# Patient Record
Sex: Female | Born: 1950 | Race: Black or African American | Hispanic: No | Marital: Married | Smoking: Never smoker
Health system: Southern US, Community
[De-identification: ages and names within clinical notes are randomized; demographics above are authoritative.]

## PROBLEM LIST (undated history)

## (undated) DIAGNOSIS — I10 Essential (primary) hypertension: Secondary | ICD-10-CM

## (undated) DIAGNOSIS — G4730 Sleep apnea, unspecified: Secondary | ICD-10-CM

## (undated) DIAGNOSIS — J3489 Other specified disorders of nose and nasal sinuses: Secondary | ICD-10-CM

## (undated) DIAGNOSIS — J40 Bronchitis, not specified as acute or chronic: Secondary | ICD-10-CM

## (undated) DIAGNOSIS — R059 Cough, unspecified: Secondary | ICD-10-CM

## (undated) DIAGNOSIS — N189 Chronic kidney disease, unspecified: Secondary | ICD-10-CM

## (undated) DIAGNOSIS — E119 Type 2 diabetes mellitus without complications: Secondary | ICD-10-CM

## (undated) DIAGNOSIS — J309 Allergic rhinitis, unspecified: Secondary | ICD-10-CM

## (undated) DIAGNOSIS — H269 Unspecified cataract: Secondary | ICD-10-CM

## (undated) DIAGNOSIS — E559 Vitamin D deficiency, unspecified: Secondary | ICD-10-CM

## (undated) DIAGNOSIS — R05 Cough: Secondary | ICD-10-CM

## (undated) DIAGNOSIS — M1990 Unspecified osteoarthritis, unspecified site: Secondary | ICD-10-CM

## (undated) DIAGNOSIS — R202 Paresthesia of skin: Secondary | ICD-10-CM

## (undated) DIAGNOSIS — IMO0001 Reserved for inherently not codable concepts without codable children: Secondary | ICD-10-CM

## (undated) DIAGNOSIS — M810 Age-related osteoporosis without current pathological fracture: Secondary | ICD-10-CM

## (undated) DIAGNOSIS — M159 Polyosteoarthritis, unspecified: Secondary | ICD-10-CM

## (undated) DIAGNOSIS — R350 Frequency of micturition: Secondary | ICD-10-CM

## (undated) HISTORY — DX: Unspecified osteoarthritis, unspecified site: M19.90

## (undated) HISTORY — DX: Other specified disorders of nose and nasal sinuses: J34.89

## (undated) HISTORY — DX: Vitamin D deficiency, unspecified: E55.9

## (undated) HISTORY — PX: ABDOMINAL HYSTERECTOMY: SHX81

## (undated) HISTORY — DX: Allergic rhinitis, unspecified: J30.9

## (undated) HISTORY — DX: Type 2 diabetes mellitus without complications: E11.9

## (undated) HISTORY — DX: Chronic kidney disease, unspecified: N18.9

## (undated) HISTORY — DX: Essential (primary) hypertension: I10

## (undated) HISTORY — DX: Polyosteoarthritis, unspecified: M15.9

## (undated) HISTORY — DX: Age-related osteoporosis without current pathological fracture: M81.0

---

## 2007-04-19 HISTORY — PX: KNEE SURGERY: SHX244

## 2007-12-21 ENCOUNTER — Inpatient Hospital Stay (HOSPITAL_COMMUNITY): Admission: RE | Admit: 2007-12-21 | Discharge: 2007-12-26 | Payer: Self-pay | Admitting: Orthopedic Surgery

## 2008-01-17 ENCOUNTER — Ambulatory Visit: Payer: Self-pay | Admitting: Vascular Surgery

## 2008-01-17 ENCOUNTER — Encounter (INDEPENDENT_AMBULATORY_CARE_PROVIDER_SITE_OTHER): Payer: Self-pay | Admitting: Orthopedic Surgery

## 2008-01-17 ENCOUNTER — Ambulatory Visit (HOSPITAL_COMMUNITY): Admission: RE | Admit: 2008-01-17 | Discharge: 2008-01-17 | Payer: Self-pay | Admitting: Orthopedic Surgery

## 2010-02-06 ENCOUNTER — Encounter: Admission: RE | Admit: 2010-02-06 | Discharge: 2010-02-06 | Payer: Self-pay | Admitting: Orthopedic Surgery

## 2010-08-31 NOTE — Op Note (Signed)
NAMELAZARIAH, SAVARD NO.:  [id number]   MEDICAL RECORD NO.:  [id number]          PATIENT TYPE:  INP   LOCATION:  5040                         FACILITY:  [facility]   PHYSICIAN:  Dyke Brackett, M.D.    DATE OF BIRTH:  Dec 08, 1950   DATE OF PROCEDURE:  12/21/2007  DATE OF DISCHARGE:                               OPERATIVE REPORT   INDICATIONS:  A 60 year old with end-stage arthritis, left knee, not  responding to conservative treatment, thought to be amenable to  hospitalization and left total knee replacement.   PREOPERATIVE DIAGNOSIS:  Osteoarthritis, left knee.   POSTOPERATIVE DIAGNOSIS:  Osteoarthritis, left knee.   OPERATIONS:  1. Left total knee replacement (LCS cemented standard plus size 4      tibia, 50-mm bearing, 3 peg patella).  2. Open lateral release off the left knee.   SURGEON:  Dyke Brackett, MD   ASSISTANT:  Sharol Given, PA   TOURNIQUET TIME:  1 hour 30 minutes.   DESCRIPTION OF PROCEDURE:  After sterile prep and drape, exsanguination  of the leg, inflation to 400 mmHg, straight skin incision a medial  parapatellar approach to the right knee made stripping of the medial  side due to varus knee.  The tibia was cut about 2-3 mm below the most  diseased medial compartment which released the PCL followed by an  anterior and posterior femoral cut with a flexion gap measured at 15 mm  with a  4-degree valgus.  Distal femoral cut measured again with an  extension gap of 15 mm.  The chamfer cuts were made as well with again  excision of the remnants of the menisci medially and laterally and  further release of the PCL.  Attention was next directed the tibia.  The  tibia keel cut was made with standard technique.  The trial was placed  on the tibia followed by femur.  Patella was cut leaving about 13 mm of  native patella.  Trial reduction carried out with full extension noted.  Good improvement in relative to both flexion contraction noted.  Good  stability to varus and valgus stress testing.  Trial components were  removed followed by copious irrigation.  Final components were inserted  with 1.2 grams of tobramycin per batch of cement.  With cement allowed  to harden, trial bearing was then placed.  Trial bearing was removed.  Tourniquet was released.  No excessive bleeding was noted from the  posterior aspect of the knee, but multiple bits of the cement were  removed.  Final components were inserted including the final bearing and  final reduction and then again full extension.  No instability.  Good  stability to the varus and valgus stress test.  Patella, however, did  seem to track laterally, so an open lateral release was carried out  which improved the tracking  significantly and then closure of the capsule was accomplished with #1  Ethibond, 2-0 Vicryl, and skin clips.  Marcaine with epinephrine in the  skin as well as into the joint.  The patient was taken to the recovery  room  in stable condition.      Dyke Brackett, M.D.  Electronically Signed     WDC/MEDQ  D:  12/21/2007  T:  12/22/2007  Job:  [id number]

## 2010-08-31 NOTE — Discharge Summary (Signed)
NAMESAMIYYAH, Leon             ACCOUNT NO.:  [id number]   MEDICAL RECORD NO.:  [id number]          PATIENT TYPE:  INP   LOCATION:  5040                         FACILITY:  [facility]   PHYSICIAN:  Dyke Brackett, M.D.    DATE OF BIRTH:  Oct 29, 1950   DATE OF ADMISSION:  12/21/2007  DATE OF DISCHARGE:                               DISCHARGE SUMMARY   ADMITTING DIAGNOSIS:  Left knee osteoarthritis.   DISCHARGE DIAGNOSIS:  Left knee osteoarthritis.   PROCEDURE:  Left total knee arthroplasty.   SURGEON:  Dr. Madelon Lips.   ASSISTANT:  Sharol Given, PA-C.   Emily Leon came in on December 21, 2007, with left knee osteoarthritis  ongoing, failed conservative treatment and wished to proceed forward  with left total knee replacement which was done in the operating room  and she was transferred to the PACU in stable condition.   HOSPITAL COURSE:  Postoperative day #1 was on December 22, 2007.  The  patient did well, pain controlled slow with therapy.  Vitals were  stable.  Hemoglobin 11.0 and hematocrit 32.8.  She was not having any  calf problems and was able to get rid of the PCA.  The patient was also  consulted by internal medicine on December 22, 2007, due to poorly  controlled blood sugar in the past as her hemoglobin A1c was 10.6 coming  in, which was controlled by internal medicine and helped control her  diabetes.  It was also noted she had some acute renal insufficiency,  which they thought was secondary to dehydration, which they helped out  with controlling.  The patient's progress was slow, so they suggested  skilled nursing facility.  Discussed with the patient and she agreed.  December 23, 2007, was postoperative day #2, and the patient was doing  well.  Hemoglobin 11.0, hematocrit 32.8 from the prior day as they did  not have the new hemoglobin and hematocrit at that point.  Medicine had  been following the patient.  No significant drainage from her wound that  was  __________  from her left knee.  The patient otherwise doing well  except slow with therapy.  Postoperative day #3 was on December 24, 2007.  The patient was still slow with therapy.  As noted, she was  agreeable to skilled nursing facility.  Labs were stable.  Hemoglobin  was 9.6, hematocrit was 28.4.  Still working strong with therapies, but  still slow.  Plan on discharging to skilled nursing facility.  General  medicine noted that the diabetes was well controlled and hypertension  was stable.  Postoperative day #4 was on December 25, 2007.  The  patient hemoglobin was down to 8.6.  She states she had some problems  with dizziness when she stood up.  I talked her today also about her  blood pressure being slightly low at 90/60 and suggested a blood  transfusion.  Therefore, we decide to transfuse 2 units of packed red  blood cells.  The patient tolerated it well.  Then suggested transfer to  the skilled nursing facility when available.  Discharge note  given.  Plans for discharge to skilled nursing facility either on December 25, 2007 or on December 26, 2007, when a bed available.  Examination on  December 25, 2007, the patient still had not had a bowel movement, but  passing gas and doing well.  Vitals overall were stable.  The wound was  clean with no known drainage and left lower extremity neurovascularly  intact.  Not a strong amount of knee flexion and extension and the  patient was having difficulty with the pain in her knee.   DISCHARGE MEDICATIONS:  1. Apidra insulin pen where she was getting  18 units subcu at lunch      and dinner.  2. Lantus insulin, she is getting 50 units subcu at bedtime.  3. Hydrochlorothiazide 25 mg 1/2 tablet per day.  4. Vytorin 10/80 mg 1 tablet per day.  5. Actos 30 mg 1 tablet daily.  6. Glimepiride 4 mg 1 tablet daily.  7. Atenolol 100 mg 1 tablet daily.  8. Lotrel 10/40 mg 1 tablet daily.  9. Percocet 5/325 one-to-two tablets p.o. q.4-6  h., p.r.n. pain.  10.Robaxin 500 mg 1 tablet q.6-8 h., p.r.n. pain, muscle spasm.  11.Lovenox 40 mg subcu at 8 a.m. for another 10 days.  Plan for      patient to be discharged on Lovenox with the last day being on      January 04, 2008.   In addition of assessment/plan and discharge summary; Emily Leon is 4 days  postop left total knee replacement, going slow with therapy, so  transferred to skilled nursing facility.  She will be CPM as much as 0  to 90, if not at 90 degrees, increase 3-5 degrees per day.  The patient  50% weightbearing left lower extremity.  Diet will be diabetic diet.   ALLERGIES:  1. PENICILLIN.  2. SULFA.   The patient in stable condition on transfer.  Plan for 14 days postop  for staple removal and expect her for recheck in the office in about 2  weeks from discharge, which will be right around January 09, 2008.  The patient's staples should be removed approximately on January 04, 2008, from her left knee.  Please notify the office for any temperature  over 101 degrees or any signs of infection involving her left knee.      Sharol Given, PA      Dyke Brackett, M.D.  Electronically Signed    JBS/MEDQ  D:  12/25/2007  T:  12/25/2007  Job:  [id number]

## 2010-08-31 NOTE — Consult Note (Signed)
Emily Leon, Emily Leon             ACCOUNT NO.:  [id number]   MEDICAL RECORD NO.:  [id number]          PATIENT TYPE:  INP   LOCATION:  5040                         FACILITY:  [facility]   PHYSICIAN:  Renee Ramus, MD       DATE OF BIRTH:  02-11-1951   DATE OF CONSULTATION:  12/21/2007  DATE OF DISCHARGE:                                 CONSULTATION   HISTORY OF PRESENT ILLNESS:  The patient is a 60 year old female  admitted for left TKA secondary to advancing osteoarthritis.  We are  called to see the patient secondary to management of diabetes, high  blood pressure, and protracted nausea.   PAST MEDICAL HISTORY:  1. Advancing osteoarthritis.  2. Obesity.  3. Diabetes.  4. Hypertension.  5. Hyperlipidemia.   ALLERGIES:  PENICILLIN and SULFA.   CURRENT MEDICATIONS:  1. Norvasc.  2. Tenormin.  3. Benazepril.  4. Vytorin.  5. Amaryl.  6. Hydrochlorothiazide.  7. Dilaudid.  8. Aspart.  9. Lantus.   FAMILY HISTORY:  Not available.   SOCIAL HISTORY:  The patient is married, lives with her husband.  She  has no alcohol or tobacco history.   REVIEW OF SYSTEMS:  All other comprehensive review of systems are  negative.   PHYSICAL EXAMINATION:  GENERAL:  Well-developed, well-nourished,  somewhat obese [place] American female.  VITAL SIGNS:  Currently stable.  Temperature 98.7, pulse 68, respiratory  rate 18, blood pressure 150/72.  HEENT:  No jugular venous distention.  No lymphadenopathy.  Oropharynx  is clear.  Mucous membranes pink and moist.  TMs  clear bilaterally.  Pupils equal and reactive to light and accommodation.  Extraocular  movements were intact.  CARDIOVASCULAR:  Regular rate and rhythm without murmur rubs or gallops.  PULMONARY:  Lungs are clear to auscultation bilaterally.  ABDOMEN:  Soft, nontender, and nondistended without hepatosplenomegaly.  Bowel sounds are present.  She has no rebound or guarding.  EXTREMITIES:  She has no clubbing, cyanosis or edema.   Her left leg is  mobilized, somewhat small, but incision is clean, dry, and intact.  NEUROLOGIC:  Cranial nerves II-XII are grossly intact.  She has no focal  neurological deficits.   LABORATORY DATA:  Sodium 135, potassium 3.7, chloride 99, bicarb 28, BUN  6, creatinine 1.0, and glucose 206.  White count 7.9, H&H 13 and 40, MCV  90, and platelets 244.   ASSESSMENT AND PLAN:  1. Status post total knee arthroplasty per Ortho.  2. Protracted nausea.  We will treat with Zofran initially and monitor      for results.  3. Diabetes mellitus.  Place the patient on sliding scale insulin and      continue Lantus and aspart.  4. Hypertension.  We will monitor vital signs and adjust medications      as needed.  5. Hyperlipidemia.  Currently stable.  Continue statin therapy.  6. Disposition.  We will continue to follow the patient with you.   Consult note was constructed by reviewing past medical history  conferring with the orthopedic surgeon and reviewing the current medical  record.  Time spent  1.5 hours.      Renee Ramus, MD  Electronically Signed     JF/MEDQ  D:  12/21/2007  T:  12/22/2007  Job:  [id number]

## 2010-10-26 ENCOUNTER — Other Ambulatory Visit: Payer: Self-pay | Admitting: Orthopedic Surgery

## 2010-10-26 DIAGNOSIS — M25511 Pain in right shoulder: Secondary | ICD-10-CM

## 2010-11-03 ENCOUNTER — Other Ambulatory Visit: Payer: Self-pay

## 2010-11-13 ENCOUNTER — Ambulatory Visit
Admission: RE | Admit: 2010-11-13 | Discharge: 2010-11-13 | Disposition: A | Discharge status: Home / Self Care | Payer: Medicare Other | Source: Ambulatory Visit | Attending: Orthopedic Surgery | Admitting: Orthopedic Surgery

## 2010-11-13 DIAGNOSIS — M25511 Pain in right shoulder: Secondary | ICD-10-CM

## 2011-01-19 LAB — GLUCOSE, CAPILLARY
Glucose-Capillary: 100 — ABNORMAL HIGH
Glucose-Capillary: 102 — ABNORMAL HIGH
Glucose-Capillary: 102 — ABNORMAL HIGH
Glucose-Capillary: 153 — ABNORMAL HIGH
Glucose-Capillary: 155 — ABNORMAL HIGH
Glucose-Capillary: 195 — ABNORMAL HIGH
Glucose-Capillary: 197 — ABNORMAL HIGH
Glucose-Capillary: 207 — ABNORMAL HIGH
Glucose-Capillary: 258 — ABNORMAL HIGH
Glucose-Capillary: 282 — ABNORMAL HIGH
Glucose-Capillary: 82
Glucose-Capillary: 98

## 2011-01-19 LAB — CROSSMATCH: ABO/RH(D): O POS

## 2011-01-19 LAB — CBC
HCT: 25.5 — ABNORMAL LOW
Hemoglobin: 11 — ABNORMAL LOW
Hemoglobin: 9.6 — ABNORMAL LOW
MCHC: 32.7
MCHC: 33.6
MCHC: 33.9
MCV: 90.3
Platelets: 203
Platelets: 239
Platelets: 259
RBC: 3.47 — ABNORMAL LOW
RDW: 13.6
RDW: 13.9
RDW: 14.1
WBC: 9.1

## 2011-01-19 LAB — BASIC METABOLIC PANEL
BUN: 10
BUN: 13
BUN: 22
CO2: 26
CO2: 30
Calcium: 8.2 — ABNORMAL LOW
Calcium: 8.5
Chloride: 99
Creatinine, Ser: 1.69 — ABNORMAL HIGH
GFR calc Af Amer: >60
GFR calc non Af Amer: 59 — ABNORMAL LOW
Glucose, Bld: 177 — ABNORMAL HIGH
Glucose, Bld: 89
Potassium: 3.3 — ABNORMAL LOW
Sodium: 136
Sodium: 139

## 2011-01-19 LAB — COMPREHENSIVE METABOLIC PANEL
AST: 47 — ABNORMAL HIGH
Albumin: 3 — ABNORMAL LOW
Alkaline Phosphatase: 74
BUN: 16
CO2: 27
Chloride: 100
Creatinine, Ser: 2.31 — ABNORMAL HIGH
GFR calc non Af Amer: 22 — ABNORMAL LOW
Potassium: 4.2
Sodium: 137
Total Bilirubin: 0.6

## 2011-01-19 LAB — HEMOGLOBIN AND HEMATOCRIT, BLOOD
HCT: 30.9 — ABNORMAL LOW
Hemoglobin: 10.6 — ABNORMAL LOW

## 2013-11-14 ENCOUNTER — Encounter (INDEPENDENT_AMBULATORY_CARE_PROVIDER_SITE_OTHER): Payer: Self-pay | Admitting: General Surgery

## 2013-11-14 ENCOUNTER — Ambulatory Visit (INDEPENDENT_AMBULATORY_CARE_PROVIDER_SITE_OTHER): Payer: BC Managed Care – PPO | Admitting: General Surgery

## 2013-11-14 ENCOUNTER — Other Ambulatory Visit (INDEPENDENT_AMBULATORY_CARE_PROVIDER_SITE_OTHER): Payer: Self-pay | Admitting: General Surgery

## 2013-11-14 VITALS — BP 127/79 | HR 82 | Temp 98.2°F | Resp 16 | Ht 69.0 in | Wt 307.4 lb

## 2013-11-14 DIAGNOSIS — E1165 Type 2 diabetes mellitus with hyperglycemia: Secondary | ICD-10-CM

## 2013-11-14 DIAGNOSIS — IMO0001 Reserved for inherently not codable concepts without codable children: Secondary | ICD-10-CM

## 2013-11-14 NOTE — Progress Notes (Signed)
Patient ID: Emily Leon, female   DOB: 1950-06-08, 63 y.o.   MRN: [id number]  Chief Complaint  Patient presents with  . Bariatric Follow Up    HPI Emily Leon is a 63 y.o. female.   HPI 63 yo morbidly obese AAF referred by Alinda DeemJANNACH,STEPHEN, MD for evaluation of weight loss surgery.  The patient has struggled with her weight for many years. Despite several attempts for sustained weight loss she has been unsuccessful. She has tried Weight Watchers on several occasions, low carbohydrate diets, Slim fast as well as working out-all without any long-term success.  She is most interested in improving her diabetes mellitus thru weight loss surgery. She is debating between the laparoscopic adjustable gastric band versus a sleeve gastrectomy. She attended our in person seminar.  Her diabetes is managed by [place]. [place] Past Medical History  Diagnosis Date  . Arthritis   . Diabetes mellitus without complication   . Hypertension   . Chronic kidney disease     Past Surgical History  Procedure Laterality Date  . Knee surgery Left 2009    History reviewed. No pertinent family history.  Social History History  Substance Use Topics  . Smoking status: Never Smoker   . Smokeless tobacco: Not on file  . Alcohol Use: No    Allergies  Allergen Reactions  . Penicillins   . Sulfa Antibiotics     Current Outpatient Prescriptions  Medication Sig Dispense Refill  . amLODipine-atorvastatin (CADUET) 10-40 MG per tablet Take 1 tablet by mouth daily.      Marland Kitchen. aspirin 81 MG tablet Take 81 mg by mouth daily.      Marland Kitchen. atenolol (TENORMIN) 100 MG tablet Take 100 mg by mouth daily.      Marland Kitchen. ezetimibe-simvastatin (VYTORIN) 10-40 MG per tablet Take 1 tablet by mouth daily.      . hydrochlorothiazide (HYDRODIURIL) 25 MG tablet Take 25 mg by mouth daily.      . insulin aspart (NOVOLOG) 100 UNIT/ML injection Inject into the skin 3 (three) times daily before meals.      . insulin glargine  (LANTUS) 100 UNIT/ML injection Inject into the skin at bedtime.       No current facility-administered medications for this visit.    Review of Systems Review of Systems  Constitutional: Negative for fever, activity change, appetite change and unexpected weight change.  HENT: Negative for nosebleeds and trouble swallowing.   Eyes: Negative for photophobia and visual disturbance.  Respiratory: Negative for chest tightness and shortness of breath.        +OSA on CPAP, epworth score 9  Cardiovascular: Negative for chest pain and leg swelling (some Left ankle edema).       Denies CP, SOB, orthopnea, PND, some DOE  Gastrointestinal: Negative for nausea, vomiting, abdominal pain, diarrhea and constipation.       Denies reflux, reports normal colonoscopy  Endocrine:       Reports being diabetic for at least 30 years  Genitourinary: Negative for dysuria and difficulty urinating.       Vaginal hysterectomy  Musculoskeletal: Negative for arthralgias.       Bilateral knee pain  Skin: Negative for pallor and rash.  Neurological: Negative for dizziness, seizures, facial asymmetry and numbness.       Denies TIA and amaurosis fugax; remote history of migraines   Hematological: Negative for adenopathy. Does not bruise/bleed easily.  Psychiatric/Behavioral: Negative for behavioral problems and agitation.    Blood pressure 127/79, pulse  82, temperature 98.2 F (36.8 C), temperature source Oral, resp. rate 16, height 5\' 9"  (1.753 m), weight 307 lb 6.4 oz (139.436 kg).  Physical Exam Physical Exam  Vitals reviewed. Constitutional: She is oriented to person, place, and time. She appears well-developed and well-nourished. No distress.  Morbidly obese, central obesity  HENT:  Head: Normocephalic and atraumatic.  Right Ear: External ear normal.  Left Ear: External ear normal.  Eyes: Conjunctivae are normal. No scleral icterus.  Neck: Normal range of motion. Neck supple. No tracheal deviation  present. No thyromegaly present.  Cardiovascular: Normal rate and normal heart sounds.   Pulmonary/Chest: Effort normal and breath sounds normal. No stridor. No respiratory distress. She has no wheezes.  Abdominal: Soft. She exhibits no distension. There is no tenderness. There is no rebound.  Musculoskeletal: She exhibits no edema and no tenderness.  Lymphadenopathy:    She has no cervical adenopathy.  Neurological: She is alert and oriented to person, place, and time. She exhibits normal muscle tone.  Skin: Skin is warm and dry. No rash noted. She is not diaphoretic. No erythema.  Psychiatric: She has a normal mood and affect. Her behavior is normal. Judgment and thought content normal.    Data Reviewed None available  Assessment    Morbid obesity BMI 45.39 Obstructive sleep apnea on CPAP Hypertension Hyperlipidemia Osteoarthritis with bilateral knee pain Insulin-dependent diabetes mellitus     Plan    The patient meets weight loss surgery criteria. I think the patient would be an acceptable candidate for Laparoscopic vertical sleeve gastrectomy or laparoscopic adjustable gastric band procedure.   We discussed laparoscopic sleeve gastrectomy. We discussed the preoperative, operative and postoperative process. Using diagrams, I explained the surgery in detail including the performance of an EGD near the end of the surgery and an Upper GI swallow study on POD 1. We discussed the typical hospital course including a 2-3 day stay baring any complications.   The patient was given educational material. I quoted the patient that most patients can lose up to 50-70% of their excess weight. We did discuss the possibility of weight regain several years after the procedure.  The risks of infection, bleeding, pain, scarring, weight regain, too little or too much weight loss, vitamin deficiencies and need for lifelong vitamin supplementation, hair loss, need for protein supplementation, leaks,  stricture, reflux, food intolerance, gallstone formation, hernia, need for reoperation and conversion to roux Y gastric bypass, need for open surgery, injury to spleen or surrounding structures, DVT's, PE, and death again discussed with the patient and the patient expressed understanding and desires to proceed with laparoscopic vertical sleeve gastrectomy, possible open, intraoperative endoscopy.  We then discussed laparoscopic adjustable gastric banding. The patient was given Agricultural engineer. We discussed the risk and benefits of surgery including but not limited to bleeding, infection, injury to surrounding structures, blood clot formation such as deep venous thrombosis or pulmonary embolism, need to convert to an open procedure, band slippage, band erosion, failure to loose weight, port complications (leak or flippage), potential need for reoperative surgery, esophageal dilatation, worsening reflux, and vitamin deficiencies.  We also discussed the typical postoperative course with a laparoscopic adjustable gastric band and the need for frequent postoperative visits to assess the volume status of the band.  We discussed the typical expected weight loss with a laparoscopic adjustable gastric band. I explained to the patient that they can expect to lose 40-60% of their excess body weight if they are compliant with their postoperative instructions. However  I did explain that some patients loose less than 40% and some patients lose more than 60% of their excess body weight.  I explained that the likelihood of improvement in their obesity is fair with LAGB.  We discussed that before and after either surgery that there would be an alteration in their diet. I explained that we have put them on a diet 2 weeks before surgery. I also explained that they would be on a liquid diet for 2 weeks after surgery. We discussed that they would have to avoid certain foods after surgery. We discussed the importance of physical  activity as well as compliance with our dietary and supplement recommendations and routine follow-up.  I explained that if her primary goal is to improve her diabetes that more than likely she would be best served by a sleeve gastrectomy. I explained that we did not have to make a decision today. I wanted her to go home and process that information we discussed as well as read over the material I was providing her today.  I explained to the patient that we will start our evaluation process which includes labs, Upper GI to evaluate stomach and swallowing anatomy, nutritionist consultation, psychiatrist consultation, EKG, CXR, abdominal ultrasound.  The patient was given EMMI video access on sleeve gastrectomy & LAGB. Since she does not have Internet access, we will try to arrange for her to view the videos when she goes for her nutrition consultation at the hospital  [name]. [name], MD, FACS General, Bariatric, & Minimally Invasive Surgery [facility]  Note: This dictation was prepared with Dragon/digital dictation along with [facility] technology. Any transcriptional errors that result from this process are unintentional.          [name] 11/14/2013, 7:33 PM

## 2013-11-14 NOTE — Patient Instructions (Signed)
Congratulations on starting your journey to a healthier life! Over the next few weeks you will be undergoing tests (x-rays and labs) and seeing specialists to help evaluate you for weight loss surgery.  These tests and consultations with a psychologist and nutritionist are needed to prepare you for the lifestyle changes that lie ahead and are often required by insurance companies to approve you for surgery.   Pathway to Surgery:  Over the next few weeks -->Lab work -->Radiology tests   - Chest x-ray - make sure your lungs are normal before surgery  - Upper GI - you drink barium and pictures are taken as it travels down your  esophagus and into your stomach - looks for reflux and a hiatal hernia which may  need to repaired at the same time as your weight loss surgery  - Abdominal Ultrasound - looks at your gallbladder and liver  - Mammogram - up to date mammogram if you are a female -->EKG  -->Sleep study - if you are felt to be at high risk for obstructive sleep apnea -->H. Pylori breath test ([employer]) - you surgeon may order this test to see if you have  a bacteria (H pylori) in your stomach which makes you at higher risk to develop a  ulcer or inflammation of your stomach -->Nutrition consultation -->Psychologist consultation -->Other specialist consults - your surgeon may determine that you need to see a  specialist like a cardiologist or pulmnologist depending on your health history -->Watch EMMI video about your planned weight loss surgery (we will try to coordinate this when you go to [facility] for your nutrition consult) -->AttenWonda Oldsd the support group meeting at [facility] -->you can look at [email or web address] to learn more about weight loss surgery and  compare surgery outcomes -->you can look at our new website - [email or web address] - available mid-August 2015  Two weeks prior to surgery  Go on the extremely low carb liquid diet - this will decrease the size of your liver  which will  make surgery safer - the nutritionist will go over this at a later date  Attend preoperative appointment with your surgeon  Attend preoperative surgery class  One week prior to surgery  No aspirin products.  Tylenol is acceptable   24 hours prior to surgery  No alcoholic beverages  Report fever greater than 100.5 or excessive nasal drainage suggesting infection  Continue bariatric preop diet  Perform bowel prep if ordered  Do not eat or drink anything after midnight the night before surgery  Do not take any medications except those instructed by the anesthesiologist  Morning of surgery  Please arrive at the hospital at least 2 hours before your scheduled surgery time.  No makeup, fingernail polish or jewelry  Bring insurance cards with you  Bring your CPAP mask if you use this

## 2013-12-19 ENCOUNTER — Ambulatory Visit (HOSPITAL_COMMUNITY)
Admission: RE | Admit: 2013-12-19 | Discharge: 2013-12-19 | Disposition: A | Discharge status: Home / Self Care | Payer: Medicare Other | Source: Ambulatory Visit | Attending: General Surgery | Admitting: General Surgery

## 2013-12-19 ENCOUNTER — Ambulatory Visit (HOSPITAL_COMMUNITY): Payer: Medicare Other

## 2013-12-19 ENCOUNTER — Other Ambulatory Visit (INDEPENDENT_AMBULATORY_CARE_PROVIDER_SITE_OTHER): Payer: Self-pay | Admitting: General Surgery

## 2013-12-19 ENCOUNTER — Other Ambulatory Visit (HOSPITAL_COMMUNITY): Payer: Medicare Other

## 2013-12-19 DIAGNOSIS — K7689 Other specified diseases of liver: Secondary | ICD-10-CM | POA: Insufficient documentation

## 2013-12-19 DIAGNOSIS — Z1231 Encounter for screening mammogram for malignant neoplasm of breast: Secondary | ICD-10-CM | POA: Diagnosis not present

## 2013-12-19 DIAGNOSIS — Z6841 Body Mass Index (BMI) 40.0 and over, adult: Secondary | ICD-10-CM | POA: Diagnosis not present

## 2013-12-19 DIAGNOSIS — E1165 Type 2 diabetes mellitus with hyperglycemia: Secondary | ICD-10-CM

## 2013-12-19 DIAGNOSIS — K8020 Calculus of gallbladder without cholecystitis without obstruction: Secondary | ICD-10-CM | POA: Diagnosis not present

## 2013-12-19 DIAGNOSIS — G4733 Obstructive sleep apnea (adult) (pediatric): Secondary | ICD-10-CM | POA: Diagnosis not present

## 2013-12-19 DIAGNOSIS — E785 Hyperlipidemia, unspecified: Secondary | ICD-10-CM | POA: Diagnosis not present

## 2013-12-19 DIAGNOSIS — M129 Arthropathy, unspecified: Secondary | ICD-10-CM | POA: Diagnosis not present

## 2013-12-19 DIAGNOSIS — IMO0001 Reserved for inherently not codable concepts without codable children: Secondary | ICD-10-CM | POA: Diagnosis not present

## 2013-12-19 DIAGNOSIS — I517 Cardiomegaly: Secondary | ICD-10-CM | POA: Insufficient documentation

## 2013-12-19 DIAGNOSIS — I129 Hypertensive chronic kidney disease with stage 1 through stage 4 chronic kidney disease, or unspecified chronic kidney disease: Secondary | ICD-10-CM | POA: Diagnosis not present

## 2013-12-19 DIAGNOSIS — N189 Chronic kidney disease, unspecified: Secondary | ICD-10-CM | POA: Diagnosis not present

## 2013-12-26 ENCOUNTER — Encounter: Payer: Medicare Other | Attending: General Surgery | Admitting: Dietician

## 2013-12-26 ENCOUNTER — Encounter: Payer: Self-pay | Admitting: Dietician

## 2013-12-26 VITALS — Ht 69.0 in | Wt 307.2 lb

## 2013-12-26 DIAGNOSIS — Z6841 Body Mass Index (BMI) 40.0 and over, adult: Secondary | ICD-10-CM | POA: Insufficient documentation

## 2013-12-26 DIAGNOSIS — Z713 Dietary counseling and surveillance: Secondary | ICD-10-CM | POA: Insufficient documentation

## 2013-12-26 DIAGNOSIS — Z01818 Encounter for other preprocedural examination: Secondary | ICD-10-CM | POA: Diagnosis not present

## 2013-12-26 NOTE — Patient Instructions (Signed)
Follow Pre-Op Goals Try Protein Shakes Return for 6 Months of Supervised Weight Loss. Call [facility] at [phone] when surgery is scheduled to enroll in Pre-Op Class

## 2013-12-26 NOTE — Progress Notes (Signed)
  Pre-Op Assessment Visit:  Pre-Operative Sleeve Gastrectomy Surgery  Medical Nutrition Therapy:  Appt start time: 0910   End time:  0945.  Patient was seen on 12/26/2013 for Pre-Operative Sleeve Gastrectomy Nutrition Assessment. Assessment and letter of approval faxed to [facility] Surgery Bariatric Surgery Program coordinator on 12/26/2013.   Preferred Learning Style:   No preference indicated   Learning Readiness:   Ready  Handouts given during visit include:  Pre-Op Goals Bariatric Surgery Protein Shakes  Teaching Method Utilized:  Visual Auditory Hands on  Barriers to learning/adherence to lifestyle change: arthritis pain  Demonstrated degree of understanding via:  Teach Back   [facility] to call the [facility] to enroll in Pre-Op and Post-Op Nutrition Education when surgery date is scheduled.

## 2013-12-31 ENCOUNTER — Encounter: Payer: Medicare Other | Admitting: Dietician

## 2013-12-31 VITALS — Ht 69.0 in | Wt 308.8 lb

## 2013-12-31 DIAGNOSIS — Z01818 Encounter for other preprocedural examination: Secondary | ICD-10-CM | POA: Diagnosis not present

## 2013-12-31 NOTE — Patient Instructions (Signed)
Work on phasing out caffeine and sugar in drinks. Drink mostly water, decaf tea with Splenda, or crystal light. Keep using small plate for meals. Keep trying protein shakes. Keep walking 3 x week for 30 minutes and increase when you can.  Have protein with carbohydrates at the same time if you need a snack.

## 2013-12-31 NOTE — Progress Notes (Signed)
  6 Months Supervised Weight Loss Visit:   Pre-Operative Sleeve Gastrectomy Surgery  Medical Nutrition Therapy:  Appt start time: 1200 end time:  1230.  Primary concerns today: Supervised Weight Loss Visit. Tried a protein shake and thought it was ok. Also tried chewing good and tried to walk a little bit more. Has been eating the same way for awhile. Does not snack.   Blood sugar is still high. Will have doctor's appointment next month to talk about blood sugar/insulin.   Weight: 308.8 lbs BMI: 45.6  24-hr recall: B (AM): protein shake and fruit Snk (AM): none  L (PM): salad with grilled chicken, tomatoes, lettuce, carrots, and cucumbers Snk (PM): none  D (PM): baked chicken or string beans  Snk (PM): none Bev: 1-2 Mountain Dew per week and unsweet tea and water with crystal light  Medications:  See list  Recent physical activity:  Walk 3 days a week for 30 minutes  Progress Towards Goal(s):  In progress.   Nutritional Diagnosis:  [place]-3.3 Overweight/obesity related to past poor dietary habits and physical inactivity as evidenced by patient plans for Sleeve Gastrectomy surgery following dietary guidelines for continued weight loss.    Intervention:  Nutrition counseling provided. Plan: Work on phasing out caffeine and sugar in drinks. Drink mostly water, decaf tea with Splenda, or crystal light. Keep using small plate for meals. Keep trying protein shakes. Keep walking 3 x week for 30 minutes and increase when you can.  Have protein with carbohydrates at the same time if you need a snack.   Handouts given out during visit 15 g CHO Snacks  Monitoring/Evaluation:  Dietary intake, exercise, and body weight. Follow up in 1 months for 6 month supervised weight loss visit.

## 2014-01-23 ENCOUNTER — Encounter: Payer: Medicare Other | Attending: General Surgery | Admitting: Dietician

## 2014-01-23 VITALS — Ht 69.0 in | Wt 309.5 lb

## 2014-01-23 DIAGNOSIS — Z6841 Body Mass Index (BMI) 40.0 and over, adult: Secondary | ICD-10-CM | POA: Diagnosis not present

## 2014-01-23 DIAGNOSIS — Z713 Dietary counseling and surveillance: Secondary | ICD-10-CM | POA: Insufficient documentation

## 2014-01-23 NOTE — Progress Notes (Signed)
  6 Months Supervised Weight Loss Visit:   Pre-Operative Sleeve Gastrectomy Surgery  Medical Nutrition Therapy:  Appt start time: 1200 end time:  1230.  Primary concerns today: Supervised Weight Loss Visit #2. Returns with no change in weight. States things are about the same. Tried a [employer] protein shake. Reminded her of the protein shakes requirements. Still trying to chew foods well. Still walking 3 x week.   Blood sugar is still high and will be seeing doctor next week.   Blood sugar is still high. Will have doctor's appointment next month to talk about blood sugar/insulin.   Weight: 308.8 lbs BMI: 45.6  24-hr recall: B (AM): protein shake and fruit Snk (AM): none  L (PM): salad with grilled chicken, tomatoes, lettuce, carrots, and cucumbers Snk (PM): none  D (PM): baked chicken or string beans  Snk (PM): none Bev: 1-2 Mountain Dew per week and unsweet tea and water with crystal light  Medications:  See list  Recent physical activity:  Walk 3 days a week for 30 minutes  Progress Towards Goal(s):  In progress.   Nutritional Diagnosis:  [place]-3.3 Overweight/obesity related to past poor dietary habits and physical inactivity as evidenced by patient plans for Sleeve Gastrectomy surgery following dietary guidelines for continued weight loss.    Intervention:  Nutrition counseling provided. Plan: Work on phasing out caffeine and sugar in drinks. Drink mostly water, decaf tea with Splenda, or crystal light. Keep using small plate for meals. Keep trying protein shakes. Keep walking 3 x week for 30 minutes and increase when you can.  Have protein with carbohydrates at the same time if you need a snack.   Handouts given out during visit 15 g CHO Snacks  Monitoring/Evaluation:  Dietary intake, exercise, and body weight. Follow up in 1 months for 6 month supervised weight loss visit.

## 2014-01-23 NOTE — Patient Instructions (Addendum)
Work on phasing out caffeine and sugar in drinks. Drink mostly water, decaf tea with Splenda, or crystal light. Keep working on chewing well.  Keep trying protein shakes. (More than 15 g of protein and less than 5 g of carbs).  Keep walking 3 x week for 30 minutes and increase when you can. Overall, try to increase activity.  Talk to doctor about pain in joints.  Have protein with carbohydrates at the same time if you need a snack.

## 2014-02-27 ENCOUNTER — Encounter: Payer: Medicare Other | Attending: General Surgery | Admitting: Dietician

## 2014-02-27 VITALS — Ht 69.0 in | Wt 308.1 lb

## 2014-02-27 DIAGNOSIS — Z713 Dietary counseling and surveillance: Secondary | ICD-10-CM | POA: Diagnosis not present

## 2014-02-27 DIAGNOSIS — Z6841 Body Mass Index (BMI) 40.0 and over, adult: Secondary | ICD-10-CM | POA: Diagnosis not present

## 2014-02-27 NOTE — Patient Instructions (Addendum)
Drink mostly water, decaf tea with Splenda, or crystal light. Keep working on chewing well.  Plan to walk 3 x week for 60 minutes. Overall, try to increase activity. (Ride the bike at at gym, abs, weights).  Have protein with carbohydrates at the same time if you need a snack.  For Thanksgiving, have Mal[place]key, ham, with vegetables with small piece of red velvet cake and water.

## 2014-02-27 NOTE — Progress Notes (Signed)
  6 Months Supervised Weight Loss Visit:   Pre-Operative Sleeve Gastrectomy Surgery  Medical Nutrition Therapy:  Appt start time: 1200 end time:  1215.  Primary concerns today: Supervised Weight Loss Visit #3. Returns with no change in weight. States things are about the same. Tried Isopure and did not like, though did like the [facility] Lean Shake and Atkins. No [facility] drinking [facility] or sweet tea.   Still trying to chew foods well and feels like it is going well. Still walking 3 x week.   Testing blood sugar 4 x day averages from 150-324 mg/dl. Was instructed on doing a sliding scale for her insulin. Hgb A1c was 10% last month which is coming down. Concerned that her sugar has been high. Having some low blood sugar (70 mg/dl) when she just had protein shakes at day.   Pain in joints is getting better. Takes some Tylenol sometimes for pain.   Weight: 308.1 lbs BMI: 45.6  24-hr recall: B (AM): protein shake and fruit Snk (AM): none  L (PM): salad with grilled chicken, tomatoes, lettuce, carrots, and cucumbers Snk (PM): none  D (PM): baked chicken or string beans  Snk (PM): none Bev: water with crystal light  Medications:  See list  Recent physical activity:  Walk 3 days a week for 30 minutes  Progress Towards Goal(s):  In progress.   Nutritional Diagnosis:  [place]-3.3 Overweight/obesity related to past poor dietary habits and physical inactivity as evidenced by patient plans for Sleeve Gastrectomy surgery following dietary guidelines for continued weight loss.    Intervention:  Nutrition counseling provided. Plan: Drink mostly water, decaf tea with Splenda, or crystal light. Keep working on chewing well.  Plan to walk 3 x week for 60 minutes. Overall, try to increase activity. (Ride the bike at at gym, abs, weights).  Have protein with carbohydrates at the same time if you need a snack.  For Thanksgiving, have [place], ham, with vegetables with small piece of red velvet cake and  water.   Handouts given out during visit 15 g CHO Snacks  Monitoring/Evaluation:  Dietary intake, exercise, and body weight. Follow up in 1 months for 6 month supervised weight loss visit.

## 2014-03-27 ENCOUNTER — Ambulatory Visit: Payer: Medicare Other | Admitting: Dietician

## 2014-03-31 ENCOUNTER — Encounter: Payer: Medicare Other | Attending: General Surgery | Admitting: Dietician

## 2014-03-31 VITALS — Ht 69.0 in | Wt 305.2 lb

## 2014-03-31 DIAGNOSIS — E119 Type 2 diabetes mellitus without complications: Secondary | ICD-10-CM | POA: Insufficient documentation

## 2014-03-31 DIAGNOSIS — Z6841 Body Mass Index (BMI) 40.0 and over, adult: Secondary | ICD-10-CM | POA: Diagnosis not present

## 2014-03-31 DIAGNOSIS — Z713 Dietary counseling and surveillance: Secondary | ICD-10-CM | POA: Insufficient documentation

## 2014-03-31 NOTE — Patient Instructions (Addendum)
Drink mostly water, decaf tea with Splenda, or crystal light. (Phase out caffeine, sugar, and carbonation.)  Keep working on not drinking during meals/snacks (wait 30 minutes to drink).  Overall, try to increase activity. (Ride the bike at at gym, abs, weights).

## 2014-03-31 NOTE — Progress Notes (Signed)
  6 Months Supervised Weight Loss Visit:   Pre-Operative Sleeve Gastrectomy Surgery  Medical Nutrition Therapy:  Appt start time: 315 end time:  330.  Primary concerns today: Supervised Weight Loss Visit #4. Returns with a 3 lbs weight loss. Has been sick since Thanksgiving. Having one protein shake per day. Testing blood sugar 3 x day averages from 150-201 mg/dl.  Overall doing well with Pre-Op goals.   Wt Readings from Last 3 Encounters:  03/31/14 305 lb 3.2 oz (138.438 kg)  02/27/14 308 lb 1.6 oz (139.753 kg)  01/23/14 309 lb 8 oz (140.388 kg)   Ht Readings from Last 3 Encounters:  03/31/14 5\' 9"  (1.753 m)  02/27/14 5\' 9"  (1.753 m)  01/23/14 5\' 9"  (1.753 m)   Body mass index is 45.05 kg/(m^2). @BMIFA @ Normalized weight-for-age data available only for age 24 to 20 years. Normalized stature-for-age data available only for age 24 to 20 years.  24-hr recall: B (AM): protein shake and fruit Snk (AM): none  L (PM): salad with grilled chicken, tomatoes, lettuce, carrots, and cucumbers Snk (PM): none  D (PM): baked chicken or string beans  Snk (PM): none Bev: water with crystal light  Medications:  See list  Recent physical activity:  Walk 3 days a week for 30-60 minutes, weights at the gym for 30 minutes  Progress Towards Goal(s):  In progress.   Nutritional Diagnosis:  [place]-3.3 Overweight/obesity related to past poor dietary habits and physical inactivity as evidenced by patient plans for Sleeve Gastrectomy surgery following dietary guidelines for continued weight loss.    Intervention:  Nutrition counseling provided. Plan: Drink mostly water, decaf tea with Splenda, or crystal light. (Phase out caffeine, sugar, and carbonation.)  Keep working on not drinking during meals/snacks (wait 30 minutes to drink).  Overall, try to increase activity. (Ride the bike at at gym, abs, weights).    Monitoring/Evaluation:  Dietary intake, exercise, and body weight. Follow up in 1 months for  6 month supervised weight loss visit.

## 2014-05-01 ENCOUNTER — Encounter: Payer: Medicare Other | Attending: General Surgery | Admitting: Dietician

## 2014-05-01 VITALS — Ht 69.0 in | Wt 306.7 lb

## 2014-05-01 DIAGNOSIS — Z6841 Body Mass Index (BMI) 40.0 and over, adult: Secondary | ICD-10-CM | POA: Diagnosis not present

## 2014-05-01 DIAGNOSIS — Z713 Dietary counseling and surveillance: Secondary | ICD-10-CM | POA: Insufficient documentation

## 2014-05-01 DIAGNOSIS — E119 Type 2 diabetes mellitus without complications: Secondary | ICD-10-CM | POA: Insufficient documentation

## 2014-05-01 NOTE — Patient Instructions (Addendum)
Drink mostly water, decaf tea with Splenda, or crystal light. (Phase out caffeine, sugar, and carbonation.)  Keep working on not drinking during meals/snacks (wait 30 minutes to drink).  Overall, try to increase activity. (Ride the bike at at gym, abs, weights). Try to increase endurance.   Try protein powder and mix with water, skim milk, or unsweetened soy or almond milk. Or try Premier Protein Shake (look by pharmacy).  Look for yogurt that has less than 12-15 g carbs per serving. (Dannon Light and Fit, Triple Zero).

## 2014-05-01 NOTE — Progress Notes (Signed)
  6 Months Supervised Weight Loss Visit:   Pre-Operative Sleeve Gastrectomy Surgery  Medical Nutrition Therapy:  Appt start time: 1200 end time:  1215.  Primary concerns today: Supervised Weight Loss Visit #5. Returns with a 1 lbs weight gain. Having one protein shake per day. Testing blood sugar 4 x day averages from 150-201 mg/dl.  Has been phasing out caffeine, carbonation, and sugar. Continuing to work on not eating during meals.  Overall doing well with Pre-Op goals.   Wt Readings from Last 3 Encounters:  05/01/14 306 lb 11.2 oz (139.118 kg)  03/31/14 305 lb 3.2 oz (138.438 kg)  02/27/14 308 lb 1.6 oz (139.753 kg)   Ht Readings from Last 3 Encounters:  05/01/14 5\' 9"  (1.753 m)  03/31/14 5\' 9"  (1.753 m)  02/27/14 5\' 9"  (1.753 m)   Body mass index is 45.27 kg/(m^2). @BMIFA @ Normalized weight-for-age data available only for age 82 to 20 years. Normalized stature-for-age data available only for age 82 to 20 years.  24-hr recall: B (AM): protein shake and fruit Snk (AM): none  L (PM): salad with grilled chicken, tomatoes, lettuce, carrots, and cucumbers Snk (PM): none  D (PM): baked chicken or string beans  Snk (PM): none Bev: water with crystal light  Medications:  See list  Recent physical activity:  Walk 3 days a week for 30-60 minutes, weights at the gym for 30 minutes  Progress Towards Goal(s):  In progress.   Nutritional Diagnosis:  [place]-3.3 Overweight/obesity related to past poor dietary habits and physical inactivity as evidenced by patient plans for Sleeve Gastrectomy surgery following dietary guidelines for continued weight loss.    Intervention:  Nutrition counseling provided. Plan: Drink mostly water, decaf tea with Splenda, or crystal light. (Phase out caffeine, sugar, and carbonation.)  Keep working on not drinking during meals/snacks (wait 30 minutes to drink).  Overall, try to increase activity. (Ride the bike at at gym, abs, weights). Try to increase  endurance.   Try protein powder and mix with water, skim milk, or unsweetened soy or almond milk. Or try Premier Protein Shake (look by pharmacy).  Look for yogurt that has less than 12-15 g carbs per serving. (Dannon Light and Fit, Triple Zero).    Monitoring/Evaluation:  Dietary intake, exercise, and body weight. Follow up in 1 months for 6 month supervised weight loss visit.

## 2014-05-26 ENCOUNTER — Encounter: Payer: Medicare Other | Attending: General Surgery | Admitting: Dietician

## 2014-05-26 VITALS — Ht 69.0 in | Wt 311.6 lb

## 2014-05-26 DIAGNOSIS — Z713 Dietary counseling and surveillance: Secondary | ICD-10-CM | POA: Diagnosis not present

## 2014-05-26 DIAGNOSIS — E119 Type 2 diabetes mellitus without complications: Secondary | ICD-10-CM | POA: Insufficient documentation

## 2014-05-26 DIAGNOSIS — Z6841 Body Mass Index (BMI) 40.0 and over, adult: Secondary | ICD-10-CM | POA: Diagnosis not present

## 2014-05-26 NOTE — Patient Instructions (Addendum)
Continue drinking mostly water, decaf tea with Splenda, or crystal light. (Phase out caffeine, sugar, and carbonation.)  Keep working on not drinking during meals/snacks (wait 30 minutes to drink).  Continue increasing activity. (Ride the bike at at gym, abs, weights). Try to increase endurance.   Look for yogurt that has less than 12-15 g carbs per serving. (Dannon Light and Fit, Triple Zero).  [facility] at [phone] when surgery is scheduled to enroll in Pre-Op Class

## 2014-05-26 NOTE — Progress Notes (Signed)
  6 Months Supervised Weight Loss Visit:   Pre-Operative Sleeve Gastrectomy Surgery  Medical Nutrition Therapy:  Appt start time: 1100 end time:  1115.  Primary concerns today: Supervised Weight Loss Visit #6. Returns with a 5 lbs weight gain. Has continued working on pre op goals. Would like to increase activity.   Testing blood sugar 4 x day averages from 150-201 mg/dl.  Wt Readings from Last 3 Encounters:  05/26/14 311 lb 9.6 oz (141.341 kg)  05/01/14 306 lb 11.2 oz (139.118 kg)  03/31/14 305 lb 3.2 oz (138.438 kg)   Ht Readings from Last 3 Encounters:  05/26/14 5\' 9"  (1.753 m)  05/01/14 5\' 9"  (1.753 m)  03/31/14 5\' 9"  (1.753 m)   Body mass index is 45.99 kg/(m^2). @BMIFA @ Normalized weight-for-age data available only for age 54 to 20 years. Normalized stature-for-age data available only for age 54 to 20 years.    24-hr recall: B (AM): protein shake and fruit Snk (AM): none  L (PM): salad with grilled chicken, tomatoes, lettuce, carrots, and cucumbers Snk (PM): none  D (PM): baked chicken or string beans  Snk (PM): none Bev: water with crystal light  Medications:  See list  Recent physical activity:  Walk 3 days a week for 30-60 minutes, weights at the gym for 30 minutes  Progress Towards Goal(s):  In progress.   Nutritional Diagnosis:  [place]-3.3 Overweight/obesity related to past poor dietary habits and physical inactivity as evidenced by patient plans for Sleeve Gastrectomy surgery following dietary guidelines for continued weight loss.    Intervention:  Nutrition counseling provided. Plan: Continue drinking mostly water, decaf tea with Splenda, or crystal light. (Phase out caffeine, sugar, and carbonation.)  Keep working on not drinking during meals/snacks (wait 30 minutes to drink).  Continue increasing activity. (Ride the bike at at gym, abs, weights). Try to increase endurance.   Look for yogurt that has less than 12-15 g carbs per serving. (Dannon Light and Fit,  Triple Zero).  [facility] at [phone] when surgery is scheduled to enroll in Pre-Op Class    Monitoring/Evaluation:  Dietary intake, exercise, and body weight. Follow up in 1 months for 6 month supervised weight loss visit.

## 2014-05-28 ENCOUNTER — Ambulatory Visit: Payer: Medicare Other | Admitting: Dietician

## 2014-05-29 ENCOUNTER — Ambulatory Visit: Payer: Medicare Other | Admitting: Dietician

## 2014-08-05 ENCOUNTER — Other Ambulatory Visit (INDEPENDENT_AMBULATORY_CARE_PROVIDER_SITE_OTHER): Payer: Self-pay

## 2014-08-11 ENCOUNTER — Ambulatory Visit: Payer: Self-pay | Admitting: General Surgery

## 2014-08-24 MED ORDER — LEVOFLOXACIN IN D5W 750 MG/150ML IV SOLN
750.0000 mg | INTRAVENOUS | Status: AC
  Filled 2014-08-24: qty 150

## 2014-08-25 ENCOUNTER — Ambulatory Visit (HOSPITAL_COMMUNITY)
Admission: RE | Admit: 2014-08-25 | Discharge: 2014-08-26 | Disposition: A | Discharge status: Home / Self Care | Payer: Medicare Other | Source: Ambulatory Visit | Attending: General Surgery | Admitting: General Surgery

## 2014-08-26 ENCOUNTER — Encounter (HOSPITAL_COMMUNITY)
Admission: RE | Disposition: A | Discharge status: Home / Self Care | Payer: Self-pay | Source: Ambulatory Visit | Attending: General Surgery

## 2014-08-26 HISTORY — PX: BREATH TEK H PYLORI: SHX5422

## 2014-08-26 SURGERY — BREATH TEST, FOR HELICOBACTER PYLORI

## 2014-08-26 NOTE — Progress Notes (Signed)
   08/26/14 1012  BREATH TEK ASSESSMENT  Referring MD Gaynelle AduEric Wilson  Time of Last PO Intake 2200  Baseline Breath At: 0745  Pranactin Given At: 0745  Post-Dose Breath At: 0800  Sample 1 3.8%  Sample 2 2.6%  Test Postive

## 2014-08-27 ENCOUNTER — Encounter (HOSPITAL_COMMUNITY): Payer: Self-pay | Admitting: General Surgery

## 2014-09-08 ENCOUNTER — Encounter: Payer: Medicare Other | Attending: General Surgery

## 2014-09-08 VITALS — Ht 69.0 in | Wt 320.5 lb

## 2014-09-08 DIAGNOSIS — Z6841 Body Mass Index (BMI) 40.0 and over, adult: Secondary | ICD-10-CM | POA: Insufficient documentation

## 2014-09-08 DIAGNOSIS — E119 Type 2 diabetes mellitus without complications: Secondary | ICD-10-CM | POA: Diagnosis not present

## 2014-09-08 DIAGNOSIS — Z713 Dietary counseling and surveillance: Secondary | ICD-10-CM | POA: Diagnosis not present

## 2014-09-09 NOTE — Progress Notes (Signed)
  Pre-Operative Nutrition Class:  Appt start time: 1612   End time:  1830.  Patient was seen on 09/08/14 for Pre-Operative Bariatric Surgery Education at [facility].   Surgery date: 09/29/14 Surgery type: RYGB [facility]: 307 lbs on 12/26/13 Weight today: 320.5 lbs  TANITA  BODY COMP RESULTS  09/08/14   BMI (kg/m^2) 47.3   Fat Mass (lbs) 169   Fat Free Mass (lbs) 151.5   Total Body Water (lbs) 111   Samples given per MNT protocol. Patient educated on appropriate usage: Celebrate Multivitamin chew (berry - qty 1) Lot #: [id number] Exp: 06/2016  PB2 (chocolate - qty 1) Lot #: none Exp: 02/2015  Celebrate Vitamins Calcium citrate chew (chocolate - qty 1) Lot #: [id number] Exp: 06/2016  Premier protein shake (vanilla - qty 1) Lot #: [id number] Exp: 04/2015  Renee Pain Protein Powder (unflavored - qty 1) Lot #: [id number] Exp: 10/2015   The following the learning objectives were met by the patient during this course:  Identify Pre-Op Dietary Goals and will begin 2 weeks pre-operatively  Identify appropriate sources of fluids and proteins   State protein recommendations and appropriate sources pre and post-operatively  Identify Post-Operative Dietary Goals and will follow for 2 weeks post-operatively  Identify appropriate multivitamin and calcium sources  Describe the need for physical activity post-operatively and will follow MD recommendations  State when to call healthcare provider regarding medication questions or post-operative complications  Handouts given during class include:  Pre-Op Bariatric Surgery Diet Handout  Protein Shake Handout  Post-Op Bariatric Surgery Nutrition Handout  BELT Program Information Flyer  Support Group Information Flyer  [facility] Bariatric Supplements Price List  Follow-Up Plan: Patient will follow-up at [facility] 2 weeks post operatively for diet advancement per MD.

## 2014-09-18 ENCOUNTER — Ambulatory Visit: Payer: Self-pay | Admitting: General Surgery

## 2014-09-18 NOTE — H&P (Signed)
Emily Leon 09/18/2014 11:33 AM Location: [facility] Patient #: [id number] DOB: 22-Oct-1950 Married / Language: English / Race: Black or African American Female History of Present Illness [name] M. [name] MD; 09/18/2014 1:42 PM) Patient words: bariatric.  The patient is a 64 year old female who presents for a pre-op visit. She comes in today for her preoperative visit. She has been approved for laparoscopic sleeve gastrectomy on June 13. Initially met her November 14, 2013. Her weight at that time was 308 pounds. She denies any medical changes or trips to the emergency room or hospital since I initially met her last summer. She states that she did have a change to her diabetes medication by her endocrinologist. She denies any chest pain or chest pressure. She denies any shortness of breath. She does not get short of breath while walking. She may get some fatigue. She denies any amaurosis fugax or TIAs. She denies any reflux.  Her upper GI and chest x-ray were within normal limits. Her ultrasound of her abdomen showed a single gallstone and evidence of fatty liver. Her blood work that was done both in October and in January 2016 showed a hemoglobin A1c of 10.6. Vitamin D level of 19.9. Normal CBC. Lipid panel was within normal limits. Her kidney function was normal. Her BUN was 17 her creatinine was 1. Her H. pylori antibody was positive. She has completed her H. pylori treatment which was completed last Friday. 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 PYLORI TEST (041.86  A04.8)  Other Problems [name], MD; 09/18/2014 1:41 PM) Arthritis Back Pain Diabetes Mellitus High blood  pressure Hypercholesterolemia Hypertension Fatigue Chronic kidney disease  Past Surgical History [name], CMA; 09/18/2014 11:33 AM) Knee Surgery Left.  Diagnostic Studies History [name], CMA; 09/18/2014 11:33 AM) Colonoscopy 5-10 years ago Mammogram within last year Pap Smear 1-5 years ago  Allergies [name], CMA; 09/18/2014 11:34 AM) Penicillin G Benzathine *PENICILLINS* Sulfacetamide *CHEMICALS*  Medication History [name], MD; 09/18/2014 1:41 PM) Atenolol (100MG Tablet, Oral) Active. AmLODIPine Besylate (10MG Tablet, Oral) Active. HumuLIN R U-500 (CONCENTRATED) (500UNIT/ML Solution, Subcutaneous) Active. Vytorin (10-40MG Tablet, Oral) Active. Hydrochlorothiazide (25MG Tablet, Oral) Active. Valsartan (160MG Tablet, Oral) Active. Medications Reconciled OxyCODONE HCl (5MG/5ML Solution, 5-10 Milliliter Oral every four hours, as needed, Taken starting 09/18/2014) Active. PriLOSEC (20MG Capsule DR, 1 (one) Capsule DR Oral two times daily, Taken starting 08/26/2014) Active. Diflucan (150MG Tablet, one Tablet Oral one time dose, Taken starting 09/04/2014) Active.  Social History ([place]; 09/18/2014 11:33 AM) Caffeine use Coffee. No alcohol use Tobacco use Never smoker.  Family History [name], [place]; 09/18/2014 11:33 AM) Arthritis Father. Diabetes Mellitus Father. Heart Disease Father, Mother. Heart disease in female family member before age 44 Hypertension Father, Mother.  Pregnancy / Birth History [name], [place]; 09/18/2014 11:33 AM) Age at menarche 58 years. Gravida 1 Maternal age 50-20 Para 1     Review of Systems ([place]; 09/18/2014 11:33 AM) General Present- Night Sweats. Not Present- Appetite Loss, Chills, Fatigue, Fever, Weight Gain and Weight Loss. HEENT Present- Wears glasses/contact lenses. Not Present- Earache, Hearing Loss, Hoarseness, Nose Bleed, Oral Ulcers, Ringing in the Ears, Seasonal  Allergies, Sinus Pain, Sore Throat, Visual Disturbances  and Yellow Eyes. Respiratory Present- Snoring. Not Present- Bloody sputum, Chronic Cough, Difficulty Breathing and Wheezing. Breast Not Present- Breast Mass, Breast Pain, Nipple Discharge and Skin Changes. Cardiovascular Present- Shortness of Breath and Swelling of Extremities. Not Present- Chest Pain, Difficulty Breathing Lying Down, Leg Cramps, Palpitations and Rapid Heart Rate. Gastrointestinal Not Present- Abdominal Pain, Bloating, Bloody Stool, Change in Bowel Habits, Chronic diarrhea, Constipation, Difficulty Swallowing, Excessive gas, Gets full quickly at meals, Hemorrhoids, Indigestion, Nausea, Rectal Pain and Vomiting. Female Genitourinary Present- Nocturia. Not Present- Frequency, Painful Urination, Pelvic Pain and Urgency. Musculoskeletal Present- Back Pain. Not Present- Joint Pain, Joint Stiffness, Muscle Pain, Muscle Weakness and Swelling of Extremities. Neurological Present- Trouble walking. Not Present- Decreased Memory, Fainting, Headaches, Numbness, Seizures, Tingling, Tremor and Weakness. Psychiatric Not Present- Anxiety, Bipolar, Change in Sleep Pattern, Depression, Fearful and Frequent crying. Hematology Not Present- Easy Bruising, Excessive bleeding, Gland problems, HIV and Persistent Infections.  Vitals ([name] CMA; 09/18/2014 11:34 AM) 09/18/2014 11:33 AM Weight: 313.6 lb Height: 69in Body Surface Area: 2.63 m Body Mass Index: 46.31 kg/m Temp.: 97.12F(Temporal)  Pulse: 76 (Regular)  BP: 138/82 (Sitting, Left Arm, Standard)     Physical Exam [name] M. Keifer Habib MD; 09/18/2014 1:37 PM)  General Mental Status-Alert. General Appearance-Consistent with stated age. Hydration-Well hydrated. Voice-Normal. Note: Morbidly obese   Head and Neck Head-normocephalic, atraumatic with no lesions or palpable masses. Trachea-midline. Thyroid Gland Characteristics - normal size and  consistency.  Eye Eyeball - Bilateral-Extraocular movements intact. Sclera/Conjunctiva - Bilateral-No scleral icterus.  Chest and Lung Exam Chest and lung exam reveals -quiet, even and easy respiratory effort with no use of accessory muscles and on auscultation, normal breath sounds, no adventitious sounds and normal vocal resonance. Inspection Chest Wall - Normal. Back - normal.  Breast - Did not examine.  Cardiovascular Cardiovascular examination reveals -normal heart sounds, regular rate and rhythm with no murmurs and normal pedal pulses bilaterally.  Abdomen Inspection Inspection of the abdomen reveals - No Hernias. Skin - Scar - no surgical scars. Palpation/Percussion Palpation and Percussion of the abdomen reveal - Soft, Non Tender, No Rebound tenderness, No Rigidity (guarding) and No hepatosplenomegaly. Auscultation Auscultation of the abdomen reveals - Bowel sounds normal.  Peripheral Vascular Upper Extremity Palpation - Pulses bilaterally normal.  Neurologic Neurologic evaluation reveals -alert and oriented x 3 with no impairment of recent or remote memory. Mental Status-Normal.  Neuropsychiatric The patient's mood and affect are described as -normal. Judgment and Insight-insight is appropriate concerning matters relevant to self.  Musculoskeletal Normal Exam - Left-Upper Extremity Strength Normal and Lower Extremity Strength Normal. Normal Exam - Right-Upper Extremity Strength Normal and Lower Extremity Strength Normal.  Lymphatic Head & Neck  General Head & Neck Lymphatics: Bilateral - Description - Normal. Axillary - Did not examine. Femoral & Inguinal - Did not examine.    Assessment & Plan [name] M. Rowland Ericsson MD; 09/18/2014 1:42 PM)  MORBID OBESITY WITH BMI OF 45.0-49.9, ADULT (278.01  E66.01) Impression: We discussed the results of her workup. It appears that she is ready for surgery. We discussed the importance of the preoperative  diet. All of her questions were addressed and answered. We rediscussed the typical postoperative pathway. She was given her postoperative pain medicine prescription today. I encouraged her to contact the office should she have any questions between now and surgery. She does have a diagnosis of chronic kidney disease in her electronic chart in Epic however I can't find any instance of kidney failure. Moreover she has a normal BUN  and creatinine.  Current Plans Pt Education - EMW_preopbariatric Started OxyCODONE HCl 5MG/5ML, 5-10 Milliliter every four hours, as needed, 200 Milliliter, 09/18/2014, No Refill. POSITIVE H. PYLORI TEST (041.86  A04.8) Impression: She has completed treatment. We will send the extracted stomach to pathology and have been tested for H. 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 We discussed that she will need to be in contact with her endocrinologist more frequently after surgery because of the change in her diabetes medication  HYPERTENSION, BENIGN (401.1  I10)  OSA ON CPAP (327.23  G47.33)  FATTY LIVER (571.8  K76.0)  Leighton Ruff. Redmond Pulling, MD, FACS General, Bariatric, & Minimally Invasive Surgery [facility], [place]

## 2014-09-25 ENCOUNTER — Ambulatory Visit (HOSPITAL_COMMUNITY)
Admission: RE | Admit: 2014-09-25 | Discharge: 2014-09-25 | Disposition: A | Discharge status: Home / Self Care | Payer: Medicare Other | Source: Ambulatory Visit | Attending: Anesthesiology | Admitting: Anesthesiology

## 2014-09-25 ENCOUNTER — Encounter (HOSPITAL_COMMUNITY): Payer: Self-pay

## 2014-09-25 ENCOUNTER — Encounter (HOSPITAL_COMMUNITY)
Admission: RE | Admit: 2014-09-25 | Discharge: 2014-09-25 | Disposition: A | Discharge status: Home / Self Care | Payer: Medicare Other | Source: Ambulatory Visit | Attending: General Surgery | Admitting: General Surgery

## 2014-09-25 DIAGNOSIS — Z01818 Encounter for other preprocedural examination: Secondary | ICD-10-CM | POA: Insufficient documentation

## 2014-09-25 DIAGNOSIS — R9389 Abnormal findings on diagnostic imaging of other specified body structures: Secondary | ICD-10-CM

## 2014-09-25 DIAGNOSIS — E669 Obesity, unspecified: Secondary | ICD-10-CM | POA: Diagnosis not present

## 2014-09-25 HISTORY — DX: Reserved for inherently not codable concepts without codable children: IMO0001

## 2014-09-25 HISTORY — DX: Sleep apnea, unspecified: G47.30

## 2014-09-25 HISTORY — DX: Unspecified cataract: H26.9

## 2014-09-25 HISTORY — DX: Cough: R05

## 2014-09-25 HISTORY — DX: Cough, unspecified: R05.9

## 2014-09-25 HISTORY — DX: Bronchitis, not specified as acute or chronic: J40

## 2014-09-25 HISTORY — DX: Frequency of micturition: R35.0

## 2014-09-25 LAB — CBC WITH DIFFERENTIAL/PLATELET
Basophils Absolute: 0 10*3/uL (ref 0.0–0.1)
Basophils Relative: 0 % (ref 0–1)
EOS ABS: 0.2 10*3/uL (ref 0.0–0.7)
Eosinophils Relative: 2 % (ref 0–5)
HCT: 39.9 % (ref 36.0–46.0)
Hemoglobin: 12.9 g/dL (ref 12.0–15.0)
LYMPHS ABS: 2.5 10*3/uL (ref 0.7–4.0)
Lymphocytes Relative: 30 % (ref 12–46)
MCH: 28.8 pg (ref 26.0–34.0)
MCHC: 32.3 g/dL (ref 30.0–36.0)
MCV: 89.1 fL (ref 78.0–100.0)
MONO ABS: 0.8 10*3/uL (ref 0.1–1.0)
Monocytes Relative: 9 % (ref 3–12)
Neutro Abs: 5 10*3/uL (ref 1.7–7.7)
Neutrophils Relative %: 59 % (ref 43–77)
Platelets: 282 10*3/uL (ref 150–400)
RBC: 4.48 MIL/uL (ref 3.87–5.11)
RDW: 12.9 % (ref 11.5–15.5)
WBC: 8.5 10*3/uL (ref 4.0–10.5)

## 2014-09-25 LAB — COMPREHENSIVE METABOLIC PANEL
ALK PHOS: 64 U/L (ref 38–126)
ALT: 26 U/L (ref 14–54)
ANION GAP: 10 (ref 5–15)
AST: 24 U/L (ref 15–41)
Albumin: 3.6 g/dL (ref 3.5–5.0)
BILIRUBIN TOTAL: 0.4 mg/dL (ref 0.3–1.2)
BUN: 32 mg/dL — AB (ref 6–20)
CHLORIDE: 98 mmol/L — AB (ref 101–111)
CO2: 27 mmol/L (ref 22–32)
CREATININE: 1.15 mg/dL — AB (ref 0.44–1.00)
Calcium: 10.1 mg/dL (ref 8.9–10.3)
GFR calc Af Amer: 58 mL/min — ABNORMAL LOW (ref >60)
GFR, EST NON AFRICAN AMERICAN: 50 mL/min — AB (ref >60)
Glucose, Bld: 199 mg/dL — ABNORMAL HIGH (ref 65–99)
POTASSIUM: 3.9 mmol/L (ref 3.5–5.1)
SODIUM: 135 mmol/L (ref 135–145)
Total Protein: 7.2 g/dL (ref 6.5–8.1)

## 2014-09-25 NOTE — Progress Notes (Signed)
Spoke with Dr Acey Lav / anesthesia in regards to pt H&P and EKG. Anesthesia to see pt day of surgery.

## 2014-09-25 NOTE — Progress Notes (Signed)
Instructed pt to follow up with surgeon's office in regards to any specific bowel instructions prior to surgery. Pt was unaware of any specific instructions.  OV note per chart per Dr Purcell Nails  Pulmonary sleep test per chart 09/20/2012 OV note per chart per Vania Rea ANP 12/26/2013

## 2014-09-25 NOTE — Patient Instructions (Signed)
Emily Leon  09/25/2014   Your procedure is scheduled on: Monday September 29, 2014   Report to [facility] Main  Entrance and follow signs to               Short Stay Center arrive at 9:15 AM.  Call this number if you have problems the morning of surgery [phone]   Remember: ONLY 1 PERSON MAY GO WITH YOU TO SHORT STAY TO GET  READY MORNING OF YOUR SURGERY.  Do not eat food or drink liquids :After Midnight.     Take these medicines the morning of surgery with A SIP OF WATER: Amlodipine, Atenolol (Tenormin);  1/2 dose of insulin night prior to surgery                                You may not have any metal on your body including hair pins and              piercings  Do not wear jewelry, make-up, lotions, powders or perfumes, deodorant             Do not wear nail polish.  Do not shave  48 hours prior to surgery.              Do not bring valuables to the hospital. [facility] IS NOT             RESPONSIBLE   FOR VALUABLES.  Contacts, dentures or bridgework may not be worn into surgery.  Leave suitcase in the car. After surgery it may be brought to your room.                [facility] - Preparing for Surgery Before surgery, you can play an important role.  Because skin is not sterile, your skin needs to be as free of germs as possible.  You can reduce the number of germs on your skin by washing with CHG (chlorahexidine gluconate) soap before surgery.  CHG is an antiseptic cleaner which kills germs and bonds with the skin to continue killing germs even after washing. Please DO NOT use if you have an allergy to CHG or antibacterial soaps.  If your skin becomes reddened/irritated stop using the CHG and inform your nurse when you arrive at Short Stay. Do not shave (including legs and underarms) for at least 48 hours prior to the first CHG shower.  You may shave your face/neck. Please follow these instructions carefully:  1.  Shower with CHG Soap the night  before surgery and the  morning of Surgery.  2.  If you choose to wash your hair, wash your hair first as usual with your  normal  shampoo.  3.  After you shampoo, rinse your hair and body thoroughly to remove the  shampoo.                           4.  Use CHG as you would any other liquid soap.  You can apply chg directly  to the skin and wash                       Gently with a scrungie or clean washcloth.  5.  Apply the CHG Soap to your body ONLY FROM THE NECK DOWN.  Do not use on face/ open                           Wound or open sores. Avoid contact with eyes, ears mouth and genitals (private parts).                       Wash face,  Genitals (private parts) with your normal soap.             6.  Wash thoroughly, paying special attention to the area where your surgery  will be performed.  7.  Thoroughly rinse your body with warm water from the neck down.  8.  DO NOT shower/wash with your normal soap after using and rinsing off  the CHG Soap.                9.  Pat yourself dry with a clean towel.            10.  Wear clean pajamas.            11.  Place clean sheets on your bed the night of your first shower and do not  sleep with pets. Day of Surgery : Do not apply any lotions/deodorants the morning of surgery.  Please wear clean clothes to the hospital/surgery center.  FAILURE TO FOLLOW THESE INSTRUCTIONS MAY RESULT IN THE CANCELLATION OF YOUR SURGERY PATIENT SIGNATURE_________________________________  NURSE SIGNATURE__________________________________  ________________________________________________________________________

## 2014-09-26 NOTE — Progress Notes (Signed)
CMP results per epic per PAT visit 09/25/2014 sent to Dr [name]

## 2014-09-29 ENCOUNTER — Encounter (HOSPITAL_COMMUNITY): Admission: RE | Payer: Self-pay | Source: Ambulatory Visit

## 2014-09-29 ENCOUNTER — Inpatient Hospital Stay (HOSPITAL_COMMUNITY): Admission: RE | Admit: 2014-09-29 | Payer: Medicare Other | Source: Ambulatory Visit | Admitting: General Surgery

## 2014-09-29 SURGERY — LAPAROSCOPIC ROUX-EN-Y GASTRIC BYPASS WITH UPPER ENDOSCOPY
Anesthesia: General

## 2014-10-14 ENCOUNTER — Ambulatory Visit: Payer: Medicare Other

## 2014-11-06 ENCOUNTER — Encounter (HOSPITAL_COMMUNITY)
Admission: RE | Admit: 2014-11-06 | Discharge: 2014-11-06 | Disposition: A | Discharge status: Home / Self Care | Payer: Medicare Other | Source: Ambulatory Visit | Attending: General Surgery | Admitting: General Surgery

## 2014-11-06 ENCOUNTER — Encounter (HOSPITAL_COMMUNITY): Payer: Self-pay

## 2014-11-06 DIAGNOSIS — I10 Essential (primary) hypertension: Secondary | ICD-10-CM | POA: Diagnosis not present

## 2014-11-06 DIAGNOSIS — Z6841 Body Mass Index (BMI) 40.0 and over, adult: Secondary | ICD-10-CM | POA: Diagnosis not present

## 2014-11-06 DIAGNOSIS — G4733 Obstructive sleep apnea (adult) (pediatric): Secondary | ICD-10-CM | POA: Diagnosis not present

## 2014-11-06 DIAGNOSIS — E119 Type 2 diabetes mellitus without complications: Secondary | ICD-10-CM | POA: Diagnosis not present

## 2014-11-06 DIAGNOSIS — Z01812 Encounter for preprocedural laboratory examination: Secondary | ICD-10-CM | POA: Diagnosis not present

## 2014-11-06 DIAGNOSIS — K760 Fatty (change of) liver, not elsewhere classified: Secondary | ICD-10-CM | POA: Insufficient documentation

## 2014-11-06 HISTORY — DX: Paresthesia of skin: R20.2

## 2014-11-06 LAB — BASIC METABOLIC PANEL
Anion gap: 7 (ref 5–15)
BUN: 32 mg/dL — AB (ref 6–20)
CO2: 27 mmol/L (ref 22–32)
CREATININE: 1.09 mg/dL — AB (ref 0.44–1.00)
Calcium: 10.1 mg/dL (ref 8.9–10.3)
Chloride: 103 mmol/L (ref 101–111)
GFR calc Af Amer: >60 mL/min (ref >60)
GFR calc non Af Amer: 53 mL/min — ABNORMAL LOW (ref >60)
Glucose, Bld: 224 mg/dL — ABNORMAL HIGH (ref 65–99)
Potassium: 4.3 mmol/L (ref 3.5–5.1)
Sodium: 137 mmol/L (ref 135–145)

## 2014-11-06 LAB — CBC
HCT: 41.4 % (ref 36.0–46.0)
HEMOGLOBIN: 13.4 g/dL (ref 12.0–15.0)
MCH: 29.3 pg (ref 26.0–34.0)
MCHC: 32.4 g/dL (ref 30.0–36.0)
MCV: 90.4 fL (ref 78.0–100.0)
Platelets: 328 10*3/uL (ref 150–400)
RBC: 4.58 MIL/uL (ref 3.87–5.11)
RDW: 13.1 % (ref 11.5–15.5)
WBC: 8.5 10*3/uL (ref 4.0–10.5)

## 2014-11-06 NOTE — Progress Notes (Addendum)
CXR epic 09/25/2014 EKG epic 09/25/2014  OV note per chart per Dr Fransico Him / endocrinology 09/26/2014

## 2014-11-06 NOTE — Progress Notes (Signed)
BMP results in epic per PAT visit 11/06/2014 sent to Dr [name]. Anesthesia aware of glucose level. Anesthesia to see pt day of surgery.

## 2014-11-06 NOTE — Progress Notes (Signed)
Order for surgical consent needs to be placed in epic. Thanks.

## 2014-11-06 NOTE — Patient Instructions (Signed)
QUYNH BASSO  11/06/2014   Your procedure is scheduled on: Tuesday November 11, 2014   Report to [facility] Main  Entrance take [place]  elevators to 3rd floor to  Short Stay Center at 9:15 AM.  Call this number if you have problems the morning of surgery [phone]   Remember: ONLY 1 PERSON MAY GO WITH YOU TO SHORT STAY TO GET  READY MORNING OF YOUR SURGERY.  Do not eat food or drink liquids :After Midnight.     Take these medicines the morning of surgery with A SIP OF WATER: Amlodipine (Norvasc); Atenolol (Tenormin); Loratadine (Claritin);  TAKE 1/2 OF USUAL DOSE OF INSULIN NIGHT PRIOR TO SURGERY.                                You may not have any metal on your body including hair pins and              piercings  Do not wear jewelry, make-up, lotions, powders or perfumes, deodorant             Do not wear nail polish.  Do not shave  48 hours prior to surgery.                 Do not bring valuables to the hospital. [facility] IS NOT             RESPONSIBLE   FOR VALUABLES.  Contacts, dentures or bridgework may not be worn into surgery.  Leave suitcase in the car. After surgery it may be brought to your room.     _____________________________________________________________________             [facility] - Preparing for Surgery Before surgery, you can play an important role.  Because skin is not sterile, your skin needs to be as free of germs as possible.  You can reduce the number of germs on your skin by washing with CHG (chlorahexidine gluconate) soap before surgery.  CHG is an antiseptic cleaner which kills germs and bonds with the skin to continue killing germs even after washing. Please DO NOT use if you have an allergy to CHG or antibacterial soaps.  If your skin becomes reddened/irritated stop using the CHG and inform your nurse when you arrive at Short Stay. Do not shave (including legs and underarms) for at least 48 hours prior to the first CHG  shower.  You may shave your face/neck. Please follow these instructions carefully:  1.  Shower with CHG Soap the night before surgery and the  morning of Surgery.  2.  If you choose to wash your hair, wash your hair first as usual with your  normal  shampoo.  3.  After you shampoo, rinse your hair and body thoroughly to remove the  shampoo.                           4.  Use CHG as you would any other liquid soap.  You can apply chg directly  to the skin and wash                       Gently with a scrungie or clean washcloth.  5.  Apply the CHG Soap to your body ONLY FROM THE NECK DOWN.  Do not use on face/ open                           Wound or open sores. Avoid contact with eyes, ears mouth and genitals (private parts).                       Wash face,  Genitals (private parts) with your normal soap.             6.  Wash thoroughly, paying special attention to the area where your surgery  will be performed.  7.  Thoroughly rinse your body with warm water from the neck down.  8.  DO NOT shower/wash with your normal soap after using and rinsing off  the CHG Soap.                9.  Pat yourself dry with a clean towel.            10.  Wear clean pajamas.            11.  Place clean sheets on your bed the night of your first shower and do not  sleep with pets. Day of Surgery : Do not apply any lotions/deodorants the morning of surgery.  Please wear clean clothes to the hospital/surgery center.  FAILURE TO FOLLOW THESE INSTRUCTIONS MAY RESULT IN THE CANCELLATION OF YOUR SURGERY PATIENT SIGNATURE_________________________________  NURSE SIGNATURE__________________________________  ________________________________________________________________________

## 2014-11-07 ENCOUNTER — Ambulatory Visit: Payer: Self-pay | Admitting: General Surgery

## 2014-11-07 NOTE — H&P (Signed)
Emily Leon 09/18/2014 11:33 AM Location: [facility] Patient #: [id number] DOB: 08/26/1950 Married / Language: English / Race: Black or African American Female History of Present Illness ([name] MD; 09/18/2014 1:42 PM) Patient words: bariatric.  The patient is a 64 year old female who presents for a pre-op visit. She comes in today for her preoperative visit. She has been approved for laparoscopic sleeve gastrectomy on June 13. Initially met her November 14, 2013. Her weight at that time was 308 pounds. She denies any medical changes or trips to the emergency room or hospital since I initially met her last summer. She states that she did have a change to her diabetes medication by her endocrinologist. She denies any chest pain or chest pressure. She denies any shortness of breath. She does not get short of breath while walking. She may get some fatigue. She denies any amaurosis fugax or TIAs. She denies any reflux.  Her upper GI and chest x-ray were within normal limits. Her ultrasound of her abdomen showed a single gallstone and evidence of fatty liver. Her blood work that was done both in October and in January 2016 showed a hemoglobin A1c of 10.6. Vitamin D level of 19.9. Normal CBC. Lipid panel was within normal limits. Her kidney function was normal. Her BUN was 17 her creatinine was 1. Her H. pylori antibody was positive. She has completed her H. pylori treatment which was completed last Friday. 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 PYLORI TEST (041.86  A04.8)  Other Problems ([name], MD; 09/18/2014 1:41 PM) Arthritis Back Pain Diabetes Mellitus High blood  pressure Hypercholesterolemia Hypertension Fatigue Chronic kidney disease  Past Surgical History ([name], CMA; 09/18/2014 11:33 AM) Knee Surgery Left.  Diagnostic Studies History ([name], CMA; 09/18/2014 11:33 AM) Colonoscopy 5-10 years ago Mammogram within last year Pap Smear 1-5 years ago  Allergies ([name], CMA; 09/18/2014 11:34 AM) Penicillin G Benzathine *PENICILLINS* Sulfacetamide *CHEMICALS*  Medication History ([name], MD; 09/18/2014 1:41 PM) Atenolol (100MG Tablet, Oral) Active. AmLODIPine Besylate (10MG Tablet, Oral) Active. HumuLIN R U-500 (CONCENTRATED) (500UNIT/ML Solution, Subcutaneous) Active. Vytorin (10-40MG Tablet, Oral) Active. Hydrochlorothiazide (25MG Tablet, Oral) Active. Valsartan (160MG Tablet, Oral) Active. Medications Reconciled OxyCODONE HCl (5MG/5ML Solution, 5-10 Milliliter Oral every four hours, as needed, Taken starting 09/18/2014) Active. PriLOSEC (20MG Capsule DR, 1 (one) Capsule DR Oral two times daily, Taken starting 08/26/2014) Active. Diflucan (150MG Tablet, one Tablet Oral one time dose, Taken starting 09/04/2014) Active.  Social History ([name], CMA; 09/18/2014 11:33 AM) Caffeine use Coffee. No alcohol use Tobacco use Never smoker.  Family History ([name], CMA; 09/18/2014 11:33 AM) Arthritis Father. Diabetes Mellitus Father. Heart Disease Father, Mother. Heart disease in female family member before age 65 Hypertension Father, Mother.  Pregnancy / Birth History ([name], CMA; 09/18/2014 11:33 AM) Age at menarche 17 years. Gravida 1 Maternal age 15-20 Para 1     Review of Systems ([name] CMA; 09/18/2014 11:33 AM) General Present- Night Sweats. Not Present- Appetite Loss, Chills, Fatigue, Fever, Weight Gain and Weight Loss. HEENT Present- Wears glasses/contact lenses. Not Present- Earache, Hearing Loss, Hoarseness, Nose Bleed, Oral Ulcers, Ringing in the Ears, Seasonal  Allergies, Sinus Pain, Sore Throat, Visual Disturbances   and Yellow Eyes. Respiratory Present- Snoring. Not Present- Bloody sputum, Chronic Cough, Difficulty Breathing and Wheezing. Breast Not Present- Breast Mass, Breast Pain, Nipple Discharge and Skin Changes. Cardiovascular Present- Shortness of Breath and Swelling of Extremities. Not Present- Chest Pain, Difficulty Breathing Lying Down, Leg Cramps, Palpitations and Rapid Heart Rate. Gastrointestinal Not Present- Abdominal Pain, Bloating, Bloody Stool, Change in Bowel Habits, Chronic diarrhea, Constipation, Difficulty Swallowing, Excessive gas, Gets full quickly at meals, Hemorrhoids, Indigestion, Nausea, Rectal Pain and Vomiting. Female Genitourinary Present- Nocturia. Not Present- Frequency, Painful Urination, Pelvic Pain and Urgency. Musculoskeletal Present- Back Pain. Not Present- Joint Pain, Joint Stiffness, Muscle Pain, Muscle Weakness and Swelling of Extremities. Neurological Present- Trouble walking. Not Present- Decreased Memory, Fainting, Headaches, Numbness, Seizures, Tingling, Tremor and Weakness. Psychiatric Not Present- Anxiety, Bipolar, Change in Sleep Pattern, Depression, Fearful and Frequent crying. Hematology Not Present- Easy Bruising, Excessive bleeding, Gland problems, HIV and Persistent Infections.  Vitals ([name] CMA; 09/18/2014 11:34 AM) 09/18/2014 11:33 AM Weight: 313.6 lb Height: 69in Body Surface Area: 2.63 m Body Mass Index: 46.31 kg/m Temp.: 97.6F(Temporal)  Pulse: 76 (Regular)  BP: 138/82 (Sitting, Left Arm, Standard)     Physical Exam ([name] MD; 09/18/2014 1:37 PM)  General Mental Status-Alert. General Appearance-Consistent with stated age. Hydration-Well hydrated. Voice-Normal. Note: Morbidly obese   Head and Neck Head-normocephalic, atraumatic with no lesions or palpable masses. Trachea-midline. Thyroid Gland Characteristics - normal size and  consistency.  Eye Eyeball - Bilateral-Extraocular movements intact. Sclera/Conjunctiva - Bilateral-No scleral icterus.  Chest and Lung Exam Chest and lung exam reveals -quiet, even and easy respiratory effort with no use of accessory muscles and on auscultation, normal breath sounds, no adventitious sounds and normal vocal resonance. Inspection Chest Wall - Normal. Back - normal.  Breast - Did not examine.  Cardiovascular Cardiovascular examination reveals -normal heart sounds, regular rate and rhythm with no murmurs and normal pedal pulses bilaterally.  Abdomen Inspection Inspection of the abdomen reveals - No Hernias. Skin - Scar - no surgical scars. Palpation/Percussion Palpation and Percussion of the abdomen reveal - Soft, Non Tender, No Rebound tenderness, No Rigidity (guarding) and No hepatosplenomegaly. Auscultation Auscultation of the abdomen reveals - Bowel sounds normal.  Peripheral Vascular Upper Extremity Palpation - Pulses bilaterally normal.  Neurologic Neurologic evaluation reveals -alert and oriented x 3 with no impairment of recent or remote memory. Mental Status-Normal.  Neuropsychiatric The patient's mood and affect are described as -normal. Judgment and Insight-insight is appropriate concerning matters relevant to self.  Musculoskeletal Normal Exam - Left-Upper Extremity Strength Normal and Lower Extremity Strength Normal. Normal Exam - Right-Upper Extremity Strength Normal and Lower Extremity Strength Normal.  Lymphatic Head & Neck  General Head & Neck Lymphatics: Bilateral - Description - Normal. Axillary - Did not examine. Femoral & Inguinal - Did not examine.    Assessment & Plan (Yale Golla M. Juddson Cobern MD; 09/18/2014 1:42 PM)  MORBID OBESITY WITH BMI OF 45.0-49.9, ADULT (278.01  E66.01) Impression: We discussed the results of her workup. It appears that she is ready for surgery. We discussed the importance of the preoperative  diet. All of her questions were addressed and answered. We rediscussed the typical postoperative pathway. She was given her postoperative pain medicine prescription today. I encouraged her to contact the office should she have any questions between now and surgery. She does have a diagnosis of chronic kidney disease in her electronic chart in Epic however I can't find any instance of kidney failure. Moreover she has a normal BUN   Current Plans  Pt Education - EMW_preopbariatric Started OxyCODONE HCl 5MG/5ML, 5-10 Milliliter every four hours, as needed, 200 Milliliter, 09/18/2014, No Refill. POSITIVE H. PYLORI TEST (041.86  A04.8) Impression: She has completed treatment. We will send the extracted stomach to pathology and have been tested for H. 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 We discussed that she will need to be in contact with her endocrinologist more frequently after surgery because of the change in her diabetes medication HYPERTENSION, BENIGN (401.1  I10) OSA ON CPAP (327.23  G47.33) FATTY LIVER (571.8  K76.0)  Leighton Ruff. Redmond Pulling, MD, FACS General, Bariatric, & Minimally Invasive Surgery [facility], [place]

## 2014-11-10 MED ORDER — LEVOFLOXACIN IN D5W 750 MG/150ML IV SOLN
750.0000 mg | INTRAVENOUS | Status: AC
  Administered 2014-11-11: 750 mg via INTRAVENOUS
  Filled 2014-11-10: qty 150

## 2014-11-11 ENCOUNTER — Encounter (HOSPITAL_COMMUNITY)
Admission: RE | Disposition: A | Discharge status: Home / Self Care | Payer: Self-pay | Source: Ambulatory Visit | Attending: General Surgery

## 2014-11-11 ENCOUNTER — Inpatient Hospital Stay (HOSPITAL_COMMUNITY): Payer: Medicare Other | Admitting: Certified Registered Nurse Anesthetist

## 2014-11-11 ENCOUNTER — Encounter (HOSPITAL_COMMUNITY): Payer: Self-pay | Admitting: *Deleted

## 2014-11-11 ENCOUNTER — Inpatient Hospital Stay (HOSPITAL_COMMUNITY)
Admission: RE | Admit: 2014-11-11 | Discharge: 2014-11-13 | DRG: 621 | Disposition: A | Discharge status: Home / Self Care | Payer: Medicare Other | Source: Ambulatory Visit | Attending: General Surgery | Admitting: General Surgery

## 2014-11-11 DIAGNOSIS — Z880 Allergy status to penicillin: Secondary | ICD-10-CM | POA: Diagnosis not present

## 2014-11-11 DIAGNOSIS — E1122 Type 2 diabetes mellitus with diabetic chronic kidney disease: Secondary | ICD-10-CM | POA: Diagnosis present

## 2014-11-11 DIAGNOSIS — K449 Diaphragmatic hernia without obstruction or gangrene: Secondary | ICD-10-CM | POA: Diagnosis present

## 2014-11-11 DIAGNOSIS — Z882 Allergy status to sulfonamides status: Secondary | ICD-10-CM

## 2014-11-11 DIAGNOSIS — Z6841 Body Mass Index (BMI) 40.0 and over, adult: Secondary | ICD-10-CM | POA: Diagnosis not present

## 2014-11-11 DIAGNOSIS — G4733 Obstructive sleep apnea (adult) (pediatric): Secondary | ICD-10-CM | POA: Diagnosis present

## 2014-11-11 DIAGNOSIS — IMO0001 Reserved for inherently not codable concepts without codable children: Secondary | ICD-10-CM

## 2014-11-11 DIAGNOSIS — E119 Type 2 diabetes mellitus without complications: Secondary | ICD-10-CM

## 2014-11-11 DIAGNOSIS — E785 Hyperlipidemia, unspecified: Secondary | ICD-10-CM | POA: Diagnosis present

## 2014-11-11 DIAGNOSIS — Z9884 Bariatric surgery status: Secondary | ICD-10-CM

## 2014-11-11 DIAGNOSIS — Z9989 Dependence on other enabling machines and devices: Secondary | ICD-10-CM

## 2014-11-11 DIAGNOSIS — N189 Chronic kidney disease, unspecified: Secondary | ICD-10-CM | POA: Diagnosis present

## 2014-11-11 DIAGNOSIS — I129 Hypertensive chronic kidney disease with stage 1 through stage 4 chronic kidney disease, or unspecified chronic kidney disease: Secondary | ICD-10-CM | POA: Diagnosis present

## 2014-11-11 DIAGNOSIS — E780 Pure hypercholesterolemia: Secondary | ICD-10-CM | POA: Diagnosis present

## 2014-11-11 DIAGNOSIS — R110 Nausea: Secondary | ICD-10-CM

## 2014-11-11 DIAGNOSIS — Z794 Long term (current) use of insulin: Secondary | ICD-10-CM | POA: Diagnosis not present

## 2014-11-11 DIAGNOSIS — K760 Fatty (change of) liver, not elsewhere classified: Secondary | ICD-10-CM | POA: Diagnosis present

## 2014-11-11 DIAGNOSIS — E1165 Type 2 diabetes mellitus with hyperglycemia: Secondary | ICD-10-CM | POA: Diagnosis present

## 2014-11-11 DIAGNOSIS — I10 Essential (primary) hypertension: Secondary | ICD-10-CM | POA: Diagnosis present

## 2014-11-11 DIAGNOSIS — M1990 Unspecified osteoarthritis, unspecified site: Secondary | ICD-10-CM | POA: Diagnosis present

## 2014-11-11 HISTORY — PX: LAPAROSCOPIC GASTRIC SLEEVE RESECTION: SHX5895

## 2014-11-11 HISTORY — PX: UPPER GI ENDOSCOPY: SHX6162

## 2014-11-11 HISTORY — DX: Bariatric surgery status: Z98.84

## 2014-11-11 LAB — GLUCOSE, CAPILLARY
Glucose-Capillary: 266 mg/dL — ABNORMAL HIGH (ref 65–99)
Glucose-Capillary: 269 mg/dL — ABNORMAL HIGH (ref 65–99)
Glucose-Capillary: 250 mg/dL — ABNORMAL HIGH (ref 65–99)
Glucose-Capillary: 235 mg/dL — ABNORMAL HIGH (ref 65–99)
Glucose-Capillary: 280 mg/dL — ABNORMAL HIGH (ref 65–99)
Glucose-Capillary: 315 mg/dL — ABNORMAL HIGH (ref 65–99)

## 2014-11-11 LAB — CBC WITH DIFFERENTIAL/PLATELET
BASOS ABS: 0 10*3/uL (ref 0.0–0.1)
Basophils Relative: 0 % (ref 0–1)
Eosinophils Absolute: 0.1 10*3/uL (ref 0.0–0.7)
Eosinophils Relative: 2 % (ref 0–5)
HCT: 39.3 % (ref 36.0–46.0)
HEMOGLOBIN: 12.7 g/dL (ref 12.0–15.0)
LYMPHS ABS: 2.3 10*3/uL (ref 0.7–4.0)
Lymphocytes Relative: 31 % (ref 12–46)
MCH: 29.3 pg (ref 26.0–34.0)
MCHC: 32.3 g/dL (ref 30.0–36.0)
MCV: 90.6 fL (ref 78.0–100.0)
Monocytes Absolute: 0.5 10*3/uL (ref 0.1–1.0)
Monocytes Relative: 6 % (ref 3–12)
NEUTROS ABS: 4.4 10*3/uL (ref 1.7–7.7)
NEUTROS PCT: 61 % (ref 43–77)
PLATELETS: 273 10*3/uL (ref 150–400)
RBC: 4.34 MIL/uL (ref 3.87–5.11)
RDW: 13.1 % (ref 11.5–15.5)
WBC: 7.3 10*3/uL (ref 4.0–10.5)

## 2014-11-11 LAB — COMPREHENSIVE METABOLIC PANEL
ALT: 42 U/L (ref 14–54)
AST: 26 U/L (ref 15–41)
Albumin: 3.5 g/dL (ref 3.5–5.0)
Alkaline Phosphatase: 61 U/L (ref 38–126)
Anion gap: 8 (ref 5–15)
BUN: 20 mg/dL (ref 6–20)
CALCIUM: 9.5 mg/dL (ref 8.9–10.3)
CO2: 25 mmol/L (ref 22–32)
CREATININE: 1.03 mg/dL — AB (ref 0.44–1.00)
Chloride: 105 mmol/L (ref 101–111)
GFR calc non Af Amer: 57 mL/min — ABNORMAL LOW (ref >60)
GLUCOSE: 252 mg/dL — AB (ref 65–99)
Potassium: 4.1 mmol/L (ref 3.5–5.1)
Sodium: 138 mmol/L (ref 135–145)
Total Bilirubin: 0.6 mg/dL (ref 0.3–1.2)
Total Protein: 6.9 g/dL (ref 6.5–8.1)

## 2014-11-11 LAB — HEMOGLOBIN AND HEMATOCRIT, BLOOD
HEMATOCRIT: 37 % (ref 36.0–46.0)
Hemoglobin: 12.2 g/dL (ref 12.0–15.0)

## 2014-11-11 SURGERY — GASTRECTOMY, SLEEVE, LAPAROSCOPIC
Anesthesia: General

## 2014-11-11 MED ORDER — 0.9 % SODIUM CHLORIDE (POUR BTL) OPTIME
TOPICAL | Status: DC | PRN
  Administered 2014-11-11: 1000 mL

## 2014-11-11 MED ORDER — UNJURY CHICKEN SOUP POWDER: 2.0000 [oz_av] | Freq: Four times a day (QID) | ORAL | Status: DC

## 2014-11-11 MED ORDER — LIDOCAINE HCL (CARDIAC) 20 MG/ML IV SOLN
INTRAVENOUS | Status: AC
  Filled 2014-11-11: qty 5

## 2014-11-11 MED ORDER — INSULIN ASPART 100 UNIT/ML ~~LOC~~ SOLN
0.0000 [IU] | SUBCUTANEOUS | Status: DC
Start: 2014-11-11 — End: 2014-11-12
  Administered 2014-11-11: 11 [IU] via SUBCUTANEOUS
  Administered 2014-11-11: 7 [IU] via SUBCUTANEOUS
  Administered 2014-11-11: 15 [IU] via SUBCUTANEOUS
  Administered 2014-11-12: 4 [IU] via SUBCUTANEOUS
  Administered 2014-11-12 (×2): 7 [IU] via SUBCUTANEOUS
  Administered 2014-11-12: 11 [IU] via SUBCUTANEOUS

## 2014-11-11 MED ORDER — DEXAMETHASONE SODIUM PHOSPHATE 10 MG/ML IJ SOLN
INTRAMUSCULAR | Status: DC | PRN
  Administered 2014-11-11: 10 mg via INTRAVENOUS

## 2014-11-11 MED ORDER — CETYLPYRIDINIUM CHLORIDE 0.05 % MT LIQD
7.0000 mL | Freq: Two times a day (BID) | OROMUCOSAL | Status: DC
  Administered 2014-11-11 – 2014-11-12 (×3): 7 mL via OROMUCOSAL

## 2014-11-11 MED ORDER — UNJURY CHOCOLATE CLASSIC POWDER: 2.0000 [oz_av] | Freq: Four times a day (QID) | ORAL | Status: DC

## 2014-11-11 MED ORDER — ROCURONIUM BROMIDE 100 MG/10ML IV SOLN
INTRAVENOUS | Status: DC | PRN
  Administered 2014-11-11: 5 mg via INTRAVENOUS
  Administered 2014-11-11: 50 mg via INTRAVENOUS

## 2014-11-11 MED ORDER — PROPOFOL 10 MG/ML IV BOLUS
INTRAVENOUS | Status: DC | PRN
  Administered 2014-11-11: 200 mg via INTRAVENOUS

## 2014-11-11 MED ORDER — INSULIN GLARGINE 100 UNIT/ML ~~LOC~~ SOLN
5.0000 [IU] | Freq: Every day | SUBCUTANEOUS | Status: DC
  Administered 2014-11-11: 5 [IU] via SUBCUTANEOUS
  Filled 2014-11-11 (×2): qty 0.05

## 2014-11-11 MED ORDER — PROMETHAZINE HCL 25 MG/ML IJ SOLN
6.2500 mg | INTRAMUSCULAR | Status: AC | PRN
  Administered 2014-11-11 (×2): 6.25 mg via INTRAVENOUS

## 2014-11-11 MED ORDER — INSULIN ASPART 100 UNIT/ML ~~LOC~~ SOLN
SUBCUTANEOUS | Status: AC
  Filled 2014-11-11: qty 1

## 2014-11-11 MED ORDER — PANTOPRAZOLE SODIUM 40 MG IV SOLR
40.0000 mg | Freq: Every day | INTRAVENOUS | Status: DC
  Administered 2014-11-11 – 2014-11-12 (×2): 40 mg via INTRAVENOUS
  Filled 2014-11-11 (×3): qty 40

## 2014-11-11 MED ORDER — FENTANYL CITRATE (PF) 100 MCG/2ML IJ SOLN
INTRAMUSCULAR | Status: DC | PRN
  Administered 2014-11-11: 50 ug via INTRAVENOUS
  Administered 2014-11-11: 100 ug via INTRAVENOUS
  Administered 2014-11-11 (×2): 50 ug via INTRAVENOUS

## 2014-11-11 MED ORDER — SCOPOLAMINE 1 MG/3DAYS TD PT72
MEDICATED_PATCH | TRANSDERMAL | Status: AC
Start: 2014-11-11 — End: 2014-11-11
  Filled 2014-11-11: qty 1

## 2014-11-11 MED ORDER — BUPIVACAINE LIPOSOME 1.3 % IJ SUSP
20.0000 mL | Freq: Once | INTRAMUSCULAR | Status: DC
  Filled 2014-11-11: qty 20

## 2014-11-11 MED ORDER — ENOXAPARIN SODIUM 30 MG/0.3ML ~~LOC~~ SOLN
30.0000 mg | Freq: Two times a day (BID) | SUBCUTANEOUS | Status: DC
  Administered 2014-11-12 – 2014-11-13 (×3): 30 mg via SUBCUTANEOUS
  Filled 2014-11-11 (×5): qty 0.3

## 2014-11-11 MED ORDER — OXYCODONE HCL 5 MG/5ML PO SOLN: 5.0000 mg | ORAL | Status: DC | PRN

## 2014-11-11 MED ORDER — INSULIN ASPART 100 UNIT/ML ~~LOC~~ SOLN
SUBCUTANEOUS | Status: DC | PRN
  Administered 2014-11-11: 5 [IU] via SUBCUTANEOUS

## 2014-11-11 MED ORDER — PROPOFOL 10 MG/ML IV BOLUS
INTRAVENOUS | Status: AC
  Filled 2014-11-11: qty 20

## 2014-11-11 MED ORDER — DEXAMETHASONE SODIUM PHOSPHATE 10 MG/ML IJ SOLN
INTRAMUSCULAR | Status: AC
  Filled 2014-11-11: qty 1

## 2014-11-11 MED ORDER — INSULIN NPH (HUMAN) (ISOPHANE) 100 UNIT/ML ~~LOC~~ SUSP: 5.0000 [IU] | Freq: Once | SUBCUTANEOUS | Status: DC

## 2014-11-11 MED ORDER — SODIUM CHLORIDE 0.9 % IJ SOLN
INTRAMUSCULAR | Status: DC | PRN
  Administered 2014-11-11: 50 mL via INTRAVENOUS

## 2014-11-11 MED ORDER — PHENYLEPHRINE 40 MCG/ML (10ML) SYRINGE FOR IV PUSH (FOR BLOOD PRESSURE SUPPORT)
PREFILLED_SYRINGE | INTRAVENOUS | Status: AC
  Filled 2014-11-11: qty 10

## 2014-11-11 MED ORDER — ACETAMINOPHEN 160 MG/5ML PO SOLN: 650.0000 mg | ORAL | Status: DC | PRN

## 2014-11-11 MED ORDER — UNJURY VANILLA POWDER: 2.0000 [oz_av] | Freq: Four times a day (QID) | ORAL | Status: DC

## 2014-11-11 MED ORDER — SODIUM CHLORIDE 0.9 % IJ SOLN
INTRAMUSCULAR | Status: AC
  Filled 2014-11-11: qty 50

## 2014-11-11 MED ORDER — PHENYLEPHRINE HCL 10 MG/ML IJ SOLN
INTRAMUSCULAR | Status: DC | PRN
  Administered 2014-11-11 (×2): 80 ug via INTRAVENOUS

## 2014-11-11 MED ORDER — METHOCARBAMOL 1000 MG/10ML IJ SOLN
500.0000 mg | Freq: Three times a day (TID) | INTRAVENOUS | Status: DC
  Administered 2014-11-11 – 2014-11-13 (×7): 500 mg via INTRAVENOUS
  Filled 2014-11-11 (×8): qty 5

## 2014-11-11 MED ORDER — MIDAZOLAM HCL 2 MG/2ML IJ SOLN: 0.5000 mg | Freq: Once | INTRAMUSCULAR | Status: DC | PRN

## 2014-11-11 MED ORDER — ROCURONIUM BROMIDE 100 MG/10ML IV SOLN
INTRAVENOUS | Status: AC
  Filled 2014-11-11: qty 1

## 2014-11-11 MED ORDER — HEPARIN SODIUM (PORCINE) 5000 UNIT/ML IJ SOLN
5000.0000 [IU] | INTRAMUSCULAR | Status: AC
  Administered 2014-11-11: 5000 [IU] via SUBCUTANEOUS
  Filled 2014-11-11: qty 1

## 2014-11-11 MED ORDER — CHLORHEXIDINE GLUCONATE 4 % EX LIQD: 60.0000 mL | Freq: Once | CUTANEOUS | Status: DC

## 2014-11-11 MED ORDER — GLYCOPYRROLATE 0.2 MG/ML IJ SOLN
INTRAMUSCULAR | Status: DC | PRN
  Administered 2014-11-11: 0.6 mg via INTRAVENOUS

## 2014-11-11 MED ORDER — PROMETHAZINE HCL 25 MG/ML IJ SOLN
INTRAMUSCULAR | Status: AC
  Filled 2014-11-11: qty 1

## 2014-11-11 MED ORDER — LABETALOL HCL 5 MG/ML IV SOLN
INTRAVENOUS | Status: AC
  Filled 2014-11-11: qty 4

## 2014-11-11 MED ORDER — FENTANYL CITRATE (PF) 250 MCG/5ML IJ SOLN
INTRAMUSCULAR | Status: AC
  Filled 2014-11-11: qty 5

## 2014-11-11 MED ORDER — GLYCOPYRROLATE 0.2 MG/ML IJ SOLN
INTRAMUSCULAR | Status: AC
  Filled 2014-11-11: qty 3

## 2014-11-11 MED ORDER — PROPOFOL 10 MG/ML IV BOLUS
INTRAVENOUS | Status: AC
Start: 2014-11-11 — End: 2014-11-11
  Filled 2014-11-11: qty 20

## 2014-11-11 MED ORDER — SODIUM CHLORIDE 0.9 % IV SOLN
10.0000 mg | INTRAVENOUS | Status: DC | PRN
  Administered 2014-11-11: 25 ug/min via INTRAVENOUS

## 2014-11-11 MED ORDER — EPHEDRINE SULFATE 50 MG/ML IJ SOLN
INTRAMUSCULAR | Status: AC
  Filled 2014-11-11: qty 1

## 2014-11-11 MED ORDER — LACTATED RINGERS IR SOLN
Status: DC | PRN
  Administered 2014-11-11: 1000 mL

## 2014-11-11 MED ORDER — MORPHINE SULFATE 10 MG/ML IJ SOLN
2.0000 mg | INTRAMUSCULAR | Status: DC | PRN
Start: 2014-11-11 — End: 2014-11-13
  Administered 2014-11-11 (×2): 6 mg via INTRAVENOUS
  Administered 2014-11-11: 4 mg via INTRAVENOUS
  Administered 2014-11-11: 2 mg via INTRAVENOUS
  Administered 2014-11-11 – 2014-11-12 (×4): 6 mg via INTRAVENOUS
  Administered 2014-11-13: 2 mg via INTRAVENOUS
  Filled 2014-11-11 (×9): qty 1

## 2014-11-11 MED ORDER — ONDANSETRON HCL 4 MG/2ML IJ SOLN
INTRAMUSCULAR | Status: DC | PRN
  Administered 2014-11-11: 4 mg via INTRAVENOUS

## 2014-11-11 MED ORDER — BUPIVACAINE LIPOSOME 1.3 % IJ SUSP
INTRAMUSCULAR | Status: DC | PRN
  Administered 2014-11-11: 20 mL

## 2014-11-11 MED ORDER — MIDAZOLAM HCL 2 MG/2ML IJ SOLN
INTRAMUSCULAR | Status: AC
  Filled 2014-11-11: qty 2

## 2014-11-11 MED ORDER — ACETAMINOPHEN 160 MG/5ML PO SOLN: 325.0000 mg | ORAL | Status: DC | PRN

## 2014-11-11 MED ORDER — INSULIN ASPART 100 UNIT/ML ~~LOC~~ SOLN
5.0000 [IU] | Freq: Once | SUBCUTANEOUS | Status: AC
  Administered 2014-11-11: 5 [IU] via SUBCUTANEOUS
  Filled 2014-11-11: qty 1

## 2014-11-11 MED ORDER — NEOSTIGMINE METHYLSULFATE 10 MG/10ML IV SOLN
INTRAVENOUS | Status: DC | PRN
  Administered 2014-11-11: 5 mg via INTRAVENOUS

## 2014-11-11 MED ORDER — SODIUM CHLORIDE 0.9 % IJ SOLN
INTRAMUSCULAR | Status: AC
  Filled 2014-11-11: qty 10

## 2014-11-11 MED ORDER — HYDROMORPHONE HCL 1 MG/ML IJ SOLN: 0.2500 mg | INTRAMUSCULAR | Status: DC | PRN

## 2014-11-11 MED ORDER — MIDAZOLAM HCL 5 MG/5ML IJ SOLN
INTRAMUSCULAR | Status: DC | PRN
  Administered 2014-11-11: 2 mg via INTRAVENOUS

## 2014-11-11 MED ORDER — SUCCINYLCHOLINE CHLORIDE 20 MG/ML IJ SOLN
INTRAMUSCULAR | Status: DC | PRN
  Administered 2014-11-11: 140 mg via INTRAVENOUS

## 2014-11-11 MED ORDER — PROMETHAZINE HCL 25 MG/ML IJ SOLN
12.5000 mg | Freq: Four times a day (QID) | INTRAMUSCULAR | Status: DC | PRN
  Administered 2014-11-12 (×2): 12.5 mg via INTRAVENOUS
  Filled 2014-11-11 (×2): qty 1

## 2014-11-11 MED ORDER — MEPERIDINE HCL 50 MG/ML IJ SOLN: 6.2500 mg | INTRAMUSCULAR | Status: DC | PRN

## 2014-11-11 MED ORDER — LACTATED RINGERS IV SOLN
INTRAVENOUS | Status: DC
  Administered 2014-11-11: 1000 mL via INTRAVENOUS

## 2014-11-11 MED ORDER — LABETALOL HCL 5 MG/ML IV SOLN
INTRAVENOUS | Status: DC | PRN
  Administered 2014-11-11 (×2): 2.5 mg via INTRAVENOUS
  Administered 2014-11-11: 5 mg via INTRAVENOUS

## 2014-11-11 MED ORDER — POTASSIUM CHLORIDE IN NACL 20-0.45 MEQ/L-% IV SOLN
INTRAVENOUS | Status: DC
  Administered 2014-11-11: 16:00:00 via INTRAVENOUS
  Administered 2014-11-11 – 2014-11-12 (×3): 125 mL/h via INTRAVENOUS
  Administered 2014-11-13 (×2): via INTRAVENOUS
  Filled 2014-11-11 (×8): qty 1000

## 2014-11-11 MED ORDER — ONDANSETRON HCL 4 MG/2ML IJ SOLN
INTRAMUSCULAR | Status: AC
  Filled 2014-11-11: qty 2

## 2014-11-11 MED ORDER — EPHEDRINE SULFATE 50 MG/ML IJ SOLN
INTRAMUSCULAR | Status: DC | PRN
  Administered 2014-11-11: 10 mg via INTRAVENOUS

## 2014-11-11 MED ORDER — ONDANSETRON HCL 4 MG/2ML IJ SOLN
4.0000 mg | INTRAMUSCULAR | Status: DC | PRN
  Administered 2014-11-11 – 2014-11-12 (×4): 4 mg via INTRAVENOUS
  Filled 2014-11-11 (×4): qty 2

## 2014-11-11 MED ORDER — TISSEEL VH 10 ML EX KIT
PACK | CUTANEOUS | Status: AC
  Filled 2014-11-11: qty 1

## 2014-11-11 MED ORDER — PHENYLEPHRINE HCL 10 MG/ML IJ SOLN
INTRAMUSCULAR | Status: AC
  Filled 2014-11-11: qty 1

## 2014-11-11 MED ORDER — LIDOCAINE HCL (CARDIAC) 20 MG/ML IV SOLN
INTRAVENOUS | Status: DC | PRN
  Administered 2014-11-11: 75 mg via INTRAVENOUS

## 2014-11-11 SURGICAL SUPPLY — 60 items
APPLICATOR COTTON TIP 6IN STRL (MISCELLANEOUS) IMPLANT
APPLIER CLIP ROT 10 11.4 M/L (STAPLE)
BLADE SURG SZ11 CARB STEEL (BLADE) ×4 IMPLANT
CABLE HIGH FREQUENCY MONO STRZ (ELECTRODE) ×4 IMPLANT
CHLORAPREP W/TINT 26ML (MISCELLANEOUS) ×8 IMPLANT
CLIP APPLIE ROT 10 11.4 M/L (STAPLE) IMPLANT
COVER SURGICAL LIGHT HANDLE (MISCELLANEOUS) IMPLANT
DERMABOND ADVANCED (GAUZE/BANDAGES/DRESSINGS) ×2
DERMABOND ADVANCED .7 DNX12 (GAUZE/BANDAGES/DRESSINGS) ×2 IMPLANT
DEVICE SUT QUICK LOAD TK 5 (STAPLE) IMPLANT
DEVICE SUT TI-KNOT TK 5X26 (MISCELLANEOUS) IMPLANT
DEVICE SUTURE ENDOST 10MM (ENDOMECHANICALS) IMPLANT
DEVICE TI KNOT TK5 (MISCELLANEOUS)
DEVICE TROCAR PUNCTURE CLOSURE (ENDOMECHANICALS) ×4 IMPLANT
DRAPE CAMERA CLOSED 9X96 (DRAPES) ×4 IMPLANT
DRAPE UTILITY XL STRL (DRAPES) ×8 IMPLANT
ELECT REM PT RETURN 9FT ADLT (ELECTROSURGICAL) ×4
ELECTRODE REM PT RTRN 9FT ADLT (ELECTROSURGICAL) ×2 IMPLANT
GAUZE SPONGE 4X4 12PLY STRL (GAUZE/BANDAGES/DRESSINGS) IMPLANT
GLOVE BIOGEL M STRL SZ7.5 (GLOVE) ×4 IMPLANT
GOWN STRL REUS W/TWL XL LVL3 (GOWN DISPOSABLE) ×16 IMPLANT
HOVERMATT SINGLE USE (MISCELLANEOUS) ×4 IMPLANT
KIT BASIN OR (CUSTOM PROCEDURE TRAY) ×4 IMPLANT
NEEDLE SPNL 22GX3.5 QUINCKE BK (NEEDLE) ×4 IMPLANT
PACK UNIVERSAL I (CUSTOM PROCEDURE TRAY) ×4 IMPLANT
PEN SKIN MARKING BROAD (MISCELLANEOUS) ×4 IMPLANT
QUICK LOAD TK 5 (STAPLE)
RELOAD STAPLER BLUE 60MM (STAPLE) ×4 IMPLANT
RELOAD STAPLER GOLD 60MM (STAPLE) ×2 IMPLANT
RELOAD STAPLER GREEN 60MM (STAPLE) ×6 IMPLANT
SCISSORS LAP 5X45 EPIX DISP (ENDOMECHANICALS) ×4 IMPLANT
SEALANT SURGICAL APPL DUAL CAN (MISCELLANEOUS) IMPLANT
SET IRRIG TUBING LAPAROSCOPIC (IRRIGATION / IRRIGATOR) ×4 IMPLANT
SHEARS CURVED HARMONIC AC 45CM (MISCELLANEOUS) ×4 IMPLANT
SLEEVE ADV FIXATION 5X100MM (TROCAR) ×8 IMPLANT
SLEEVE GASTRECTOMY 36FR VISIGI (MISCELLANEOUS) ×4 IMPLANT
SLEEVE XCEL OPT CAN 5 100 (ENDOMECHANICALS) IMPLANT
SOLUTION ANTI FOG 6CC (MISCELLANEOUS) ×4 IMPLANT
SPONGE LAP 18X18 X RAY DECT (DISPOSABLE) ×4 IMPLANT
STAPLER ECHELON BIOABSB 60 FLE (MISCELLANEOUS) ×12 IMPLANT
STAPLER ECHELON LONG 60 440 (INSTRUMENTS) IMPLANT
STAPLER RELOAD BLUE 60MM (STAPLE) ×8
STAPLER RELOAD GOLD 60MM (STAPLE) ×4
STAPLER RELOAD GREEN 60MM (STAPLE) ×12
SUT MNCRL AB 4-0 PS2 18 (SUTURE) ×4 IMPLANT
SUT SURGIDAC NAB ES-9 0 48 120 (SUTURE) IMPLANT
SUT VICRYL 0 TIES 12 18 (SUTURE) ×4 IMPLANT
SYR 10ML ECCENTRIC (SYRINGE) ×4 IMPLANT
SYR 20CC LL (SYRINGE) ×4 IMPLANT
SYR 50ML LL SCALE MARK (SYRINGE) ×4 IMPLANT
TOWEL OR 17X26 10 PK STRL BLUE (TOWEL DISPOSABLE) ×4 IMPLANT
TOWEL OR NON WOVEN STRL DISP B (DISPOSABLE) ×4 IMPLANT
TRAY FOLEY W/METER SILVER 14FR (SET/KITS/TRAYS/PACK) IMPLANT
TROCAR ADV FIXATION 5X100MM (TROCAR) ×4 IMPLANT
TROCAR BLADELESS 15MM (ENDOMECHANICALS) ×4 IMPLANT
TROCAR BLADELESS OPT 5 100 (ENDOMECHANICALS) ×4 IMPLANT
TUBING CONNECTING 10 (TUBING) ×3 IMPLANT
TUBING CONNECTING 10' (TUBING) ×1
TUBING ENDO SMARTCAP (MISCELLANEOUS) ×4 IMPLANT
TUBING FILTER THERMOFLATOR (ELECTROSURGICAL) ×4 IMPLANT

## 2014-11-11 NOTE — Transfer of Care (Signed)
Immediate Anesthesia Transfer of Care Note  Patient: Emily Leon  Procedure(s) Performed: Procedure(s): LAPAROSCOPIC GASTRIC SLEEVE RESECTION (N/A) UPPER GI ENDOSCOPY  Patient Location: PACU  Anesthesia Type:General  Level of Consciousness:  sedated, patient cooperative and responds to stimulation  Airway & Oxygen Therapy:Patient Spontanous Breathing and Patient connected to face mask oxgen  Post-op Assessment:  Report given to PACU RN and Post -op Vital signs reviewed and stable  Post vital signs:  Reviewed and stable  Last Vitals:  Filed Vitals:   11/11/14 0912  BP: 170/74  Pulse: 85  Temp: 36.6 C  Resp: 18    Complications: No apparent anesthesia complications

## 2014-11-11 NOTE — Progress Notes (Signed)
RT instructed patient on use/technique of flutter valve and patient demonstrated proper technique.  Patient tolerated well and was encouraged to use flutter.

## 2014-11-11 NOTE — Anesthesia Preprocedure Evaluation (Addendum)
Anesthesia Evaluation  Patient identified by MRN, date of birth, ID band Patient awake    Reviewed: Allergy & Precautions, NPO status , Patient's Chart, lab work & pertinent test results  History of Anesthesia Complications Negative for: history of anesthetic complications  Airway Mallampati: I  TM Distance: >3 FB Neck ROM: Full    Dental  (+) Dental Advisory Given   Pulmonary shortness of breath, sleep apnea and Continuous Positive Airway Pressure Ventilation ,  breath sounds clear to auscultation        Cardiovascular hypertension, Pt. on medications and Pt. on home beta blockers - anginaRhythm:Regular Rate:Normal     Neuro/Psych negative neurological ROS     GI/Hepatic negative GI ROS, Neg liver ROS,   Endo/Other  diabetes (glu 266), Insulin DependentMorbid obesity  Renal/GU negative Renal ROS     Musculoskeletal  (+) Arthritis -, Osteoarthritis,    Abdominal (+) + obese,   Peds  Hematology negative hematology ROS (+)   Anesthesia Other Findings   Reproductive/Obstetrics                           Anesthesia Physical Anesthesia Plan  ASA: III  Anesthesia Plan: General   Post-op Pain Management:    Induction: Intravenous  Airway Management Planned: Oral ETT  Additional Equipment:   Intra-op Plan:   Post-operative Plan: Extubation in OR  Informed Consent: I have reviewed the patients History and Physical, chart, labs and discussed the procedure including the risks, benefits and alternatives for the proposed anesthesia with the patient or authorized representative who has indicated his/her understanding and acceptance.   Dental advisory given  Plan Discussed with: CRNA and Surgeon  Anesthesia Plan Comments: (Plan routine monitors, GETA)        Anesthesia Quick Evaluation

## 2014-11-11 NOTE — Progress Notes (Signed)
Utilization review completed.  

## 2014-11-11 NOTE — Op Note (Signed)
11/11/2014 Emily Leon [birth year] [id number]   PRE-OPERATIVE DIAGNOSIS:   Morbid obesity BMI 45 OSA on CPAP Fatty liver Hypertension Insulin dependent diabetes mellitus hyperlipidemia  POST-OPERATIVE DIAGNOSIS:  same  PROCEDURE:  Procedure(s): LAPAROSCOPIC SLEEVE GASTRECTOMY  UPPER GI ENDOSCOPY  SURGEON:  Surgeon(s): [name], MD FACS FASMBS  ASSISTANTS: Ovidio Kin MD FACS  ANESTHESIA:   General + 70 cc exparel  DRAINS: none   BOUGIE: 36 fr ViSiGi  LOCAL MEDICATIONS USED:  MARCAINE + Exparel  SPECIMEN:  Source of Specimen:  Greater curvature of stomach  DISPOSITION OF SPECIMEN:  PATHOLOGY  COUNTS:  YES  INDICATION FOR PROCEDURE: This is a very pleasant 64 year old morbidly obese AAF who has had unsuccessful attempts for sustained weight loss. She presents today for a planned laparoscopic sleeve gastrectomy with upper endoscopy. We have discussed the risk and benefits of the procedure extensively preoperatively. Please see my separate notes.  PROCEDURE: After obtaining informed consent and receiving 5000 units of subcutaneous heparin, the patient was brought to the operating room at [facility] and placed supine on the operating room table. General endotracheal anesthesia was established. Sequential compression devices were placed.  A orogastric tube was placed. The patient's abdomen was prepped and draped in the usual standard surgical fashion. She received preoperative IV antibiotics. A surgical timeout was performed.  Access to the abdomen was achieved using a 5 mm 0 laparoscope thru a 5 mm trocar In the left upper Quadrant 2 fingerbreadths below the left subcostal margin using the Optiview technique. Pneumoperitoneum was smoothly established up to 15 mm of mercury. The laparoscope was advanced and the abdominal cavity was surveilled. There was evidence of a hiatal hernia on laparoscopy - gap in the left and right crus anteriorly.  A 5 mm trocar was  placed slightly above and to the left of the umbilicus under direct visualization. The patient was then placed in reverse Trendelenburg. The [facility] liver retractor was placed under the left lobe of the liver through a 5 mm trocar incision site in the subxiphoid position. A 5 mm trocar was placed in the lateral right upper quadrant along with a 15 mm trocar in the mid right abdomen  All under direct visualization after local had been infiltrated.  The stomach was inspected. It was completely decompressed and the orogastric tube was removed.  There was no anterior dimple that was obviously visible. Her UGI showed no hiatal hernia and she had no GERD symptoms.    We identified the pylorus and measured 6 cm proximal to the pylorus and identified an area of where we would start taking down the short gastric vessels. Harmonic scalpel was used to take down the short gastric vessels along the greater curvature of the stomach. We were able to enter the lesser sac. We continued to march along the greater curvature of the stomach taking down the short gastrics. As we approached the gastrosplenic ligament we took care in this area not to injure the spleen. We were able to take down the entire gastrosplenic ligament. We then mobilized the fundus away from the left crus of diaphragm. There were not any significant posterior gastric avascular attachments. This left the stomach completely mobilized. No vessels had been taken down along the lesser curvature of the stomach.  We then reidentified the pylorus. A 36Fr ViSiGi was then placed in the oropharynx and advanced down into the stomach and placed in the distal antrum and positioned along the lesser curvature. It was placed under suction  which secured the 36Fr ViSiGi in place along the lesser curve. Then using the [employer] echelon 60 mm stapler with a green load with Seamguard, I placed a stapler along the antrum approximately 5 cm from the pylorus. The stapler was  angled so that there is ample room at the angularis incisura. I then fired the first staple load after inspecting it posteriorly to ensure adequate space both anteriorly and posteriorly. At this point I still was not completely past the angularis so with another green load with Seamguard, I placed the stapler in position just inside the prior stapleline. We then rotated the stomach to insure that there was adequate anteriorly as well as posteriorly. The stapler was then fired. I used another 60mm green cartridge with seamguard. At this point I started using 60 mm gold load staple cartridges with Seamguard. The echelon stapler was then repositioned with a 60 mm gold load with Seamguard and we continued to march up along the ViSiGi. My assistant was holding traction along the greater curvature stomach along the cauterized short gastric vessels ensuring that the stomach was symmetrically retracted. Prior to each firing of the staple, we rotated the stomach to ensure that there is adequate stomach left.  As we approached the fundus, I used 60 mm blue cartridge with Seamguard aiming slightly lateral to the esophageal fat pad. Although the staples on this fire had completely gone thru the last part of the stomach it had not completely cut it. Therefore 1 additional 60 blue load was used to free the remaining stomach. The sleeve was inspected. There is no evidence of cork screw. The staple line appeared hemostatic. The CRNA inflated the ViSiGi to the green zone and the upper abdomen was flooded with saline. There were no bubbles. The sleeve was decompressed and the ViSiGi removed. My assistant scrubbed out and performed an upper endoscopy. The sleeve easily distended with air and the scope was easily advanced to the pylorus. There is no evidence of internal bleeding or cork screwing. There was no narrowing at the angularis. There is no evidence of bubbles. Please see his operative note for further details. The gastric  sleeve was decompressed and the endoscope was removed.  The greater curvature the stomach was grasped with a laparoscopic grasper and removed from the 15 mm trocar site.  The liver retractor was removed. I then closed the 15 mm trocar site with 3 interrupted 0 Vicryl sutures through the fascia using the endoclose. The closure was viewed laparoscopically and it was airtight. 70 cc of Exparel was then infiltrated in the preperitoneal spaces around the trocar sites. Pneumoperitoneum was released. All trocar sites were closed with a 4-0 Monocryl in a subcuticular fashion followed by the application of Dermabond. The patient was extubated and taken to the recovery room in stable condition. All needle, instrument, and sponge counts were correct x2. There are no immediate complications  (3) 60 mm green with Seamguard (1) 60 mm gold with seamguard (2) 60 mm blue with 1 seamguard  PLAN OF CARE: Admit to inpatient   PATIENT DISPOSITION:  PACU - hemodynamically stable.   Delay start of Pharmacological VTE agent (>24hrs) due to surgical blood loss or risk of bleeding:  no  [name]. [name], MD, FACS General, Bariatric, & Minimally Invasive Surgery [facility], [place]

## 2014-11-11 NOTE — Progress Notes (Signed)
Patient was asked about CPAP and declined the offer of using CPAP at night.

## 2014-11-11 NOTE — H&P (View-Only) (Signed)
Emily Leon 09/18/2014 11:33 AM Location: [facility] Patient #: [id number] DOB: 10/30/1950 Married / Language: English / Race: Black or African American Female History of Present Illness (Emily Leon Emily. Emily Grandmaison Leon; 09/18/2014 1:42 PM) Patient words: bariatric.  The patient is a 64 year old female who presents for a pre-op visit. She comes in today for her preoperative visit. She has been approved for laparoscopic sleeve gastrectomy on June 13. Initially met her November 14, 2013. Her weight at that time was 308 pounds. She denies any medical changes or trips to the emergency room or hospital since I initially met her last summer. She states that she did have a change to her diabetes medication by her endocrinologist. She denies any chest pain or chest pressure. She denies any shortness of breath. She does not get short of breath while walking. She may get some fatigue. She denies any amaurosis fugax or TIAs. She denies any reflux.  Her upper GI and chest x-ray were within normal limits. Her ultrasound of her abdomen showed a single gallstone and evidence of fatty liver. Her blood work that was done both in October and in January 2016 showed a hemoglobin A1c of 10.6. Vitamin D level of 19.9. Normal CBC. Lipid panel was within normal limits. Her kidney function was normal. Her BUN was 17 her creatinine was 1. Her H. pylori antibody was positive. She has completed her H. pylori treatment which was completed last Friday. 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 PYLORI TEST (041.86  A04.8)  Other Problems (Emily Leon Emily Emily Hammersmith, Leon; 09/18/2014 1:41 PM) Arthritis Back Pain Diabetes Mellitus High blood  pressure Hypercholesterolemia Hypertension Fatigue Chronic kidney disease  Past Surgical History (Emily Leon, CMA; 09/18/2014 11:33 AM) Knee Surgery Left.  Diagnostic Studies History (Emily Leon, CMA; 09/18/2014 11:33 AM) Colonoscopy 5-10 years ago Mammogram within last year Pap Smear 1-5 years ago  Allergies (Emily Leon, CMA; 09/18/2014 11:34 AM) Penicillin G Benzathine *PENICILLINS* Sulfacetamide *CHEMICALS*  Medication History (Emily Leon Emily Emily Vahle, Leon; 09/18/2014 1:41 PM) Atenolol (100MG Tablet, Oral) Active. AmLODIPine Besylate (10MG Tablet, Oral) Active. HumuLIN R U-500 (CONCENTRATED) (500UNIT/ML Solution, Subcutaneous) Active. Vytorin (10-40MG Tablet, Oral) Active. Hydrochlorothiazide (25MG Tablet, Oral) Active. Valsartan (160MG Tablet, Oral) Active. Medications Reconciled OxyCODONE HCl (5MG/5ML Solution, 5-10 Milliliter Oral every four hours, as needed, Taken starting 09/18/2014) Active. PriLOSEC (20MG Capsule DR, 1 (one) Capsule DR Oral two times daily, Taken starting 08/26/2014) Active. Diflucan (150MG Tablet, one Tablet Oral one time dose, Taken starting 09/04/2014) Active.  Social History (Emily Leon, CMA; 09/18/2014 11:33 AM) Caffeine use Coffee. No alcohol use Tobacco use Never smoker.  Family History (Emily Leon, CMA; 09/18/2014 11:33 AM) Arthritis Father. Diabetes Mellitus Father. Heart Disease Father, Mother. Heart disease in female family member before age 65 Hypertension Father, Mother.  Pregnancy / Birth History (Emily Leon, CMA; 09/18/2014 11:33 AM) Age at menarche 17 years. Gravida 1 Maternal age 15-20 Para 1     Review of Systems (Emily Leon CMA; 09/18/2014 11:33 AM) General Present- Night Sweats. Not Present- Appetite Loss, Chills, Fatigue, Fever, Weight Gain and Weight Loss. HEENT Present- Wears glasses/contact lenses. Not Present- Earache, Hearing Loss, Hoarseness, Nose Bleed, Oral Ulcers, Ringing in the Ears, Seasonal  Allergies, Sinus Pain, Sore Throat, Visual Disturbances   and Yellow Eyes. Respiratory Present- Snoring. Not Present- Bloody sputum, Chronic Cough, Difficulty Breathing and Wheezing. Breast Not Present- Breast Mass, Breast Pain, Nipple Discharge and Skin Changes. Cardiovascular Present- Shortness of Breath and Swelling of Extremities. Not Present- Chest Pain, Difficulty Breathing Lying Down, Leg Cramps, Palpitations and Rapid Heart Rate. Gastrointestinal Not Present- Abdominal Pain, Bloating, Bloody Stool, Change in Bowel Habits, Chronic diarrhea, Constipation, Difficulty Swallowing, Excessive gas, Gets full quickly at meals, Hemorrhoids, Indigestion, Nausea, Rectal Pain and Vomiting. Female Genitourinary Present- Nocturia. Not Present- Frequency, Painful Urination, Pelvic Pain and Urgency. Musculoskeletal Present- Back Pain. Not Present- Joint Pain, Joint Stiffness, Muscle Pain, Muscle Weakness and Swelling of Extremities. Neurological Present- Trouble walking. Not Present- Decreased Memory, Fainting, Headaches, Numbness, Seizures, Tingling, Tremor and Weakness. Psychiatric Not Present- Anxiety, Bipolar, Change in Sleep Pattern, Depression, Fearful and Frequent crying. Hematology Not Present- Easy Bruising, Excessive bleeding, Gland problems, HIV and Persistent Infections.  Vitals (Emily Leon CMA; 09/18/2014 11:34 AM) 09/18/2014 11:33 AM Weight: 313.6 lb Height: 69in Body Surface Area: 2.63 Emily Body Mass Index: 46.31 kg/Emily Temp.: 97.6F(Temporal)  Pulse: 76 (Regular)  BP: 138/82 (Sitting, Left Arm, Standard)     Physical Exam (Emily Leon Emily. Emily Leon; 09/18/2014 1:37 PM)  General Mental Status-Alert. General Appearance-Consistent with stated age. Hydration-Well hydrated. Voice-Normal. Note: Morbidly obese   Head and Neck Head-normocephalic, atraumatic with no lesions or palpable masses. Trachea-midline. Thyroid Gland Characteristics - normal size and  consistency.  Eye Eyeball - Bilateral-Extraocular movements intact. Sclera/Conjunctiva - Bilateral-No scleral icterus.  Chest and Lung Exam Chest and lung exam reveals -quiet, even and easy respiratory effort with no use of accessory muscles and on auscultation, normal breath sounds, no adventitious sounds and normal vocal resonance. Inspection Chest Wall - Normal. Back - normal.  Breast - Did not examine.  Cardiovascular Cardiovascular examination reveals -normal heart sounds, regular rate and rhythm with no murmurs and normal pedal pulses bilaterally.  Abdomen Inspection Inspection of the abdomen reveals - No Hernias. Skin - Scar - no surgical scars. Palpation/Percussion Palpation and Percussion of the abdomen reveal - Soft, Non Tender, No Rebound tenderness, No Rigidity (guarding) and No hepatosplenomegaly. Auscultation Auscultation of the abdomen reveals - Bowel sounds normal.  Peripheral Vascular Upper Extremity Palpation - Pulses bilaterally normal.  Neurologic Neurologic evaluation reveals -alert and oriented x 3 with no impairment of recent or remote memory. Mental Status-Normal.  Neuropsychiatric The patient's mood and affect are described as -normal. Judgment and Insight-insight is appropriate concerning matters relevant to self.  Musculoskeletal Normal Exam - Left-Upper Extremity Strength Normal and Lower Extremity Strength Normal. Normal Exam - Right-Upper Extremity Strength Normal and Lower Extremity Strength Normal.  Lymphatic Head & Neck  General Head & Neck Lymphatics: Bilateral - Description - Normal. Axillary - Did not examine. Femoral & Inguinal - Did not examine.    Assessment & Plan (Jeren Dufrane Emily. Erica Osuna Leon; 09/18/2014 1:42 PM)  MORBID OBESITY WITH BMI OF 45.0-49.9, ADULT (278.01  E66.01) Impression: We discussed the results of her workup. It appears that she is ready for surgery. We discussed the importance of the preoperative  diet. All of her questions were addressed and answered. We rediscussed the typical postoperative pathway. She was given her postoperative pain medicine prescription today. I encouraged her to contact the office should she have any questions between now and surgery. She does have a diagnosis of chronic kidney disease in her electronic chart in Epic however I can't find any instance of kidney failure. Moreover she has a normal BUN   Current Plans  Pt Education - EMW_preopbariatric Started OxyCODONE HCl 5MG/5ML, 5-10 Milliliter every four hours, as needed, 200 Milliliter, 09/18/2014, No Refill. POSITIVE H. PYLORI TEST (041.86  A04.8) Impression: She has completed treatment. We will send the extracted stomach to pathology and have been tested for H. 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 We discussed that she will need to be in contact with her endocrinologist more frequently after surgery because of the change in her diabetes medication HYPERTENSION, BENIGN (401.1  I10) OSA ON CPAP (327.23  G47.33) FATTY LIVER (571.8  K76.0)  Emily Ruff. Redmond Pulling, Leon, FACS General, Bariatric, & Minimally Invasive Surgery [facility], [place]

## 2014-11-11 NOTE — Anesthesia Postprocedure Evaluation (Signed)
  Anesthesia Post-op Note  Patient: Emily Leon  Procedure(s) Performed: Procedure(s): LAPAROSCOPIC GASTRIC SLEEVE RESECTION (N/A) UPPER GI ENDOSCOPY  Patient Location: PACU  Anesthesia Type:General  Level of Consciousness: awake, alert , oriented and patient cooperative  Airway and Oxygen Therapy: Patient Spontanous Breathing and Patient connected to nasal cannula oxygen  Post-op Pain: mild  Post-op Assessment: Post-op Vital signs reviewed, Patient's Cardiovascular Status Stable, Respiratory Function Stable, Patent Airway, No signs of Nausea or vomiting and Pain level controlled              Post-op Vital Signs: Reviewed and stable  Last Vitals:  Filed Vitals:   11/11/14 1415  BP: 148/70  Pulse: 64  Temp: 36.4 C  Resp: 18    Complications: No apparent anesthesia complications

## 2014-11-11 NOTE — Op Note (Signed)
Name:  Emily Leon MRN: [id number] Date of Surgery: 11/11/2014  Preop Diagnosis:  Morbid Obesity  Postop Diagnosis:  Morbid Obesity (Weight - 313, BMI - 46.31) , S/P Gastric Sleeve  Procedure:  Upper endoscopy  (Intraoperative)  Surgeon:  Ovidio Kin, M.D.  Anesthesia:  GET  Indications for procedure: Emily Leon is a 64 y.o. female whose primary care physician is Alinda Deem, MD and has completed a Gastric Sleeve today by Dr. [name].  I am doing an intraoperative upper endoscopy to evaluate the gastric pouch.  Operative Note: The patient is under general anesthesia.  Dr. [name] is laparoscoping the patient while I do an upper endoscopy to evaluate the stomach pouch.  With the patient intubated, I passed the Pentax upper endoscope without difficulty down the esophagus.  The esophago-gastric junction was at 40 cm.    The mucosa of the stomach looked viable and the staple line was intact without bleeding.  I advanced to the pylorus, but did not go through it.  While I insufflated the stomach pouch with air, Dr. [name] flooded the upper abdomen with saline to put the gastric pouch under saline.  There was no bubbling or evidence of a leak.  There was no evidence of narrowing of the pouch and the gastric sleeve looked tubular.  The endoscope was not working to take photos.  The scope was then withdrawn.  The esophagus was unremarkable and the patient tolerated the endoscopy without difficulty.  Ovidio Kin, MD, [facility] Pager: [phone] Office phone:  [phone]

## 2014-11-11 NOTE — Progress Notes (Signed)
Spoke with patient about nocturnal CPAP use. She denies total compliance at home, but does admit to intermittent use. She does not wish to use a CPAP tonight, or during this admission. Education provided and patient encouraged to call for machine if she should decide to use it.

## 2014-11-11 NOTE — Anesthesia Procedure Notes (Signed)
Procedure Name: Intubation Date/Time: 11/11/2014 11:31 AM Performed by: Elyn Peers Pre-anesthesia Checklist: Patient identified, Emergency Drugs available, Suction available, Patient being monitored and Timeout performed Patient Re-evaluated:Patient Re-evaluated prior to inductionOxygen Delivery Method: Circle system utilized Preoxygenation: Pre-oxygenation with 100% oxygen Intubation Type: IV induction Ventilation: Mask ventilation without difficulty Laryngoscope Size: Miller and 3 Grade View: Grade I Tube type: Oral Tube size: 7.0 mm Number of attempts: 1 Airway Equipment and Method: Stylet Placement Confirmation: ETT inserted through vocal cords under direct vision,  positive ETCO2 and breath sounds checked- equal and bilateral Secured at: 20 cm Tube secured with: Tape Dental Injury: Teeth and Oropharynx as per pre-operative assessment

## 2014-11-11 NOTE — Interval H&P Note (Signed)
History and Physical Interval Note:  11/11/2014 10:55 AM  Emily Leon  has presented today for surgery, with the diagnosis of Morbid Obesity  The various methods of treatment have been discussed with the patient and family. After consideration of risks, benefits and other options for treatment, the patient has consented to  Procedure(s): LAPAROSCOPIC GASTRIC SLEEVE RESECTION (N/A) as a surgical intervention .  The patient's history has been reviewed, patient examined, no change in status, stable for surgery.  I have reviewed the patient's chart and labs.  Questions were answered to the patient's satisfaction.    Pt denies fever, chills, n/v/cp/sob/edema; denies new problems/trips to ED; sugars have been variable.  Alert, nad cta  Reg pulse  Soft, nt, nd, obese No edema  [name]. [name], MD, FACS General, Bariatric, & Minimally Invasive Surgery [facility], [place]  [facility]

## 2014-11-12 ENCOUNTER — Inpatient Hospital Stay (HOSPITAL_COMMUNITY): Payer: Medicare Other

## 2014-11-12 ENCOUNTER — Encounter (HOSPITAL_COMMUNITY): Payer: Self-pay | Admitting: General Surgery

## 2014-11-12 LAB — GLUCOSE, CAPILLARY
GLUCOSE-CAPILLARY: 216 mg/dL — AB (ref 65–99)
GLUCOSE-CAPILLARY: 255 mg/dL — AB (ref 65–99)
Glucose-Capillary: 197 mg/dL — ABNORMAL HIGH (ref 65–99)
Glucose-Capillary: 226 mg/dL — ABNORMAL HIGH (ref 65–99)
Glucose-Capillary: 241 mg/dL — ABNORMAL HIGH (ref 65–99)
Glucose-Capillary: 255 mg/dL — ABNORMAL HIGH (ref 65–99)
Glucose-Capillary: 277 mg/dL — ABNORMAL HIGH (ref 65–99)

## 2014-11-12 LAB — COMPREHENSIVE METABOLIC PANEL
ALT: 48 U/L (ref 14–54)
AST: 34 U/L (ref 15–41)
Albumin: 3.9 g/dL (ref 3.5–5.0)
Alkaline Phosphatase: 70 U/L (ref 38–126)
Anion gap: 9 (ref 5–15)
BUN: 13 mg/dL (ref 6–20)
CHLORIDE: 99 mmol/L — AB (ref 101–111)
CO2: 26 mmol/L (ref 22–32)
Calcium: 9.5 mg/dL (ref 8.9–10.3)
Creatinine, Ser: 1.06 mg/dL — ABNORMAL HIGH (ref 0.44–1.00)
GFR calc non Af Amer: 55 mL/min — ABNORMAL LOW (ref >60)
GLUCOSE: 256 mg/dL — AB (ref 65–99)
POTASSIUM: 4.5 mmol/L (ref 3.5–5.1)
SODIUM: 134 mmol/L — AB (ref 135–145)
Total Bilirubin: 0.6 mg/dL (ref 0.3–1.2)
Total Protein: 7.7 g/dL (ref 6.5–8.1)

## 2014-11-12 LAB — CBC WITH DIFFERENTIAL/PLATELET
BASOS PCT: 0 % (ref 0–1)
Basophils Absolute: 0 10*3/uL (ref 0.0–0.1)
EOS PCT: 0 % (ref 0–5)
Eosinophils Absolute: 0 10*3/uL (ref 0.0–0.7)
HCT: 40.6 % (ref 36.0–46.0)
HEMOGLOBIN: 13 g/dL (ref 12.0–15.0)
LYMPHS ABS: 1.4 10*3/uL (ref 0.7–4.0)
Lymphocytes Relative: 10 % — ABNORMAL LOW (ref 12–46)
MCH: 28.6 pg (ref 26.0–34.0)
MCHC: 32 g/dL (ref 30.0–36.0)
MCV: 89.2 fL (ref 78.0–100.0)
MONOS PCT: 6 % (ref 3–12)
Monocytes Absolute: 0.8 10*3/uL (ref 0.1–1.0)
NEUTROS ABS: 11.9 10*3/uL — AB (ref 1.7–7.7)
NEUTROS PCT: 84 % — AB (ref 43–77)
PLATELETS: 299 10*3/uL (ref 150–400)
RBC: 4.55 MIL/uL (ref 3.87–5.11)
RDW: 13.1 % (ref 11.5–15.5)
WBC: 14.2 10*3/uL — AB (ref 4.0–10.5)

## 2014-11-12 LAB — HEMOGLOBIN AND HEMATOCRIT, BLOOD
HEMATOCRIT: 38.1 % (ref 36.0–46.0)
Hemoglobin: 12.7 g/dL (ref 12.0–15.0)

## 2014-11-12 MED ORDER — UNJURY CHOCOLATE CLASSIC POWDER
2.0000 [oz_av] | Freq: Four times a day (QID) | ORAL | Status: DC
Start: 2014-11-13 — End: 2014-11-13

## 2014-11-12 MED ORDER — UNJURY CHICKEN SOUP POWDER
2.0000 [oz_av] | Freq: Four times a day (QID) | ORAL | Status: DC
Start: 2014-11-13 — End: 2014-11-13
  Administered 2014-11-13: 2 [oz_av] via ORAL

## 2014-11-12 MED ORDER — ATENOLOL 100 MG PO TABS
100.0000 mg | ORAL_TABLET | Freq: Every morning | ORAL | Status: DC
  Administered 2014-11-12 – 2014-11-13 (×2): 100 mg via ORAL
  Filled 2014-11-12 (×2): qty 1

## 2014-11-12 MED ORDER — IOHEXOL 300 MG/ML  SOLN
50.0000 mL | Freq: Once | INTRAMUSCULAR | Status: AC | PRN
  Administered 2014-11-12: 50 mL via ORAL

## 2014-11-12 MED ORDER — INSULIN GLARGINE 100 UNIT/ML ~~LOC~~ SOLN
15.0000 [IU] | Freq: Every day | SUBCUTANEOUS | Status: DC
  Administered 2014-11-12: 15 [IU] via SUBCUTANEOUS
  Filled 2014-11-12: qty 0.15

## 2014-11-12 MED ORDER — IRBESARTAN 150 MG PO TABS
150.0000 mg | ORAL_TABLET | Freq: Every day | ORAL | Status: DC
Start: 2014-11-12 — End: 2014-11-13
  Administered 2014-11-12 – 2014-11-13 (×2): 150 mg via ORAL
  Filled 2014-11-12 (×2): qty 1

## 2014-11-12 MED ORDER — UNJURY VANILLA POWDER
2.0000 [oz_av] | Freq: Four times a day (QID) | ORAL | Status: DC
  Administered 2014-11-13: 2 [oz_av] via ORAL

## 2014-11-12 MED ORDER — AMLODIPINE BESYLATE 10 MG PO TABS
10.0000 mg | ORAL_TABLET | Freq: Every morning | ORAL | Status: DC
  Administered 2014-11-12 – 2014-11-13 (×2): 10 mg via ORAL
  Filled 2014-11-12 (×2): qty 1

## 2014-11-12 MED ORDER — INSULIN ASPART 100 UNIT/ML ~~LOC~~ SOLN
0.0000 [IU] | SUBCUTANEOUS | Status: DC
  Administered 2014-11-12 (×2): 8 [IU] via SUBCUTANEOUS
  Administered 2014-11-13 (×4): 5 [IU] via SUBCUTANEOUS

## 2014-11-12 NOTE — Progress Notes (Signed)
Pt placed on nocturnal CPAP. Pt is using her home tubing and nasal mask with our machine.  Machine set at 8 cm H2O of CPAP per Pt's home regimen.  Machine plugged into red outlet and humidifier filled with sterile water.

## 2014-11-12 NOTE — Plan of Care (Signed)
Problem: Food- and Nutrition-Related Knowledge Deficit (NB-1.1) Goal: Nutrition education Formal process to instruct or train a patient/client in a skill or to impart knowledge to help patients/clients voluntarily manage or modify food choices and eating behavior to maintain or improve health. Outcome: Completed/Met Date Met:  11/12/14 Nutrition Education Note  Received consult for diet education per DROP protocol.   Discussed 2 week post op diet with pt. Emphasized that liquids must be non carbonated, non caffeinated, and sugar free. Fluid goals discussed. Pt to follow up with outpatient bariatric RD for further diet progression after 2 weeks. Multivitamins and minerals also reviewed. Teach back method used, pt expressed understanding, expect good compliance.   Diet: First 2 Weeks  You will see the nutritionist about two (2) weeks after your surgery. The nutritionist will increase the types of foods you can eat if you are handling liquids well:  If you have severe vomiting or nausea and cannot handle clear liquids lasting longer than 1 day, call your surgeon  Protein Shake  Drink at least 2 ounces of shake 5-6 times per day  Each serving of protein shakes (usually 8 - 12 ounces) should have a minimum of:  15 grams of protein  And no more than 5 grams of carbohydrate  Goal for protein each day:  Men = 80 grams per day  Women = 60 grams per day  Protein powder may be added to fluids such as non-fat milk or Lactaid milk or Soy milk (limit to 35 grams added protein powder per serving)   Hydration  Slowly increase the amount of water and other clear liquids as tolerated (See Acceptable Fluids)  Slowly increase the amount of protein shake as tolerated  Sip fluids slowly and throughout the day  May use sugar substitutes in small amounts (no more than 6 - 8 packets per day; i.e. Splenda)   Fluid Goal  The first goal is to drink at least 8 ounces of protein shake/drink per day (or as directed  by the nutritionist); some examples of protein shakes are Syntrax Nectar, Adkins Advantage, EAS Edge HP, and Unjury. See handout from pre-op Bariatric Education Class:  Slowly increase the amount of protein shake you drink as tolerated  You may find it easier to slowly sip shakes throughout the day  It is important to get your proteins in first  Your fluid goal is to drink 64 - 100 ounces of fluid daily  It may take a few weeks to build up to this  32 oz (or more) should be clear liquids  And  32 oz (or more) should be full liquids (see below for examples)  Liquids should not contain sugar, caffeine, or carbonation   Clear Liquids:  Water or Sugar-free flavored water (i.e. Fruit H2O, Propel)  Decaffeinated coffee or tea (sugar-free)  Crystal Lite, Wyler's Lite, Minute Maid Lite  Sugar-free Jell-O  Bouillon or broth  Sugar-free Popsicle: *Less than 20 calories each; Limit 1 per day   Full Liquids:  Protein Shakes/Drinks + 2 choices per day of other full liquids  Full liquids must be:  No More Than 12 grams of Carbs per serving  No More Than 3 grams of Fat per serving  Strained low-fat cream soup  Non-Fat milk  Fat-free Lactaid Milk  Sugar-free yogurt (Dannon Lite & Fit, Greek yogurt)     Dennise Bamber, MS, RD, LDN Pager: [phone] After Hours Pager: [phone]        

## 2014-11-12 NOTE — Progress Notes (Signed)
1 Day Post-Op  Subjective: Pt refused cpap. Had nausea overnight. Doing IS. Ambulated. BP elevated and BS elevated  Objective: Vital signs in last 24 hours: Temp:  [97.5 F (36.4 C)-98.9 F (37.2 C)] 98.9 F (37.2 C) (07/27 0600) Pulse Rate:  [62-92] 89 (07/27 0600) Resp:  [11-24] 18 (07/27 0600) BP: (128-181)/(52-79) 181/79 mmHg (07/27 0600) SpO2:  [92 %-99 %] 97 % (07/27 0600) Weight:  [138.971 kg (306 lb 6 oz)] 138.971 kg (306 lb 6 oz) (07/26 1001)    Intake/Output from previous day: 07/26 0701 - 07/27 0700 In: 2481.3 [I.V.:2481.3] Out: 1924 [Urine:1924] Intake/Output this shift:    Alert, nontoxic cta b/l Reg Soft, obese, incision c/d/i; expected right sided soreness No edema  Lab Results:   Recent Labs  11/11/14 1010 11/11/14 1348 11/12/14 0605  WBC 7.3  --  14.2*  HGB 12.7 12.2 13.0  HCT 39.3 37.0 40.6  PLT 273  --  299   BMET  Recent Labs  11/11/14 1010 11/12/14 0605  NA 138 134*  K 4.1 4.5  CL 105 99*  CO2 25 26  GLUCOSE 252* 256*  BUN 20 13  CREATININE 1.03* 1.06*  CALCIUM 9.5 9.5   PT/INR No results for input(s): LABPROT, INR in the last 72 hours. ABG No results for input(s): PHART, HCO3 in the last 72 hours.  Invalid input(s): PCO2, PO2  Studies/Results: No results found.  Anti-infectives: Anti-infectives    Start     Dose/Rate Route Frequency Ordered Stop   11/11/14 0600  levofloxacin (LEVAQUIN) IVPB 750 mg     750 mg 100 mL/hr over 90 Minutes Intravenous On call to O.R. 11/10/14 1307 11/11/14 1226      Assessment/Plan: s/p Procedure(s): LAPAROSCOPIC GASTRIC SLEEVE RESECTION (N/A) UPPER GI ENDOSCOPY  Hyperglycemia - cont SSI, lantus. Will await diabetic educator recs HTN - restart home meds after swallow GI- for UGI this am, if ok will start POD 1 diet pulm toilet Discussed importance of CPAP usage Antiemetics Cont chemical VTE prophy  [name]. [name], MD, FACS General, Bariatric, & Minimally Invasive  Surgery [facility], [place]   LOS: 1 day    [name] 11/12/2014

## 2014-11-12 NOTE — Progress Notes (Signed)
Inpatient Diabetes Program Recommendations  AACE/ADA: New Consensus Statement on Inpatient Glycemic Control (2013)  Target Ranges:  Prepandial:   less than 140 mg/dL      Peak postprandial:   less than 180 mg/dL (1-2 hours)      Critically ill patients:  140 - 180 mg/dL   Results for Emily Leon, Emily Leon (MRN [id number]) as of 11/12/2014 11:13  Ref. Range 11/11/2014 09:19 11/11/2014 12:04 11/11/2014 13:36 11/11/2014 14:36 11/11/2014 16:19 11/11/2014 20:14  Glucose-Capillary Latest Ref Range: 65-99 mg/dL 409 (H) 811 (H) 914 (H) 235 (H) 280 (H) 315 (H)    Results for Emily Leon, Emily Leon (MRN [id number]) as of 11/12/2014 11:13  Ref. Range 11/11/2014 23:56 11/12/2014 03:51 11/12/2014 07:45  Glucose-Capillary Latest Ref Range: 65-99 mg/dL 086 (H) 578 (H) 469 (H)    Home DM Meds: U-500 insulin- 15 units tidwc  Current DM Orders: Lantus 5 units QHS            Novolog Resistant SSI (0-20 units) Q4 hours    -Patient normally takes Regular U-500 insulin at home.  U-500 insulin is 5 times as concentrated as Regular U-100 insulin.  Her home doses would be approximately equivalent to 75 units U-100 insulin tid with meals (although U-500 insulin does NOT have a similar profile to U-100 insulin since it is so concentrated).  -Note patient received 5 units Lantus last PM and is currently getting Novolog Resistant SSI Q4 hours.  -CBGs >200 mg/dl consistently.  -Called patient's Endocrinologist Dr. Fransico Him in [place] [phone]) to discuss patient's insulin needs.  Reviewed patient's CBGs and insulin requirements over the last 24 hours with Dr. Fransico Him.  Dr. Fransico Him recommended that we increase Lantus to 15 units QHS (start tonight) and decrease Novolog SSI to Moderate scale (0-15 units).  If patient is eating a fair amount of carbohydrate, Dr. Fransico Him suggested Novolog 5 units tid with meals. However, per the post-op bariatric diet guidelines, patient will only get 2 ounces of High  Protein shakes QID on POD #2 so Novolog Meal Coverage will not likely be necessary yet.  -Spoke with Zola Button, PA this afternoon (covering for Dr. [name]) to discuss Dr. Isidoro Donning recommendations.  PA agreeable with insulin recommendations from Dr. Fransico Him (increase Lantus to 15 units QHS and decrease Novolog SSI to Moderate scale).  Updated insulin orders placed into EPIC and RN on unit 5W alerted to new orders.  -Dr. Fransico Him in [place] did ask that I (DM Coordinator) call him back tomorrow AM to further review patient's CBGs and insulin requirements.    Will follow Ambrose Finland RN, MSN, CDE Diabetes Coordinator Inpatient Glycemic Control Team Team Pager: [phone] (8a-5p)

## 2014-11-12 NOTE — Progress Notes (Signed)
Rm 1539, Emily Leon, Gastric Sleeve.  Has BP 180/89 P85, this am.  At home she's on diovan,atenolol,etc.  Thanks.

## 2014-11-13 DIAGNOSIS — Z794 Long term (current) use of insulin: Secondary | ICD-10-CM

## 2014-11-13 DIAGNOSIS — G4733 Obstructive sleep apnea (adult) (pediatric): Secondary | ICD-10-CM

## 2014-11-13 DIAGNOSIS — K760 Fatty (change of) liver, not elsewhere classified: Secondary | ICD-10-CM | POA: Diagnosis present

## 2014-11-13 DIAGNOSIS — I10 Essential (primary) hypertension: Secondary | ICD-10-CM

## 2014-11-13 DIAGNOSIS — E119 Type 2 diabetes mellitus without complications: Secondary | ICD-10-CM

## 2014-11-13 DIAGNOSIS — IMO0001 Reserved for inherently not codable concepts without codable children: Secondary | ICD-10-CM

## 2014-11-13 DIAGNOSIS — Z9989 Dependence on other enabling machines and devices: Secondary | ICD-10-CM

## 2014-11-13 HISTORY — DX: Essential (primary) hypertension: I10

## 2014-11-13 HISTORY — DX: Fatty (change of) liver, not elsewhere classified: K76.0

## 2014-11-13 HISTORY — DX: Obstructive sleep apnea (adult) (pediatric): G47.33

## 2014-11-13 LAB — CBC WITH DIFFERENTIAL/PLATELET
Basophils Absolute: 0 10*3/uL (ref 0.0–0.1)
Basophils Relative: 0 % (ref 0–1)
EOS ABS: 0 10*3/uL (ref 0.0–0.7)
Eosinophils Relative: 0 % (ref 0–5)
HCT: 41.3 % (ref 36.0–46.0)
Hemoglobin: 13.4 g/dL (ref 12.0–15.0)
Lymphocytes Relative: 19 % (ref 12–46)
Lymphs Abs: 2.2 10*3/uL (ref 0.7–4.0)
MCH: 29.3 pg (ref 26.0–34.0)
MCHC: 32.4 g/dL (ref 30.0–36.0)
MCV: 90.2 fL (ref 78.0–100.0)
MONO ABS: 0.8 10*3/uL (ref 0.1–1.0)
MONOS PCT: 7 % (ref 3–12)
NEUTROS ABS: 8.9 10*3/uL — AB (ref 1.7–7.7)
NEUTROS PCT: 74 % (ref 43–77)
Platelets: 317 10*3/uL (ref 150–400)
RBC: 4.58 MIL/uL (ref 3.87–5.11)
RDW: 13.1 % (ref 11.5–15.5)
WBC: 11.9 10*3/uL — ABNORMAL HIGH (ref 4.0–10.5)

## 2014-11-13 LAB — HEMOGLOBIN A1C
HEMOGLOBIN A1C: 9.7 % — AB (ref 4.8–5.6)
Mean Plasma Glucose: 232 mg/dL

## 2014-11-13 LAB — GLUCOSE, CAPILLARY
Glucose-Capillary: 228 mg/dL — ABNORMAL HIGH (ref 65–99)
Glucose-Capillary: 206 mg/dL — ABNORMAL HIGH (ref 65–99)
Glucose-Capillary: 220 mg/dL — ABNORMAL HIGH (ref 65–99)

## 2014-11-13 MED ORDER — ONDANSETRON 4 MG PO TBDP: 4.0000 mg | ORAL_TABLET | Freq: Three times a day (TID) | ORAL | Status: DC | PRN

## 2014-11-13 MED ORDER — PANTOPRAZOLE SODIUM 40 MG PO TBEC: 40.0000 mg | DELAYED_RELEASE_TABLET | Freq: Every day | ORAL | Status: DC

## 2014-11-13 MED ORDER — INSULIN GLARGINE 100 UNIT/ML ~~LOC~~ SOLN
10.0000 [IU] | Freq: Once | SUBCUTANEOUS | Status: AC
  Administered 2014-11-13: 10 [IU] via SUBCUTANEOUS
  Filled 2014-11-13: qty 0.1

## 2014-11-13 MED ORDER — INSULIN GLARGINE 100 UNIT/ML ~~LOC~~ SOLN
25.0000 [IU] | Freq: Every day | SUBCUTANEOUS | Status: DC
  Filled 2014-11-13: qty 0.25

## 2014-11-13 MED ORDER — INSULIN ASPART 100 UNIT/ML ~~LOC~~ SOLN: 0.0000 [IU] | SUBCUTANEOUS | Status: DC

## 2014-11-13 MED ORDER — CHLORHEXIDINE GLUCONATE 0.12 % MT SOLN
OROMUCOSAL | Status: AC
  Filled 2014-11-13: qty 15

## 2014-11-13 MED ORDER — PHENOL 1.4 % MT LIQD
1.0000 | OROMUCOSAL | Status: DC | PRN
  Administered 2014-11-13: 1 via OROMUCOSAL
  Filled 2014-11-13: qty 177

## 2014-11-13 MED ORDER — INSULIN GLARGINE 100 UNIT/ML ~~LOC~~ SOLN: 25.0000 [IU] | Freq: Every day | SUBCUTANEOUS | Status: DC

## 2014-11-13 MED ORDER — CHLORHEXIDINE GLUCONATE 0.12 % MT SOLN
15.0000 mL | Freq: Four times a day (QID) | OROMUCOSAL | Status: DC
  Administered 2014-11-13 (×3): 15 mL via OROMUCOSAL
  Filled 2014-11-13 (×2): qty 15

## 2014-11-13 MED ORDER — OXYCODONE HCL 5 MG/5ML PO SOLN: 5.0000 mg | ORAL | Status: DC | PRN

## 2014-11-13 NOTE — Progress Notes (Signed)
Patient alert and oriented, pain is controlled. Patient is tolerating fluids,  advanced to protein shake today, patient tolerating well.  Reviewed Gastric sleeve discharge instructions with patient and patient is able to articulate understanding.  Provided information on BELT program, Support Group and [facility] outpatient pharmacy. All questions answered, will continue to monitor.  

## 2014-11-13 NOTE — Discharge Instructions (Addendum)
Here is your sliding scale regimen for Novolog  For blood sugars: 0 to 120           give 0 units 121 to 150       Give 3 units 151 to 200       Give 4 units 201 to 250       Give 7 units 251 to 300       Give  11 units 301 to 350       Give 15 units 351 to 400       Give 20 units >400                Call Doctor     GASTRIC BYPASS/SLEEVE  Home Care Instructions   These instructions are to help you care for yourself when you go home.  Call: If you have any problems.  Call [phone] and ask for the surgeon on call  If you need immediate assistance come to the ER at [facility]. Tell the ER staff you are a new post-op gastric bypass or gastric sleeve patient  Signs and symptoms to report:  Severe  vomiting or nausea o If you cannot handle clear liquids for longer than 1 day, call your surgeon  Abdominal pain which does not get better after taking your pain medication  Fever greater than 100.4  F and chills  Heart rate over 100 beats a minute  Trouble breathing  Chest pain  Redness,  swelling, drainage, or foul odor at incision (surgical) sites  If your incisions open or pull apart  Swelling or pain in calf (lower leg)  Diarrhea (Loose bowel movements that happen often), frequent watery, uncontrolled bowel movements  Constipation, (no bowel movements for 3 days) if this happens: o Take Milk of Magnesia, 2 tablespoons by mouth, 3 times a day for 2 days if needed o Stop taking Milk of Magnesia once you have had a bowel movement o Call your doctor if constipation continues Or o Take Miralax  (instead of Milk of Magnesia) following the label instructions o Stop taking Miralax once you have had a bowel movement o Call your doctor if constipation continues  Anything you think is abnormal for you   Normal side effects after surgery:  Unable to sleep at night or unable to concentrate  Irritability  Being tearful (crying) or depressed  These are common  complaints, possibly related to your anesthesia, stress of surgery, and change in lifestyle, that usually go away a few weeks after surgery. If these feelings continue, call your medical doctor.  Wound Care: You may have surgical glue, steri-strips, or staples over your incisions after surgery  Surgical glue: Looks like clear film over your incisions and will wear off a little at a time  Steri-strips: Adhesive strips of tape over your incisions. You may notice a yellowish color on skin under the steri-strips. This is used to make the steri-strips stick better. Do not pull the steri-strips off - let them fall off  Staples: Staples may be removed before you leave the hospital o If you go home with staples, call [facility] for an appointment with your surgeons nurse to have staples removed 10 days after surgery, [phone]  Showering: You may shower two (2) days after your surgery unless your surgeon tells you differently o Wash gently around incisions with warm soapy water, rinse well, and gently pat dry o If you have a drain (tube from your incision), you may  need someone to hold this while you shower o No tub baths until staples are removed and incisions are healed   Medications:  Medications should be liquid or crushed if larger than the size of a dime  Extended release pills (medication that releases a little bit at a time through the  day) should not be crushed  Depending on the size and number of medications you take, you may need to space (take a few throughout the day)/change the time you take your medications so that you do not over-fill your pouch (smaller stomach)  Make sure you follow-up with you primary care physician to make medication changes needed during rapid weight loss and life -style changes  If you have diabetes, follow up with your doctor that orders your diabetes medication(s) within one week after surgery and check your blood sugar regularly   Do  not drive while taking narcotics (pain medications)   Do not take acetaminophen (Tylenol) and Roxicet or Lortab Elixir at the same time since these pain medications contain acetaminophen   Diet:  First 2 Weeks You will see the nutritionist about two (2) weeks after your surgery. The nutritionist will increase the types of foods you can eat if you are handling liquids well:  If you have severe vomiting or nausea and cannot handle clear liquids lasting longer than 1 day call your surgeon Protein Shake  Drink at least 2 ounces of shake 5-6 times per day  Each serving of protein shakes (usually 8-12 ounces) should have a minimum of: o 15 grams of protein o And no more than 5 grams of carbohydrate  Goal for protein each day: o Men = 80 grams per day o Women = 60 grams per day     Protein powder may be added to fluids such as non-fat milk or Lactaid milk or Soy milk (limit to 35 grams added protein powder per serving)  Hydration  Slowly increase the amount of water and other clear liquids as tolerated (See Acceptable Fluids)  Slowly increase the amount of protein shake as tolerated  Sip fluids slowly and throughout the day  May use sugar substitutes in small amounts (no more than 6-8 packets per day; i.e. Splenda)  Fluid Goal  The first goal is to drink at least 8 ounces of protein shake/drink per day (or as directed by the nutritionist); some examples of protein shakes are [employer], [employer], EAS Edge HP, and Unjury. - See handout from pre-op Bariatric Education Class: o Slowly increase the amount of protein shake you drink as tolerated o You may find it easier to slowly sip shakes throughout the day o It is important to get your proteins in first  Your fluid goal is to drink 64-100 ounces of fluid daily o It may take a few weeks to build up to this   32 oz. (or more) should be clear liquids And  32 oz. (or more) should be full liquids (see below for  examples)  Liquids should not contain sugar, caffeine, or carbonation  Clear Liquids:  Water of Sugar-free flavored water (i.e. Fruit HO, Propel)  Decaffeinated coffee or tea (sugar-free)  Crystal lite, Wylers Lite, Minute Maid Lite  Sugar-free Jell-O  Bouillon or broth  Sugar-free Popsicle:    - Less than 20 calories each; Limit 1 per day  Full Liquids:                   Protein Shakes/Drinks + 2 choices per day  of other full liquids  Full liquids must be: o No More Than 12 grams of Carbs per serving o No More Than 3 grams of Fat per serving  Strained low-fat cream soup  Non-Fat milk  Fat-free Lactaid Milk  Sugar-free yogurt (Dannon Lite & Fit, Greek yogurt)    Vitamins and Minerals  Start 1 day after surgery unless otherwise directed by your surgeon  2 Chewable Multivitamin / Multimineral Supplement with iron (i.e. Centrum for Adults)  Vitamin B-12, 350-500 micrograms sub-lingual (place tablet under the tongue) each day  Chewable Calcium Citrate with Vitamin D-3 (Example: 3 Chewable Calcium  Plus 600 with Vitamin D-3) o Take 500 mg three (3) times a day for a total of 1500 mg each day o Do not take all 3 doses of calcium at one time as it may cause constipation, and you can only absorb 500 mg at a time o Do not mix multivitamins containing iron with calcium supplements;  take 2 hours apart o Do not substitute Tums (calcium carbonate) for your calcium  Menstruating women and those at risk for anemia ( a blood disease that causes weakness) may need extra iron o Talk to your doctor to see if you need more iron  If you need extra iron: Total daily Iron recommendation (including Vitamins) is 50 to 100 mg Iron/day  Do not stop taking or change any vitamins or minerals until you talk to your nutritionist or surgeon  Your nutritionist and/or surgeon must approve all vitamin and mineral supplements   Activity and Exercise: It is important to continue walking at  home. Limit your physical activity as instructed by your doctor. During this time, use these guidelines:  Do not lift anything greater than ten  (10) pounds for at least two (2) weeks  Do not go back to work or drive until Designer, industrial/product says you can  You may have sex when you feel comfortable o It is VERY important for female patients to use a reliable birth control method; fertility often increase after surgery o Do not get pregnant for at least 18 months  Start exercising as soon as your doctor tells you that you can o Make sure your doctor approves any physical activity  Start with a simple walking program  Walk 5-15 minutes each day, 7 days per week  Slowly increase until you are walking 30-45 minutes per day  Consider joining our BELT program. [phone] or email [email or web address]   Special Instructions Things to remember:  Free counseling is available for you and your family through collaboration between [facility] and [facility]. Please call [phone] and leave a message  Use your CPAP when sleeping if this applies to you  Consider buying a medical alert bracelet that says you had lap-band surgery     You will likely have your first fill (fluid added to your band) 6 - 8 weeks after surgery  [facility] has a free Bariatric Surgery Support Group that meets monthly, the 3rd Thursday, 6pm. Calvert Cantor. You can see classes online at [email or web address]  It is very important to keep all follow up appointments with your surgeon, nutritionist, primary care physician, and behavioral health practitioner o After the first year, please follow up with your bariatric surgeon and nutritionist at least once a year in order to maintain best weight loss results                    [facility]:  [phone]               [facility]: [phone]               Bariatric Nurse Coordinator: [phone]  Gastric Bypass/Sleeve Home Care Instructions  Rev. 05/2012                                                         Reviewed and Endorsed                                                    by [facility] Patient Education Committee, Jan, 2014

## 2014-11-13 NOTE — Discharge Summary (Signed)
Reviewed d/c instructions with patient and family including follow-up appointments, medications (including new insulin orders), precautions, and incision care.  Answered all questions and pt/family verbalized good understanding.  Pt being d/c to home into care of family.

## 2014-11-13 NOTE — Plan of Care (Signed)
Problem: Phase II Progression Outcomes Goal: Pain controlled on oral analgesia Outcome: Progressing Pt not needing pain meds today.

## 2014-11-13 NOTE — Discharge Summary (Signed)
Physician Discharge Summary  Emily Leon ZOX:[id number] DOB: [birth year] DOA: 11/11/2014  PCP: Alinda Deem, MD  Admit date: 11/11/2014 Discharge date: 11/13/2014  Recommendations for Outpatient Follow-up:   Follow-up Information    Follow up with [name], MD. Go on 12/03/2014.   Specialty:  General Surgery   Why:  For Post-Op Check AT 10:15 am   Contact information:   [address] [place] [address] [phone]       Follow up with NIDA,GEBRESELASSIE, MD. Schedule an appointment as soon as possible for a visit in 2 weeks.   Specialty:  Endocrinology   Contact information:   [address] [place] [address] [phone]      Discharge Diagnoses:  Active Problems:   S/P laparoscopic sleeve gastrectomy Principal Problem:   Obesity, Class III, BMI 40-49.9 (morbid obesity) Active Problems:   S/P laparoscopic sleeve gastrectomy   Insulin dependent diabetes mellitus   Essential hypertension   OSA on CPAP   Fatty liver disease, nonalcoholic   Surgical Procedure: Laparoscopic Sleeve Gastrectomy, upper endoscopy  Discharge Condition: Good Disposition: Home  Diet recommendation: Postoperative sleeve gastrectomy diet (liquids only)  Filed Weights   11/11/14 0916 11/11/14 1001 11/12/14 1832  Weight: 138.971 kg (306 lb 6 oz) 138.971 kg (306 lb 6 oz) 140.025 kg (308 lb 11.2 oz)     Hospital Course:  The patient was admitted for a planned laparoscopic sleeve gastrectomy. Please see operative note. Preoperatively the patient was given 5000 units of subcutaneous heparin for DVT prophylaxis. Her fasting glucose on the morning of surgery was 269.  Postoperative prophylactic Lovenox dosing was started on the morning of postoperative day 1.She was maintained on sliding scale insulin and nightly lantus. The patient underwent an upper GI on postoperative day 1 which demonstrated no extravasation of contrast and emptying of the contrast into the small  bowel. The patient was started on ice chips and water which they tolerated. The diabetes educator rounded on the patient and discussed her insulin regimen with her endocrinologist on each day. On postoperative day 2 The patient's diet was advanced to protein shakes which they also tolerated. The patient was ambulating without difficulty. Their vital signs are stable without fever or tachycardia. Their hemoglobin had remained stable. The patient was maintained on their home settings for CPAP therapy. The patient had received discharge instructions and counseling. She was taking adequate amounts of liquids by mouth. Her endocrinologist was sending in Rx to the pt's pharmacy for postdischarge blood glucose management. They were deemed stable for discharge.   Discharge Instructions  Discharge Instructions    Ambulate hourly while awake    Complete by:  As directed      Call MD for:  difficulty breathing, headache or visual disturbances    Complete by:  As directed      Call MD for:  persistant dizziness or light-headedness    Complete by:  As directed      Call MD for:  persistant nausea and vomiting    Complete by:  As directed      Call MD for:  redness, tenderness, or signs of infection (pain, swelling, redness, odor or green/yellow discharge around incision site)    Complete by:  As directed      Call MD for:  severe uncontrolled pain    Complete by:  As directed      Call MD for:  temperature >101 F    Complete by:  As directed  Diet bariatric full liquid    Complete by:  As directed      Discharge instructions    Complete by:  As directed   Your endocrinologist sent in your prescriptions for new insulin medications - please pick up See bariatric surgery discharge instructions     Incentive spirometry    Complete by:  As directed   Perform hourly while awake            Medication List    STOP taking these medications        aspirin 81 MG tablet     HUMULIN R 500 UNIT/ML  injection  Generic drug:  insulin regular human CONCENTRATED      TAKE these medications        amLODipine 10 MG tablet  Commonly known as:  NORVASC  Take 10 mg by mouth every morning.  Notes to Patient:  Monitor Blood Pressure Daily and keep a log for primary care physician.  You may need to make changes to your medications with rapid weight loss.        atenolol 100 MG tablet  Commonly known as:  TENORMIN  Take 100 mg by mouth every morning.  Notes to Patient:  Monitor Blood Pressure Daily and keep a log for primary care physician.  You may need to make changes to your medications with rapid weight loss.        ergocalciferol 50000 UNITS capsule  Commonly known as:  VITAMIN D2  Take 50,000 Units by mouth every Monday.     ezetimibe-simvastatin 10-40 MG per tablet  Commonly known as:  VYTORIN  Take 1 tablet by mouth at bedtime.     hydrochlorothiazide 25 MG tablet  Commonly known as:  HYDRODIURIL  Take 12.5 mg by mouth every morning.  Notes to Patient:  Monitor Blood Pressure Daily and keep a log for primary care physician.  Monitor for symptoms of dehydration.  You may need to make changes to your medications with rapid weight loss.        insulin aspart 100 UNIT/ML injection  Commonly known as:  novoLOG  Inject 0-20 Units into the skin every 4 (four) hours.     insulin glargine 100 UNIT/ML injection  Commonly known as:  LANTUS  Inject 0.25 mLs (25 Units total) into the skin at bedtime.     loratadine 10 MG tablet  Commonly known as:  CLARITIN  Take 10 mg by mouth daily.     ondansetron 4 MG disintegrating tablet  Commonly known as:  ZOFRAN ODT  Take 1 tablet (4 mg total) by mouth every 8 (eight) hours as needed for nausea or vomiting.     oxyCODONE 5 MG/5ML solution  Commonly known as:  ROXICODONE  Take 5-10 mLs (5-10 mg total) by mouth every 4 (four) hours as needed for moderate pain or severe pain.     pantoprazole 40 MG tablet  Commonly known as:  PROTONIX   Take 1 tablet (40 mg total) by mouth daily.     valsartan 160 MG tablet  Commonly known as:  DIOVAN  Take 160 mg by mouth every morning.  Notes to Patient:  Monitor Blood Pressure Daily and keep a log for primary care physician.  You may need to make changes to your medications with rapid weight loss.              Follow-up Information    Follow up with [name], MD. Go on 12/03/2014.   Specialty:  General Surgery   Why:  For Post-Op Check AT 10:15 am   Contact information:   [address] [place] [address] [phone]       Follow up with NIDA,GEBRESELASSIE, MD. Schedule an appointment as soon as possible for a visit in 2 weeks.   Specialty:  Endocrinology   Contact information:   [address] [place] [address] [phone]        The results of significant diagnostics from this hospitalization (including imaging, microbiology, ancillary and laboratory) are listed below for reference.    Significant Diagnostic Studies: Dg Ugi W/water Sol Cm  11/12/2014   CLINICAL DATA:  64 year old female status post sleeve gastrectomy.  EXAM: WATER SOLUBLE UPPER GI SERIES  TECHNIQUE: Single-column upper GI series was performed using water soluble contrast.  CONTRAST:  50 mL of Omnipaque.  COMPARISON:  12/19/2013.  FLUOROSCOPY TIME:  If the device does not provide the exposure index:  Fluoroscopy Time (in minutes and seconds):  2 minutes and 21 seconds  Number of Acquired Images:  25  FINDINGS: Initial KUB demonstrated a suture line in the epigastric region. Subsequently, following ingestion of water soluble oral contrast the gastric remnant was opacified. Marked narrowing of the lumen was identified, most significant in the body and proximal antrum, suggesting some degree of postoperative edema. However, this was nonobstructive. Contrast readily traversed the pylorus into the proximal small bowel. No extravasation of contrast was identified at any point during the  examination.  IMPRESSION: 1. Expected postoperative appearance of the stomach following sleeve gastrectomy, with evidence of postoperative edema predominantly in the body and proximal antrum of the stomach. No signs of extravasation or obstruction at this time.   Electronically Signed   By: Trudie Reed M.D.   On: 11/12/2014 12:39    Labs: Basic Metabolic Panel:  Recent Labs Lab 11/11/14 1010 11/12/14 0605  NA 138 134*  K 4.1 4.5  CL 105 99*  CO2 25 26  GLUCOSE 252* 256*  BUN 20 13  CREATININE 1.03* 1.06*  CALCIUM 9.5 9.5   Liver Function Tests:  Recent Labs Lab 11/11/14 1010 11/12/14 0605  AST 26 34  ALT 42 48  ALKPHOS 61 70  BILITOT 0.6 0.6  PROT 6.9 7.7  ALBUMIN 3.5 3.9    CBC:  Recent Labs Lab 11/11/14 1010 11/11/14 1348 11/12/14 0605 11/12/14 1605 11/13/14 0535  WBC 7.3  --  14.2*  --  11.9*  NEUTROABS 4.4  --  11.9*  --  8.9*  HGB 12.7 12.2 13.0 12.7 13.4  HCT 39.3 37.0 40.6 38.1 41.3  MCV 90.6  --  89.2  --  90.2  PLT 273  --  299  --  317    CBG:  Recent Labs Lab 11/12/14 1917 11/12/14 2343 11/13/14 0502 11/13/14 0751 11/13/14 0805  GLUCAP 255* 241* 228* 220* 206*    Active Problems:   S/P laparoscopic sleeve gastrectomy   Time coordinating discharge: 20 minutes  Signed:  [name], MD [facility], [place] [phone] 11/13/2014, 9:45 PM

## 2014-11-13 NOTE — Progress Notes (Addendum)
Inpatient Diabetes Program Recommendations  AACE/ADA: New Consensus Statement on Inpatient Glycemic Control (2013)  Target Ranges:  Prepandial:   less than 140 mg/dL      Peak postprandial:   less than 180 mg/dL (1-2 hours)      Critically ill patients:  140 - 180 mg/dL   Results for Emily Leon, Emily Leon (MRN [id number]) as of 11/13/2014 11:52  Ref. Range 11/11/2014 23:56 11/12/2014 03:51 11/12/2014 07:45 11/12/2014 12:16 11/12/2014 16:04 11/12/2014 19:17  Glucose-Capillary Latest Ref Range: 65-99 mg/dL 086 (H) 578 (H) 469 (H) 197 (H) 255 (H) 255 (H)    Home DM Meds: U-500 insulin- 15 units tidwc  Current DM Orders: Lantus 15 units QHS  Novolog Moderate SSI (0-15 units) Q4 hours     -Called Dr. Fransico Him (patient's Endocrinologist) to review CBGs and insulin requirements.    -Dr. Fransico Him requested the following in-hospital insulin adjustments:    1. Give an extra 10 units Lantus this AM now X one dose  2. Increase Lantus to 25 units QHS  3. Increase Novolog SSI to Resistant scale (0-20 units) Q4 hours   -Dr. Fransico Him would like for patient to be d/c'd home on Lantus 25 units QHS and Novolog Resistant SSI until patient can follow-up with him in 2 weeks.  Dr. Fransico Him asked me to have patient call his office to make an appointment to see him on 11/24/14.  Dr. Fransico Him also told me he will call patient's pharmacy to get patient Rxs for both Lantus and Novolog.  -Will speak with patient about the above information today.  -Spoke with Dr. [name] by phone.  Dr. [name] stated he would like Dr. Isidoro Donning recommendations implemented today.  Orders placed and RN alerted to new orders.  1415 Addendum: Spoke with patient about my conversation with Dr. Fransico Him and Dr. [name].  Told patient Dr. Fransico Him would like to send her home on Lantus and Novolog (stop U-500 insulin for now) and have her follow-up with him (Dr. Fransico Him) on 11/24/14.  Instructed patient to call Dr. Isidoro Donning office after d/c to make  an appt.  Also told patient Dr. Fransico Him stated he would send electronic Rxs to her pharmacy for Lantus and Novolog.  Instructed patient to pick up these Rxs today after d/c if she goes home.  Patient stated she has taken Lantus and Novolog before and is familiar with them both.  Instructed patient to call Dr. Isidoro Donning office if there is a problem picking up her Lantus and Novolog after d/c.    Will follow Ambrose Finland RN, MSN, CDE Diabetes Coordinator Inpatient Glycemic Control Team Team Pager: [phone] (8a-5p)

## 2014-11-13 NOTE — Progress Notes (Signed)
2 Days Post-Op  Subjective: Had nausea/having nausea. Some pain at extraction site. Tolerated water. States it takes a few minutes to get down about 2 oz of liquid. Ambulating. Started back on BP meds except for diuretic yesterday.   Objective: Vital signs in last 24 hours: Temp:  [98.4 F (36.9 C)-99.1 F (37.3 C)] 98.4 F (36.9 C) (07/28 0600) Pulse Rate:  [67-82] 67 (07/28 0600) Resp:  [18-20] 18 (07/28 0600) BP: (165-188)/(71-85) 165/80 mmHg (07/28 0600) SpO2:  [91 %-98 %] 91 % (07/28 0600) Weight:  [140.025 kg (308 lb 11.2 oz)] 140.025 kg (308 lb 11.2 oz) (07/27 1832)    Intake/Output from previous day: 07/27 0701 - 07/28 0700 In: 3240 [P.O.:240; I.V.:3000] Out: 2400 [Urine:2400] Intake/Output this shift: Total I/O In: -  Out: 250 [Urine:250]  Alert, nad, looks good cta b/l  Reg Soft, obese, incisions c/d/i; approp tenderness at extraction site No edema   Lab Results:   Recent Labs  11/12/14 0605 11/12/14 1605 11/13/14 0535  WBC 14.2*  --  11.9*  HGB 13.0 12.7 13.4  HCT 40.6 38.1 41.3  PLT 299  --  317   BMET  Recent Labs  11/11/14 1010 11/12/14 0605  NA 138 134*  K 4.1 4.5  CL 105 99*  CO2 25 26  GLUCOSE 252* 256*  BUN 20 13  CREATININE 1.03* 1.06*  CALCIUM 9.5 9.5   PT/INR No results for input(s): LABPROT, INR in the last 72 hours. ABG No results for input(s): PHART, HCO3 in the last 72 hours.  Invalid input(s): PCO2, PO2  Studies/Results: Dg Ugi W/water Sol Cm  11/12/2014   CLINICAL DATA:  64 year old female status post sleeve gastrectomy.  EXAM: WATER SOLUBLE UPPER GI SERIES  TECHNIQUE: Single-column upper GI series was performed using water soluble contrast.  CONTRAST:  50 mL of Omnipaque.  COMPARISON:  12/19/2013.  FLUOROSCOPY TIME:  If the device does not provide the exposure index:  Fluoroscopy Time (in minutes and seconds):  2 minutes and 21 seconds  Number of Acquired Images:  25  FINDINGS: Initial KUB demonstrated a suture line in  the epigastric region. Subsequently, following ingestion of water soluble oral contrast the gastric remnant was opacified. Marked narrowing of the lumen was identified, most significant in the body and proximal antrum, suggesting some degree of postoperative edema. However, this was nonobstructive. Contrast readily traversed the pylorus into the proximal small bowel. No extravasation of contrast was identified at any point during the examination.  IMPRESSION: 1. Expected postoperative appearance of the stomach following sleeve gastrectomy, with evidence of postoperative edema predominantly in the body and proximal antrum of the stomach. No signs of extravasation or obstruction at this time.   Electronically Signed   By: Trudie Reed M.D.   On: 11/12/2014 12:39    Anti-infectives: Anti-infectives    Start     Dose/Rate Route Frequency Ordered Stop   11/11/14 0600  levofloxacin (LEVAQUIN) IVPB 750 mg     750 mg 100 mL/hr over 90 Minutes Intravenous On call to O.R. 11/10/14 1307 11/11/14 1226      Assessment/Plan: s/p Procedure(s): LAPAROSCOPIC GASTRIC SLEEVE RESECTION (N/A) UPPER GI ENDOSCOPY HTN IDDM OSA on CPAP  BP getting better.  Really appreciate DM educator assistance with diabetes control. Will ask Diabetic educator to assist with dc planning. i'm ok with sending her home with her preop insulin type if that is what her endocrinologist prefers.  Nausea expected - will check on PO status later today and make  final determination about discharge.   Emily Leon. [name], MD, FACS General, Bariatric, & Minimally Invasive Surgery [facility], [place]   LOS: 2 days    Emily Leon 11/13/2014

## 2014-11-19 ENCOUNTER — Telehealth (HOSPITAL_COMMUNITY): Payer: Self-pay

## 2014-11-19 NOTE — Telephone Encounter (Signed)
Made discharge phone call to patient per DROP protocol. Asking the following questions.    1. Do you have someone to care for you now that you are home?  yes 2. Are you having pain now that is not relieved by your pain medication?  no 3. Are you able to drink the recommended daily amount of fluids (48 ounces minimum/day) and protein (60-80 grams/day) as prescribed by the dietitian or nutritional counselor?  yes 4. Are you taking the vitamins and minerals as prescribed?  yes 5. Do you have the "on call" number to contact your surgeon if you have a problem or question?  yes 6. Are your incisions free of redness, swelling or drainage? (If steri strips, address that these can fall off, shower as tolerated) yes 7. Have your bowels moved since your surgery?  If not, are you passing gas?  yes 8. Are you up and walking 3-4 times per day?  yes    1. Do you have an appointment made to see your surgeon in the next month?  yes 2. Were you provided your discharge medications before your surgery or before you were discharged from the hospital and are you taking them without problem?  yes 3. Were you provided phone numbers to the clinic/surgeon's office?  yes 4. Did you watch the patient education video module in the (clinic, surgeon's office, etc.) before your surgery? no 5. Do you have a discharge checklist that was provided to you in the hospital to reference with instructions on how to take care of yourself after surgery?  yes 6. Did you see a dietitian or nutritional counselor while you were in the hospital?  yes 7. Do you have an appointment to see a dietitian or nutritional counselor in the next month?  yes   

## 2014-11-25 ENCOUNTER — Encounter: Payer: Medicare Other | Attending: General Surgery

## 2014-11-25 VITALS — Ht 69.0 in | Wt 294.0 lb

## 2014-11-25 DIAGNOSIS — Z713 Dietary counseling and surveillance: Secondary | ICD-10-CM | POA: Diagnosis not present

## 2014-11-25 DIAGNOSIS — Z6841 Body Mass Index (BMI) 40.0 and over, adult: Secondary | ICD-10-CM | POA: Diagnosis not present

## 2014-11-25 DIAGNOSIS — E119 Type 2 diabetes mellitus without complications: Secondary | ICD-10-CM | POA: Insufficient documentation

## 2014-11-25 NOTE — Progress Notes (Signed)
Bariatric Class:  Appt start time: 1530 end time:  1630.  2 Week Post-Operative Nutrition Class  Patient was seen on 11/25/14 for Post-Operative Nutrition education at [facility].   Surgery date: 09/29/14 Surgery type: RYGB Start weight at [facility]: 307 lbs on 12/26/13, 320.5 lbs on 09/08/14 Weight today: 294.0 lbs  Weight change: 26.5 lbs  TANITA  BODY COMP RESULTS  09/08/14 11/25/14   BMI (kg/m^2) 47.3 43.4   Fat Mass (lbs) 169 146.5   Fat Free Mass (lbs) 151.5 147.5   Total Body Water (lbs) 111 108.0    The following the learning objectives were met by the patient during this course:  Identifies Phase 3A (Soft, High Proteins) Dietary Goals and will begin from 2 weeks post-operatively to 2 months post-operatively  Identifies appropriate sources of fluids and proteins   States protein recommendations and appropriate sources post-operatively  Identifies the need for appropriate texture modifications, mastication, and bite sizes when consuming solids  Identifies appropriate multivitamin and calcium sources post-operatively  Describes the need for physical activity post-operatively and will follow MD recommendations  States when to call healthcare provider regarding medication questions or post-operative complications  Handouts given during class include:  Phase 3A: Soft, High Protein Diet Handout  Follow-Up Plan: Patient will follow-up at [facility] in 6 weeks for 2 month post-op nutrition visit for diet advancement per MD.

## 2015-01-05 ENCOUNTER — Encounter: Payer: Self-pay | Admitting: Dietician

## 2015-01-05 ENCOUNTER — Encounter: Payer: Medicare Other | Attending: General Surgery | Admitting: Dietician

## 2015-01-05 VITALS — Ht 69.0 in | Wt 282.0 lb

## 2015-01-05 DIAGNOSIS — E119 Type 2 diabetes mellitus without complications: Secondary | ICD-10-CM | POA: Insufficient documentation

## 2015-01-05 DIAGNOSIS — Z713 Dietary counseling and surveillance: Secondary | ICD-10-CM | POA: Insufficient documentation

## 2015-01-05 DIAGNOSIS — Z6841 Body Mass Index (BMI) 40.0 and over, adult: Secondary | ICD-10-CM | POA: Insufficient documentation

## 2015-01-05 NOTE — Patient Instructions (Addendum)
Goals:  Follow Phase 3B: High Protein + Non-Starchy Vegetables  Eat at least 3x a day (aim for 4 to 5x)  Protein shake, cheese, deli meat (try ham and Laughing Cow spreadable cheese), chili, sauteed shrimp, eggs, cottage cheese, sausage (try [place] or kielbasa sausage), egg/chicken/tuna salad  Try mixing strawberry protein powder with lemonade crystal light OR vanilla protein powder with orange crystal light  Try a zucchini boat pizza  Increase lean protein foods to meet 60g goal  Increase fluid intake to 64oz +  Avoid drinking 15 minutes before, during and 30 minutes after eating  Aim for >30 min of physical activity daily   Surgery date: 11/11/14 Surgery type: RYGB Start weight at [facility]: 307 lbs on 12/26/13, 320.5 lbs on 09/08/14 Weight today: 282 lbs Weight change: 12 lbs Total weight lost: 38.5 lbs  TANITA  BODY COMP RESULTS  09/08/14 11/25/14 01/05/15   BMI (kg/m^2) 47.3 43.4 41.6   Fat Mass (lbs) 169 146.5 141.5   Fat Free Mass (lbs) 151.5 147.5 140.5   Total Body Water (lbs) 111 108.0 103

## 2015-01-05 NOTE — Progress Notes (Signed)
  Follow-up visit:  2 months Post-Operative RYGB Surgery  Medical Nutrition Therapy:  Appt start time: 1115 end time:  1145.  Primary concerns today: Post-operative Bariatric Surgery Nutrition Management.  Myrl returns today having lost another 12 pounds. Her hands and feet are peeling. She visited the podiatrist and he said it was a fungus; has an appointment with a dermatologist this week. She is taking her vitamins regularly. Only eating 2x a day.   Surgery date: 11/11/14 Surgery type: RYGB Start weight at [facility]: 307 lbs on 12/26/13, 320.5 lbs on 09/08/14 Weight today: 282 lbs Weight change: 12 lbs Total weight lost: 38.5 lbs  TANITA  BODY COMP RESULTS  09/08/14 11/25/14 01/05/15   BMI (kg/m^2) 47.3 43.4 41.6   Fat Mass (lbs) 169 146.5 141.5   Fat Free Mass (lbs) 151.5 147.5 140.5   Total Body Water (lbs) 111 108.0 103    Preferred Learning Style:   No preference indicated   Learning Readiness:  Ready  24-hr recall:  Only eating 2x a day* Snacks on potato chips to "settle my stomach"  B (7AM): Isopure or Premier shake (15-30g) Snk (AM):   L (PM): 1-2 oz chicken (7-14g) Snk (PM):   D (PM): fish (7-14g), sometimes skips  Snk (PM):   Fluid intake: water and protein shake (over 64 oz per patient) Estimated total protein intake: unable to determine; likely insufficient  Medications: see list Supplementation: taking  CBG monitoring: 4x a day Average CBG per patient: 200s  Last patient reported A1c: 9.4% last month  Using straws: no Drinking while eating: no Hair loss: no Carbonated beverages: tried a soda and did not like it N/V/D/C: vomited 2x with fried fish, constipation Dumping syndrome: none  Recent physical activity:  Silver Sneakers and Curves (3-4x a week for an hour)  Progress Towards Goal(s):  In progress.  Handouts given during visit include:  Phase 3B lean protein + non starchy vegetables   Nutritional Diagnosis:  [place]-3.3 Overweight/obesity  related to past poor dietary habits and physical inactivity as evidenced by patient w/ recent RYGB surgery following dietary guidelines for continued weight loss.     Intervention:  Nutrition counseling provided. Goals:  Follow Phase 3B: High Protein + Non-Starchy Vegetables  Eat at least 3x a day (aim for 4 to 5x)  Protein shake, cheese, deli meat (try ham and Laughing Cow spreadable cheese), chili, sauteed shrimp, eggs, cottage cheese, sausage (try [place] or kielbasa sausage), egg/chicken/tuna salad  Try mixing strawberry protein powder with lemonade crystal light OR vanilla protein powder with orange crystal light  Try a zucchini boat pizza  Increase lean protein foods to meet 60g goal  Increase fluid intake to 64oz +  Avoid drinking 15 minutes before, during and 30 minutes after eating  Aim for >30 min of physical activity daily  Teaching Method Utilized:  Visual Auditory Hands on  Barriers to learning/adherence to lifestyle change: food aversions  Demonstrated degree of understanding via:  Teach Back   Monitoring/Evaluation:  Dietary intake, exercise, and body weight. Patient was requested to follow up in 6 weeks. Patient agrees to call [facility] for a follow up appointment.

## 2015-02-23 LAB — HEMOGLOBIN A1C: Hgb A1c MFr Bld: 9.3 % — AB (ref 4.0–6.0)

## 2015-03-02 ENCOUNTER — Encounter: Payer: Self-pay | Admitting: "Endocrinology

## 2015-03-02 ENCOUNTER — Ambulatory Visit (INDEPENDENT_AMBULATORY_CARE_PROVIDER_SITE_OTHER): Payer: Medicare Other | Admitting: "Endocrinology

## 2015-03-02 VITALS — BP 143/77 | HR 68 | Ht 69.0 in | Wt 274.0 lb

## 2015-03-02 DIAGNOSIS — E785 Hyperlipidemia, unspecified: Secondary | ICD-10-CM

## 2015-03-02 DIAGNOSIS — I10 Essential (primary) hypertension: Secondary | ICD-10-CM | POA: Diagnosis not present

## 2015-03-02 DIAGNOSIS — Z794 Long term (current) use of insulin: Secondary | ICD-10-CM | POA: Diagnosis not present

## 2015-03-02 DIAGNOSIS — E1142 Type 2 diabetes mellitus with diabetic polyneuropathy: Secondary | ICD-10-CM | POA: Insufficient documentation

## 2015-03-02 DIAGNOSIS — E1165 Type 2 diabetes mellitus with hyperglycemia: Secondary | ICD-10-CM

## 2015-03-02 DIAGNOSIS — E782 Mixed hyperlipidemia: Secondary | ICD-10-CM | POA: Insufficient documentation

## 2015-03-02 DIAGNOSIS — E118 Type 2 diabetes mellitus with unspecified complications: Secondary | ICD-10-CM | POA: Diagnosis not present

## 2015-03-02 DIAGNOSIS — IMO0002 Reserved for concepts with insufficient information to code with codable children: Secondary | ICD-10-CM

## 2015-03-02 HISTORY — DX: Type 2 diabetes mellitus with hyperglycemia: E11.65

## 2015-03-02 HISTORY — DX: Type 2 diabetes mellitus with hyperglycemia: E11.42

## 2015-03-02 HISTORY — DX: Mixed hyperlipidemia: E78.2

## 2015-03-02 MED ORDER — INSULIN ASPART 100 UNIT/ML ~~LOC~~ SOLN: 10.0000 [IU] | Freq: Three times a day (TID) | SUBCUTANEOUS | Status: DC

## 2015-03-02 MED ORDER — INSULIN GLARGINE 100 UNIT/ML ~~LOC~~ SOLN: 40.0000 [IU] | Freq: Every day | SUBCUTANEOUS | Status: DC

## 2015-03-02 NOTE — Progress Notes (Signed)
Subjective:    Patient ID: Emily Leon, female    DOB: 11-Apr-1951,    Past Medical History  Diagnosis Date  . Arthritis   . Diabetes mellitus without complication (HCC)   . Hypertension   . Chronic kidney disease   . Sleep apnea     uses CPAP  . Shortness of breath dyspnea     walking distances or climbing stairs  . Bronchitis     hx of   . Urinary frequency   . Cataracts, bilateral   . Cough   . Tingling     toes bilat    Past Surgical History  Procedure Laterality Date  . Knee surgery Left 2009  . Breath tek h pylori N/A 08/26/2014    Procedure: BREATH TEK H PYLORI;  Surgeon: Gaynelle Adu, MD;  Location: [facility];  Service: General;  Laterality: N/A;  . Abdominal hysterectomy    . Laparoscopic gastric sleeve resection N/A 11/11/2014    Procedure: LAPAROSCOPIC GASTRIC SLEEVE RESECTION;  Surgeon: Gaynelle Adu, MD;  Location: [facility];  Service: General;  Laterality: N/A;  . Upper gi endoscopy  11/11/2014    Procedure: UPPER GI ENDOSCOPY;  Surgeon: Gaynelle Adu, MD;  Location: [facility];  Service: General;;   Social History   Social History  . Marital Status: Married    Spouse Name: N/A  . Number of Children: N/A  . Years of Education: N/A   Social History Main Topics  . Smoking status: Never Smoker   . Smokeless tobacco: Never Used  . Alcohol Use: No  . Drug Use: No  . Sexual Activity: Not Asked   Other Topics Concern  . None   Social History Narrative   Outpatient Encounter Prescriptions as of 03/02/2015  Medication Sig  . amLODipine (NORVASC) 10 MG tablet Take 10 mg by mouth every morning.  Marland Kitchen atenolol (TENORMIN) 100 MG tablet Take 100 mg by mouth every morning.   . ergocalciferol (VITAMIN D2) 50000 UNITS capsule Take 50,000 Units by mouth every Monday.  . ezetimibe-simvastatin (VYTORIN) 10-40 MG per tablet Take 1 tablet by mouth at bedtime.   . hydrochlorothiazide (HYDRODIURIL) 25 MG tablet Take 12.5 mg by mouth every morning.   . insulin aspart  (NOVOLOG) 100 UNIT/ML injection Inject 10-16 Units into the skin 3 (three) times daily with meals.  . insulin glargine (LANTUS) 100 UNIT/ML injection Inject 0.4 mLs (40 Units total) into the skin at bedtime.  Marland Kitchen loratadine (CLARITIN) 10 MG tablet Take 10 mg by mouth daily.  . ondansetron (ZOFRAN ODT) 4 MG disintegrating tablet Take 1 tablet (4 mg total) by mouth every 8 (eight) hours as needed for nausea or vomiting.  Marland Kitchen oxyCODONE (ROXICODONE) 5 MG/5ML solution Take 5-10 mLs (5-10 mg total) by mouth every 4 (four) hours as needed for moderate pain or severe pain.  . pantoprazole (PROTONIX) 40 MG tablet Take 1 tablet (40 mg total) by mouth daily.  . valsartan (DIOVAN) 160 MG tablet Take 160 mg by mouth every morning.   . [DISCONTINUED] insulin aspart (NOVOLOG) 100 UNIT/ML injection Inject 0-20 Units into the skin every 4 (four) hours.  . [DISCONTINUED] insulin glargine (LANTUS) 100 UNIT/ML injection Inject 0.25 mLs (25 Units total) into the skin at bedtime.   No facility-administered encounter medications on file as of 03/02/2015.   ALLERGIES: Allergies  Allergen Reactions  . Penicillins Itching and Rash  . Sulfa Antibiotics Itching and Rash   VACCINATION STATUS:  There is no immunization history on  file for this patient.  Diabetes She presents for her follow-up diabetic visit. She has type 2 diabetes mellitus. Onset time: She was diagnosed at approximate age of 27 years. Her disease course has been improving. There are no hypoglycemic associated symptoms. Pertinent negatives for hypoglycemia include no confusion, headaches, pallor or seizures. There are no diabetic associated symptoms. Pertinent negatives for diabetes include no chest pain, no fatigue and no polyphagia. There are no hypoglycemic complications. Symptoms are improving. There are no diabetic complications. Risk factors for coronary artery disease include dyslipidemia, diabetes mellitus, obesity, hypertension and sedentary  lifestyle. Current diabetic treatment includes intensive insulin program. She is compliant with treatment most of the time. Her weight is decreasing steadily. She is following a generally healthy diet. She never participates in exercise. Her home blood glucose trend is decreasing steadily. Her overall blood glucose range is >200 mg/dl. An ACE inhibitor/angiotensin II receptor blocker is being taken. Eye exam is current.  Hyperlipidemia This is a chronic problem. The current episode started more than 1 year ago. The problem is controlled. Pertinent negatives include no chest pain, myalgias or shortness of breath. Risk factors for coronary artery disease include diabetes mellitus, dyslipidemia, hypertension, obesity and a sedentary lifestyle.  Hypertension This is a chronic problem. The current episode started more than 1 year ago. Pertinent negatives include no chest pain, headaches, palpitations or shortness of breath. Risk factors for coronary artery disease include dyslipidemia, diabetes mellitus, obesity and sedentary lifestyle. Past treatments include angiotensin blockers.     Review of Systems  Constitutional: Negative for fatigue and unexpected weight change.  HENT: Negative for trouble swallowing and voice change.   Eyes: Negative for visual disturbance.  Respiratory: Negative for cough, shortness of breath and wheezing.   Cardiovascular: Negative for chest pain, palpitations and leg swelling.  Gastrointestinal: Negative for nausea, vomiting and diarrhea.  Endocrine: Negative for cold intolerance, heat intolerance and polyphagia.  Musculoskeletal: Negative for myalgias and arthralgias.  Skin: Negative for color change, pallor, rash and wound.  Neurological: Negative for seizures and headaches.  Psychiatric/Behavioral: Negative for suicidal ideas and confusion.    Objective:    BP 143/77 mmHg  Pulse 68  Ht  (1.753 m)  Wt 274 lb (124.286 kg)  BMI 40.44 kg/m2  SpO2 99%  Wt  Readings from Last 3 Encounters:  03/02/15 274 lb (124.286 kg)  01/05/15 282 lb (127.914 kg)  11/25/14 294 lb (133.358 kg)    Physical Exam  Constitutional: She is oriented to person, place, and time. She appears well-developed.  HENT:  Head: Normocephalic and atraumatic.  Eyes: EOM are normal.  Neck: Normal range of motion. Neck supple. No tracheal deviation present. No thyromegaly present.  Cardiovascular: Normal rate and regular rhythm.   Pulmonary/Chest: Effort normal and breath sounds normal.  Abdominal: Soft. Bowel sounds are normal. There is no tenderness. There is no guarding.  Musculoskeletal: Normal range of motion. She exhibits no edema.  Neurological: She is alert and oriented to person, place, and time. She has normal reflexes. No cranial nerve deficit. Coordination normal.  Skin: Skin is warm and dry. No rash noted. No erythema. No pallor.  Psychiatric: She has a normal mood and affect. Judgment normal.    Complete Blood Count (Most recent): Lab Results  Component Value Date   WBC 11.9* 11/13/2014   HGB 13.4 11/13/2014   HCT 41.3 11/13/2014   MCV 90.2 11/13/2014   PLT 317 11/13/2014   Chemistry (most recent): Lab Results  Component Value  Date   NA 134* 11/12/2014   K 4.5 11/12/2014   CL 99* 11/12/2014   CO2 26 11/12/2014   BUN 13 11/12/2014   CREATININE 1.06* 11/12/2014   Diabetic Labs (most recent): Lab Results  Component Value Date   HGBA1C 9.3* 02/23/2015   HGBA1C 9.7* 11/12/2014      Assessment & Plan:   1. Uncontrolled type 2 diabetes mellitus with complication, with long-term current use of insulin (HCC)  Her diabetes is  complicated by obesity status post bariatric surgery and patient remains at a high risk for more acute and chronic complications of diabetes which include CAD, CVA, CKD, retinopathy, and neuropathy. These are all discussed in detail with the patient.  Patient came with persistently elevated glucose profile, and  recent A1c of  9.3% overall improving from 10.3 %.  Glucose logs and insulin administration records pertaining to this visit,  to be scanned into patient's records.  Recent labs reviewed.   - I have re-counseled the patient on diet management and weight loss  by adopting a carbohydrate restricted / protein rich  Diet. -Since her surgery she lost 42 pounds.  - Suggestion is made for patient to avoid simple carbohydrates   from their diet including Cakes , Desserts, Ice Cream,  Soda (  diet and regular) , Sweet Tea , Candies,  Chips, Cookies, Artificial Sweeteners,   and "Sugar-free" Products .  This will help patient to have stable blood glucose profile and potentially avoid unintended  Weight gain.  - Patient is advised to stick to a routine mealtimes to eat 3 meals  a day and avoid unnecessary snacks ( to snack only to correct hypoglycemia).   - I have approached patient with the following individualized plan to manage diabetes and patient agrees.  She is s/p sleeve GBP, EAG still high at 180-225.  Her recent a1c has been till high at 9.3% she lost 42 lbs overall. she is [facility].  she will increase Lantus to 40 units qhs, increase Novolog to 10 units TIDAC plus correction.  -I asked her to strictly monitor, document and bring her log meter in 12 weeks with labs for reeval and dose adjustment.  - she will likely need less insulin going forward. -She did not tolerate metformin, Invokana during prior trials. - Patient will be considered for incretin therapy as appropriate next visit. - Patient specific target  for A1c; LDL, HDL, Triglycerides, and  Waist Circumference were discussed in detail.  2) BP/HTN: Controlled. Continue current medications including ACEI/ARB. 3) Lipids/HPL:  continue statins /Vytorin. 4)  Weight/Diet: CDE consult in progress, exercise, and carbohydrates information provided.  5) Chronic Care/Health Maintenance:  -Patient  is on ACEI/ARB and Statin medications and encouraged to continue to follow up with Ophthalmology, Podiatrist at least yearly or according to recommendations, and advised to  stay away from smoking. I have recommended yearly flu vaccine and pneumonia vaccination at least every 5 years; moderate intensity exercise for up to 150 minutes weekly; and  sleep for at least 7 hours a day.  I advised patient to maintain close follow up with their PCP for primary care needs.  Patient is asked to bring meter and  blood glucose logs during their next visit.   Follow up plan: Return in about 3 months (around 06/02/2015) for diabetes, high blood pressure, high cholesterol.  Marquis LunchGebre Gwendolyn Mclees, MD Phone: [phone]  Fax: [phone]   03/02/2015, 7:03 PM

## 2015-03-02 NOTE — Patient Instructions (Signed)

## 2015-03-09 ENCOUNTER — Other Ambulatory Visit: Payer: Self-pay | Admitting: "Endocrinology

## 2015-05-28 LAB — HEMOGLOBIN A1C: Hemoglobin A1C: 9.6

## 2015-06-04 ENCOUNTER — Encounter: Payer: Self-pay | Admitting: "Endocrinology

## 2015-06-04 ENCOUNTER — Ambulatory Visit (INDEPENDENT_AMBULATORY_CARE_PROVIDER_SITE_OTHER): Payer: Medicare Other | Admitting: "Endocrinology

## 2015-06-04 VITALS — BP 133/89 | HR 68 | Ht 69.0 in | Wt 275.0 lb

## 2015-06-04 DIAGNOSIS — Z794 Long term (current) use of insulin: Secondary | ICD-10-CM

## 2015-06-04 DIAGNOSIS — I10 Essential (primary) hypertension: Secondary | ICD-10-CM

## 2015-06-04 DIAGNOSIS — E785 Hyperlipidemia, unspecified: Secondary | ICD-10-CM | POA: Diagnosis not present

## 2015-06-04 DIAGNOSIS — E118 Type 2 diabetes mellitus with unspecified complications: Secondary | ICD-10-CM | POA: Diagnosis not present

## 2015-06-04 DIAGNOSIS — E1165 Type 2 diabetes mellitus with hyperglycemia: Secondary | ICD-10-CM | POA: Diagnosis not present

## 2015-06-04 DIAGNOSIS — IMO0002 Reserved for concepts with insufficient information to code with codable children: Secondary | ICD-10-CM

## 2015-06-04 MED ORDER — INSULIN GLARGINE 100 UNIT/ML ~~LOC~~ SOLN: 50.0000 [IU] | Freq: Every day | SUBCUTANEOUS | Status: DC

## 2015-06-04 NOTE — Progress Notes (Signed)
Subjective:    Patient ID: Emily Leon, female    DOB: [birth year],    Past Medical History  Diagnosis Date  . Arthritis   . Diabetes mellitus without complication (HCC)   . Hypertension   . Chronic kidney disease   . Sleep apnea     uses CPAP  . Shortness of breath dyspnea     walking distances or climbing stairs  . Bronchitis     hx of   . Urinary frequency   . Cataracts, bilateral   . Cough   . Tingling     toes bilat    Past Surgical History  Procedure Laterality Date  . Knee surgery Left 2009  . Breath tek h pylori N/A 08/26/2014    Procedure: BREATH TEK H PYLORI;  Surgeon: Gaynelle Adu, MD;  Location: [facility];  Service: General;  Laterality: N/A;  . Abdominal hysterectomy    . Laparoscopic gastric sleeve resection N/A 11/11/2014    Procedure: LAPAROSCOPIC GASTRIC SLEEVE RESECTION;  Surgeon: Gaynelle Adu, MD;  Location: [facility];  Service: General;  Laterality: N/A;  . Upper gi endoscopy  11/11/2014    Procedure: UPPER GI ENDOSCOPY;  Surgeon: Gaynelle Adu, MD;  Location: [facility];  Service: General;;   Social History   Social History  . Marital Status: Married    Spouse Name: N/A  . Number of Children: N/A  . Years of Education: N/A   Social History Main Topics  . Smoking status: Never Smoker   . Smokeless tobacco: Never Used  . Alcohol Use: No  . Drug Use: No  . Sexual Activity: Not Asked   Other Topics Concern  . None   Social History Narrative   Outpatient Encounter Prescriptions as of 06/04/2015  Medication Sig  . insulin aspart (NOVOLOG) 100 UNIT/ML injection Inject 10-16 Units into the skin 3 (three) times daily with meals.  . insulin glargine (LANTUS) 100 UNIT/ML injection Inject 0.5 mLs (50 Units total) into the skin at bedtime.  . [DISCONTINUED] insulin glargine (LANTUS) 100 UNIT/ML injection Inject 0.4 mLs (40 Units total) into the skin at bedtime.  Marland Kitchen amLODipine (NORVASC) 10 MG tablet Take 10 mg by mouth every morning.  Marland Kitchen atenolol  (TENORMIN) 100 MG tablet Take 100 mg by mouth every morning.   . ergocalciferol (VITAMIN D2) 50000 UNITS capsule Take 50,000 Units by mouth every Monday.  . hydrochlorothiazide (HYDRODIURIL) 25 MG tablet Take 12.5 mg by mouth every morning.   . loratadine (CLARITIN) 10 MG tablet Take 10 mg by mouth daily.  . ondansetron (ZOFRAN ODT) 4 MG disintegrating tablet Take 1 tablet (4 mg total) by mouth every 8 (eight) hours as needed for nausea or vomiting.  Marland Kitchen oxyCODONE (ROXICODONE) 5 MG/5ML solution Take 5-10 mLs (5-10 mg total) by mouth every 4 (four) hours as needed for moderate pain or severe pain.  . pantoprazole (PROTONIX) 40 MG tablet Take 1 tablet (40 mg total) by mouth daily.  . valsartan (DIOVAN) 160 MG tablet Take 160 mg by mouth every morning.   Marland Kitchen VYTORIN 10-40 MG tablet TAKE 1 TABLET BY MOUTH AT BEDTIME   No facility-administered encounter medications on file as of 06/04/2015.   ALLERGIES: Allergies  Allergen Reactions  . Penicillins Itching and Rash  . Sulfa Antibiotics Itching and Rash   VACCINATION STATUS:  There is no immunization history on file for this patient.  Diabetes She presents for her follow-up diabetic visit. She has type 2 diabetes mellitus. Onset time: She  was diagnosed at approximate age of 27 years. Her disease course has been worsening. There are no hypoglycemic associated symptoms. Pertinent negatives for hypoglycemia include no confusion, headaches, pallor or seizures. There are no diabetic associated symptoms. Pertinent negatives for diabetes include no chest pain, no fatigue and no polyphagia. There are no hypoglycemic complications. Symptoms are worsening. There are no diabetic complications. Risk factors for coronary artery disease include dyslipidemia, diabetes mellitus, obesity, hypertension and sedentary lifestyle. Current diabetic treatment includes intensive insulin program. She is compliant with treatment some of the time. Her weight is stable (After losing  42 pounds, she has stabilized her weight. She weighs 275 pounds.). She is following a generally unhealthy diet. When asked about meal planning, she reported none. Prior visit with dietitian: She will be scheduled with a dietitian here. She never participates in exercise. Her home blood glucose trend is decreasing steadily. Her breakfast blood glucose range is generally >200 mg/dl. Her lunch blood glucose range is generally >200 mg/dl. Her dinner blood glucose range is generally >200 mg/dl. Her overall blood glucose range is >200 mg/dl. An ACE inhibitor/angiotensin II receptor blocker is being taken. Eye exam is current.  Hyperlipidemia This is a chronic problem. The current episode started more than 1 year ago. The problem is controlled. Pertinent negatives include no chest pain, myalgias or shortness of breath. Risk factors for coronary artery disease include diabetes mellitus, dyslipidemia, hypertension, obesity and a sedentary lifestyle.  Hypertension This is a chronic problem. The current episode started more than 1 year ago. Pertinent negatives include no chest pain, headaches, palpitations or shortness of breath. Risk factors for coronary artery disease include dyslipidemia, diabetes mellitus, obesity and sedentary lifestyle. Past treatments include angiotensin blockers.     Review of Systems  Constitutional: Negative for fatigue and unexpected weight change.  HENT: Negative for trouble swallowing and voice change.   Eyes: Negative for visual disturbance.  Respiratory: Negative for cough, shortness of breath and wheezing.   Cardiovascular: Negative for chest pain, palpitations and leg swelling.  Gastrointestinal: Negative for nausea, vomiting and diarrhea.  Endocrine: Negative for cold intolerance, heat intolerance and polyphagia.  Musculoskeletal: Negative for myalgias and arthralgias.  Skin: Negative for color change, pallor, rash and wound.  Neurological: Negative for seizures and  headaches.  Psychiatric/Behavioral: Negative for suicidal ideas and confusion.    Objective:    BP 133/89 mmHg  Pulse 68  Ht 5\' 9"  (1.753 m)  Wt 275 lb (124.739 kg)  BMI 40.59 kg/m2  SpO2 97%  Wt Readings from Last 3 Encounters:  06/04/15 275 lb (124.739 kg)  03/02/15 274 lb (124.286 kg)  01/05/15 282 lb (127.914 kg)    Physical Exam  Constitutional: She is oriented to person, place, and time. She appears well-developed.  HENT:  Head: Normocephalic and atraumatic.  Eyes: EOM are normal.  Neck: Normal range of motion. Neck supple. No tracheal deviation present. No thyromegaly present.  Cardiovascular: Normal rate and regular rhythm.   Pulmonary/Chest: Effort normal and breath sounds normal.  Abdominal: Soft. Bowel sounds are normal. There is no tenderness. There is no guarding.  Musculoskeletal: Normal range of motion. She exhibits no edema.  Neurological: She is alert and oriented to person, place, and time. She has normal reflexes. No cranial nerve deficit. Coordination normal.  Skin: Skin is warm and dry. No rash noted. No erythema. No pallor.  Psychiatric: She has a normal mood and affect. Judgment normal.    Complete Blood Count (Most recent): Lab Results  Component  Value Date   WBC 11.9* 11/13/2014   HGB 13.4 11/13/2014   HCT 41.3 11/13/2014   MCV 90.2 11/13/2014   PLT 317 11/13/2014   Chemistry (most recent): Lab Results  Component Value Date   NA 134* 11/12/2014   K 4.5 11/12/2014   CL 99* 11/12/2014   CO2 26 11/12/2014   BUN 13 11/12/2014   CREATININE 1.06* 11/12/2014   Diabetic Labs (most recent): Lab Results  Component Value Date   HGBA1C 9.6 05/28/2015   HGBA1C 9.3* 02/23/2015   HGBA1C 9.7* 11/12/2014      Assessment & Plan:   1. Uncontrolled type 2 diabetes mellitus with complication, with long-term current use of insulin (HCC)  Her diabetes is  complicated by obesity status post bariatric surgery and patient remains at a high risk for more  acute and chronic complications of diabetes which include CAD, CVA, CKD, retinopathy, and neuropathy. These are all discussed in detail with the patient.  Patient came with persistently elevated glucose profile, and  recent A1c  states high at 9.6%, overall improving from 10.3 %.   She misses several opportunities for insulin saying that she does not eat on a regular basis. Glucose logs and insulin administration records pertaining to this visit,  to be scanned into patient's records.  Recent labs reviewed.   - I have re-counseled the patient on diet management and weight loss  by adopting a carbohydrate restricted / protein rich  Diet. -Since her surgery she lost 42 pounds.  - Suggestion is made for patient to avoid simple carbohydrates   from their diet including Cakes , Desserts, Ice Cream,  Soda (  diet and regular) , Sweet Tea , Candies,  Chips, Cookies, Artificial Sweeteners,   and "Sugar-free" Products .  This will help patient to have stable blood glucose profile and potentially avoid unintended  Weight gain.  - Patient is advised to stick to a routine mealtimes to eat 3 meals  a day and avoid unnecessary snacks ( to snack only to correct hypoglycemia).   - I have approached patient with the following individualized plan to manage diabetes and patient agrees.  She is s/p sleeve GBP, EAG still high at 180-225.  Her recent a1c has been till high at 9.6% she lost 42 lbs overall, seems to have stabilized between 2 visits. she is encouraged to continue f/u at [facility] s/p Bariatric surgery.  she will increase Lantus to 50 units   and switch it to the morning since she is sleeping and forgetting Lantus at night, continue  Novolog to 10 units [facility] plus correction.  -I asked her to strictly monitor, document and bring her log meter in 12 weeks with labs for reeval and dose adjustment.  - she will likely need less insulin going forward. -She did not tolerate metformin, Invokana during  prior trials. - Patient will be considered for incretin therapy as appropriate next visit. - Patient specific target  for A1c; LDL, HDL, Triglycerides, and  Waist Circumference were discussed in detail.  2) BP/HTN: Controlled. Continue current medications including ACEI/ARB. 3) Lipids/HPL:  continue statins /Vytorin. 4)  Weight/Diet:   I will initiate CDE consult here in this clinic , exercise, and carbohydrates information provided.  5) Chronic Care/Health Maintenance:  -Patient is on ACEI/ARB and Statin medications and encouraged to continue to follow up with Ophthalmology, Podiatrist at least yearly or according to recommendations, and advised to  stay away from smoking. I have recommended yearly flu vaccine and  pneumonia vaccination at least every 5 years; moderate intensity exercise for up to 150 minutes weekly; and  sleep for at least 7 hours a day.  I advised patient to maintain close follow up with their PCP for primary care needs.  Patient is asked to bring meter and  blood glucose logs during their next visit.   Follow up plan: No Follow-up on file.  Marquis Lunch, MD Phone: [phone]  Fax: [phone]   06/04/2015, 11:20 AM

## 2015-06-04 NOTE — Patient Instructions (Signed)

## 2015-06-18 ENCOUNTER — Other Ambulatory Visit: Payer: Self-pay | Admitting: "Endocrinology

## 2015-06-30 ENCOUNTER — Other Ambulatory Visit: Payer: Self-pay | Admitting: "Endocrinology

## 2015-07-06 ENCOUNTER — Encounter: Payer: Medicare Other | Attending: "Endocrinology | Admitting: Nutrition

## 2015-07-06 ENCOUNTER — Encounter: Payer: Self-pay | Admitting: Nutrition

## 2015-07-06 VITALS — Ht 69.0 in | Wt 272.0 lb

## 2015-07-06 DIAGNOSIS — E118 Type 2 diabetes mellitus with unspecified complications: Secondary | ICD-10-CM | POA: Diagnosis present

## 2015-07-06 DIAGNOSIS — E1165 Type 2 diabetes mellitus with hyperglycemia: Secondary | ICD-10-CM

## 2015-07-06 DIAGNOSIS — Z794 Long term (current) use of insulin: Secondary | ICD-10-CM | POA: Diagnosis not present

## 2015-07-06 NOTE — Progress Notes (Signed)
  Medical Nutrition Therapy:  Appt start time: 0930 end time:  1030.   Assessment:  Primary concerns today: Diabetes Type 2. A1C 9.2%. Lives with her husband. He does the cooking and shopping. Most foods and baked and broiled. 50 units of Lantus and 30 unit of Novolog with meals With sliding scale. Had Sleeve gastric surgery July 2016. Lost 42 lbs. Initial wt. was 320 lbs before surgery. Testing blood sugars 4 times per day. Giving Lantus in am due to falling asleep at night. Has to make herself eat because she isn't hungry usually. She is not on Metformin due to history of diarrhea with it. She is willing to try it again for benefits. Goes to [employer] and [employer] 4 days a week. She wants to lose more weight and come off insulin all together. Diet is inconsistent in carbs. Isn't snacking but sometimes misses meals.  Preferred Learning Style:  No preference indicated   Learning Readiness:  Ready  Change in progress   MEDICATIONS:   DIETARY INTAKE:  24-hr recall:  B ( AM): Yogurt and Protein Drink  Snk ( AM): none  L ( PM): Toss salad with chicken, water Snk ( PM): none D ( PM): Chicken, rice or salad or grilled fish. water Snk ( PM):  Protein sometimes. Beverages: water  Usual physical activity: [employer]ne[employer] and [employer].  Estimated energy needs: 1200-1500 calories 170 g carbohydrates 120 g protein 39 g fat  Progress Towards Goal(s):  In progress.   Nutritional Diagnosis:  NB-1.1 Food and nutrition-related knowledge deficit As related to Diabetes.  As evidenced by A1C 9.2%.    Intervention:  Nutrition and Diabetes education provided on My Plate, CHO counting, meal planning, portion sizes, timing of meals, avoiding snacks between meals unless having a low blood sugar, target ranges for A1C and blood sugars, signs/symptoms and treatment of hyper/hypoglycemia, monitoring blood sugars, taking medications as prescribed, benefits of exercising 30 minutes per day and  prevention of complications of DM. Reviewed post gastric bypass surgery recommendations and importance of compliance with diet and increasing exercise gradually as tolerated for needed weight loss.  Goals: 1. Eat three meals per day. 2. Eat 1-2 carb choices per meal and focus on protein and low carb vegetables. 3. Drink. 64 oz  Or more of water per day 4. Exericise to 1 hr 4 days.. 5. Lose 1 lb per week. 6.  Chew foods slowly and chew thoroughly. 7. Drink liquids after meals 30 minutes 8. Get A1C down to 8.5%. 9. Talk to Dr. Fransico HimNida about 500 mg of Metformin ER  Teaching Method Utilized:  Visual Auditory Hands on  Handouts given during visit include:  The Plate Method   Meal Plan Card  Diabetes Instructions  Barriers to learning/adherence to lifestyle change: None  Demonstrated degree of understanding via:  Teach Back   Monitoring/Evaluation:  Dietary intake, exercise, meal planning, SBG, and body weight in 1 month(s). Would consider re-challenging pt. on extended release Metformin since she is willing to see if she can improve fasting blood sugars.

## 2015-07-06 NOTE — Patient Instructions (Signed)
Goals: 1. Eat three meals per day. 2. Eat 1-2 carb choices per meal and focus on protein and low carb vegetables. 3. Drink. 64 oz  Or more of water per day 4. Exericise to 1 hr 4 days.. 5. Lose 1 lb per week. 6.  Chew foods slowly and chew thoroughly. 7. Drink liquids after meals 30 minutes 8. Get A1C down to 8.5%. 9. Talk to Dr. Fransico HimNida about 500 mg of Metformin ER

## 2015-08-06 ENCOUNTER — Ambulatory Visit: Payer: Medicare Other | Admitting: Nutrition

## 2015-08-13 ENCOUNTER — Encounter: Payer: Self-pay | Admitting: Nutrition

## 2015-08-13 ENCOUNTER — Encounter: Payer: Medicare Other | Attending: "Endocrinology | Admitting: Nutrition

## 2015-08-13 VITALS — Ht 69.0 in | Wt 277.0 lb

## 2015-08-13 DIAGNOSIS — IMO0002 Reserved for concepts with insufficient information to code with codable children: Secondary | ICD-10-CM

## 2015-08-13 DIAGNOSIS — Z9884 Bariatric surgery status: Secondary | ICD-10-CM

## 2015-08-13 DIAGNOSIS — E118 Type 2 diabetes mellitus with unspecified complications: Secondary | ICD-10-CM | POA: Insufficient documentation

## 2015-08-13 DIAGNOSIS — E1165 Type 2 diabetes mellitus with hyperglycemia: Secondary | ICD-10-CM

## 2015-08-13 DIAGNOSIS — Z794 Long term (current) use of insulin: Secondary | ICD-10-CM | POA: Diagnosis not present

## 2015-08-13 NOTE — Patient Instructions (Signed)
Goals: 1. Eat three meals per day. 2. Eat 3 carb choices per meal 3. Take Nobolog with with all meals 4. Eat lunch before silver sneakers. 5. Keep exerciseing. 6. Talk to Dr.Nida about the Metformin ER at next visit. 7. Lose 1-2 lbs per meal.

## 2015-08-13 NOTE — Progress Notes (Signed)
  Medical Nutrition Therapy:  Appt start time: 0930 end time:  1030.   Assessment:  Primary concerns today: Diabetes Type 2. NO recent A1C. Has been doing [employer]. Gained 5 lbs. Is taking 50 units of Lantus in am and 10+ units of Novolog with meals. But hasn't been taking Novolog with breakfast because she was afraid to on top of the Lantus. BS are elevated at lunch and dinner. Has had some low blood sugars and having to correct for lows at times. Needs to eat lunch before exercising at noon. Diet is fairly well balanced with 2-3 carb choices per meal. Drinking water. No snacks.  Preferred Learning Style:  No preference indicated   Learning Readiness:  Ready  Change in progress   MEDICATIONS:   DIETARY INTAKE:  24-hr recall:  B ( AM):  12/ banana, protein drink and 1 slice toast and pB Snk ( AM): none  L ( PM): Fish, toss salad and apple,  Snk ( PM): none D ( PM): Chicken, rice or salad or grilled fish. water Snk ( PM):   Beverages: water  Usual physical activity: [employer]neakers and [employer].  Estimated energy needs: 1200-1500 calories 170 g carbohydrates 120 g protein 39 g fat  Progress Towards Goal(s):  In progress.   Nutritional Diagnosis:  NB-1.1 Food and nutrition-related knowledge deficit As related to Diabetes.  As evidenced by A1C 9.2%.    Intervention:  Nutrition and Diabetes education provided on My Plate, CHO counting, meal planning, portion sizes, timing of meals, avoiding snacks between meals unless having a low blood sugar, target ranges for A1C and blood sugars, signs/symptoms and treatment of hyper/hypoglycemia, monitoring blood sugars, taking medications as prescribed, benefits of exercising 30 minutes per day and prevention of complications of DM. Reviewed post gastric bypass surgery recommendations and importance of compliance with diet and increasing exercise gradually as tolerated for needed weight loss.  Goals: 1. Eat three meals per day. 2. Eat  3 carb choices per meal 3. Take Nobolog with with all meals 4. Eat lunch before silver sneakers. 5. Keep exerciseing. 6. Talk to Dr.Nida about the Metformin ER at next visit. 7. Lose 1-2 lbs per week.  Teaching Method Utilized:  Visual Auditory Hands on  Handouts given during visit include:  The Plate Method   Meal Plan Card  Diabetes Instructions  Barriers to learning/adherence to lifestyle change: None  Demonstrated degree of understanding via:  Teach Back   Monitoring/Evaluation:  Dietary intake, exercise, meal planning, SBG, and body weight in 1 month(s). Would consider re-challenging pt. on extended release Metformin since she is willing to see if she can improve fasting blood sugars.

## 2015-08-18 ENCOUNTER — Encounter: Payer: Self-pay | Admitting: "Endocrinology

## 2015-09-03 LAB — HEMOGLOBIN A1C: Hemoglobin A1C: 9.1

## 2015-09-07 ENCOUNTER — Encounter: Payer: Medicare Other | Attending: "Endocrinology | Admitting: Nutrition

## 2015-09-07 ENCOUNTER — Ambulatory Visit (INDEPENDENT_AMBULATORY_CARE_PROVIDER_SITE_OTHER): Payer: Medicare Other | Admitting: "Endocrinology

## 2015-09-07 ENCOUNTER — Encounter: Payer: Self-pay | Admitting: "Endocrinology

## 2015-09-07 VITALS — BP 139/80 | HR 90 | Ht 69.0 in | Wt 280.0 lb

## 2015-09-07 DIAGNOSIS — IMO0002 Reserved for concepts with insufficient information to code with codable children: Secondary | ICD-10-CM

## 2015-09-07 DIAGNOSIS — I10 Essential (primary) hypertension: Secondary | ICD-10-CM

## 2015-09-07 DIAGNOSIS — E118 Type 2 diabetes mellitus with unspecified complications: Secondary | ICD-10-CM

## 2015-09-07 DIAGNOSIS — Z794 Long term (current) use of insulin: Secondary | ICD-10-CM | POA: Diagnosis not present

## 2015-09-07 DIAGNOSIS — E1165 Type 2 diabetes mellitus with hyperglycemia: Secondary | ICD-10-CM

## 2015-09-07 DIAGNOSIS — E785 Hyperlipidemia, unspecified: Secondary | ICD-10-CM

## 2015-09-07 DIAGNOSIS — E66813 Obesity, class 3: Secondary | ICD-10-CM

## 2015-09-07 MED ORDER — METFORMIN HCL ER (MOD) 500 MG PO TB24: 500.0000 mg | ORAL_TABLET | Freq: Every day | ORAL | Status: DC

## 2015-09-07 NOTE — Patient Instructions (Addendum)
Goals: 1. Eat three meals per day. 2. Eat 3 carb choices per meal 3. Take Novolog with with all meals 4. Exercise  5 days a week. 5. Keep exerciseing. 6. Talk to Dr.Nida about the Metformin ER at next visit. 7. Lose 1-2 lbs per week. 8. Get A1C down to 8%. 9. Take sliding scale Novolog with meals correctly. Do not miss doses of insulins. 10. Lose 10 lbs in the next three months.

## 2015-09-07 NOTE — Patient Instructions (Signed)

## 2015-09-07 NOTE — Progress Notes (Signed)
Medical Nutrition Therapy:  Appt start time: 1000end time:  1030.   Assessment:  Primary concerns today: Diabetes Type 2.  A1C down to 9.1% from 9.6%.   Lantas 50 units at breakfast 10 units of Novolog with meals plus sliding scale. To see Dr. Fransico HimNida today. Gained 3 lbs. Doesn't know why she is gaining weight. Has lost over 50 lbs from bariatric surgery. Has missed some doses of insulin at meal times. Just started back working out at [employer] and going to [employer] again.  Eating three meals per day. Denies over eating or eating junk food. Did splurge on Mothers Day. Occassionaly has sweet tea. She notes she doesn't drink while eating meals. May benefit from more protein in her diet. Diet needs more protein and she needs to increase exercise. Needs to be more consistent with taking insulin and using the sliding scale correctly.  Lab Results  Component Value Date   HGBA1C 9.6 05/28/2015   NO recent A1C. Has been doing [employer]. Gained 5 lbs. Is taking 50 units of Lantus in am and 10+ units of Novolog with meals. But hasn't been taking Novolog with breakfast because she was afraid to on top of the Lantus. BS are elevated at lunch and dinner. Has had some low blood sugars and having to correct for lows at times. Needs to eat lunch before exercising at noon. Diet is fairly well balanced with 2-3 carb choices per meal. Drinking water. No snacks.  Preferred Learning Style:  No preference indicated   Learning Readiness:  Ready  Change in progress   MEDICATIONS:   DIETARY INTAKE:  24-hr recall:  B ( AM):  1/2/ banana, 1 egg and 1-2 slices toast Snk ( AM): none  L ( PM): Chicken  Salad and fruit, water D ( PM): Chicken salad and cracker and fruit. water Snk ( PM):   Beverages: water  Usual physical activity: [employer]ne[employer] and [employer].  Estimated energy needs: 1200-1500 calories 170 g carbohydrates 120 g protein 39 g fat  Progress Towards Goal(s):  In  progress.   Nutritional Diagnosis:  NB-1.1 Food and nutrition-related knowledge deficit As related to Diabetes.  As evidenced by A1C 9.2%.    Intervention:  Nutrition and Diabetes education provided on My Plate, CHO counting, meal planning, portion sizes, timing of meals, avoiding snacks between meals unless having a low blood sugar, target ranges for A1C and blood sugars, signs/symptoms and treatment of hyper/hypoglycemia, monitoring blood sugars, taking medications as prescribed, benefits of exercising 30 minutes per day and prevention of complications of DM. Reviewed post gastric bypass surgery recommendations and importance of compliance with diet and increasing exercise gradually as tolerated for needed weight loss.  Goals: 1. Eat three meals per day. 2. Eat 2- 3 carb choices per meal 3. Take Novolog with with all meals 4. Exercise  5 days a week. 5. . Talk to Dr.Nida about the Metformin ER at next visit. 7. Lose 1-2 lbs per week. 8. Get A1C down to 8%. 9. Take sliding scale Novolog with meals correctly. Do not miss doses of insulins. 10. Lose 10 lbs in the next three months Keep food journal and bring at next visit.   Teaching Method Utilized:  Visual Auditory Hands on  Handouts given during visit include:  The Plate Method   Meal Plan Card  Diabetes Instructions  Barriers to learning/adherence to lifestyle change: None  Demonstrated degree of understanding via:  Teach Back   Monitoring/Evaluation:  Dietary intake, exercise, meal  planning, SBG, and body weight in 1 month(s). Would consider re-challenging pt. on extended release Metformin since she is willing to see if she can improve fasting blood sugars.

## 2015-09-07 NOTE — Progress Notes (Signed)
Subjective:    Patient ID: Emily Leon, female    DOB: 06/28/1950,    Past Medical History  Diagnosis Date  . Arthritis   . Diabetes mellitus without complication (HCC)   . Hypertension   . Chronic kidney disease   . Sleep apnea     uses CPAP  . Shortness of breath dyspnea     walking distances or climbing stairs  . Bronchitis     hx of   . Urinary frequency   . Cataracts, bilateral   . Cough   . Tingling     toes bilat    Past Surgical History  Procedure Laterality Date  . Knee surgery Left 2009  . Breath tek h pylori N/A 08/26/2014    Procedure: BREATH TEK H PYLORI;  Surgeon: Gaynelle Adu, MD;  Location: [facility];  Service: General;  Laterality: N/A;  . Abdominal hysterectomy    . Laparoscopic gastric sleeve resection N/A 11/11/2014    Procedure: LAPAROSCOPIC GASTRIC SLEEVE RESECTION;  Surgeon: Gaynelle Adu, MD;  Location: [facility];  Service: General;  Laterality: N/A;  . Upper gi endoscopy  11/11/2014    Procedure: UPPER GI ENDOSCOPY;  Surgeon: Gaynelle Adu, MD;  Location: [facility];  Service: General;;   Social History   Social History  . Marital Status: Married    Spouse Name: N/A  . Number of Children: N/A  . Years of Education: N/A   Social History Main Topics  . Smoking status: Never Smoker   . Smokeless tobacco: Never Used  . Alcohol Use: No  . Drug Use: No  . Sexual Activity: Not Asked   Other Topics Concern  . None   Social History Narrative   Outpatient Encounter Prescriptions as of 09/07/2015  Medication Sig  . amLODipine (NORVASC) 10 MG tablet Take 10 mg by mouth every morning.  Marland Kitchen atenolol (TENORMIN) 100 MG tablet Take 100 mg by mouth every morning.   . ergocalciferol (VITAMIN D2) 50000 UNITS capsule Take 50,000 Units by mouth every Monday.  . hydrochlorothiazide (HYDRODIURIL) 25 MG tablet Take 12.5 mg by mouth every morning.   . Insulin Glargine (LANTUS SOLOSTAR) 100 UNIT/ML Solostar Pen Inject 50 Units into the skin at bedtime.  Marland Kitchen  loratadine (CLARITIN) 10 MG tablet Take 10 mg by mouth daily.  Marland Kitchen NOVOLOG FLEXPEN 100 UNIT/ML FlexPen INJECT 30 UNITS SUB-Q 3 TIMES A DAY  . ondansetron (ZOFRAN ODT) 4 MG disintegrating tablet Take 1 tablet (4 mg total) by mouth every 8 (eight) hours as needed for nausea or vomiting.  Marland Kitchen oxyCODONE (ROXICODONE) 5 MG/5ML solution Take 5-10 mLs (5-10 mg total) by mouth every 4 (four) hours as needed for moderate pain or severe pain.  . pantoprazole (PROTONIX) 40 MG tablet Take 1 tablet (40 mg total) by mouth daily.  . valsartan (DIOVAN) 160 MG tablet Take 160 mg by mouth every morning.   Marland Kitchen VYTORIN 10-40 MG tablet TAKE 1 TABLET BY MOUTH AT BEDTIME   No facility-administered encounter medications on file as of 09/07/2015.   ALLERGIES: Allergies  Allergen Reactions  . Penicillins Itching and Rash  . Sulfa Antibiotics Itching and Rash   VACCINATION STATUS:  There is no immunization history on file for this patient.  Diabetes She presents for her follow-up diabetic visit. She has type 2 diabetes mellitus. Onset time: She was diagnosed at approximate age of 27 years. Her disease course has been improving. There are no hypoglycemic associated symptoms. Pertinent negatives for hypoglycemia include no  confusion, headaches, pallor or seizures. There are no diabetic associated symptoms. Pertinent negatives for diabetes include no chest pain, no fatigue and no polyphagia. There are no hypoglycemic complications. Symptoms are improving. There are no diabetic complications. Risk factors for coronary artery disease include dyslipidemia, diabetes mellitus, obesity, hypertension and sedentary lifestyle. Current diabetic treatment includes intensive insulin program. She is compliant with treatment some of the time. Her weight is stable (After losing 40 pounds, she has stabilized at 280 lbs of weight). She is following a generally unhealthy diet. When asked about meal planning, she reported none. Prior visit with  dietitian: She will be scheduled with a dietitian here. She never participates in exercise. Her home blood glucose trend is decreasing steadily. Her breakfast blood glucose range is generally 180-200 mg/dl. Her lunch blood glucose range is generally 180-200 mg/dl. Her dinner blood glucose range is generally >200 mg/dl. Her overall blood glucose range is >200 mg/dl. An ACE inhibitor/angiotensin II receptor blocker is being taken. Eye exam is current.  Hyperlipidemia This is a chronic problem. The current episode started more than 1 year ago. The problem is controlled. Pertinent negatives include no chest pain, myalgias or shortness of breath. Risk factors for coronary artery disease include diabetes mellitus, dyslipidemia, hypertension, obesity and a sedentary lifestyle.  Hypertension This is a chronic problem. The current episode started more than 1 year ago. Pertinent negatives include no chest pain, headaches, palpitations or shortness of breath. Risk factors for coronary artery disease include dyslipidemia, diabetes mellitus, obesity and sedentary lifestyle. Past treatments include angiotensin blockers.     Review of Systems  Constitutional: Negative for fatigue and unexpected weight change.  HENT: Negative for trouble swallowing and voice change.   Eyes: Negative for visual disturbance.  Respiratory: Negative for cough, shortness of breath and wheezing.   Cardiovascular: Negative for chest pain, palpitations and leg swelling.  Gastrointestinal: Negative for nausea, vomiting and diarrhea.  Endocrine: Negative for cold intolerance, heat intolerance and polyphagia.  Musculoskeletal: Negative for myalgias and arthralgias.  Skin: Negative for color change, pallor, rash and wound.  Neurological: Negative for seizures and headaches.  Psychiatric/Behavioral: Negative for suicidal ideas and confusion.    Objective:    BP 139/80 mmHg  Pulse 90  Ht 5\' 9"  (1.753 m)  Wt 280 lb (127.007 kg)  BMI  41.33 kg/m2  SpO2 95%  Wt Readings from Last 3 Encounters:  09/07/15 280 lb (127.007 kg)  09/07/15 280 lb (127.007 kg)  08/13/15 277 lb (125.646 kg)    Physical Exam  Constitutional: She is oriented to person, place, and time. She appears well-developed.  HENT:  Head: Normocephalic and atraumatic.  Eyes: EOM are normal.  Neck: Normal range of motion. Neck supple. No tracheal deviation present. No thyromegaly present.  Cardiovascular: Normal rate and regular rhythm.   Pulmonary/Chest: Effort normal and breath sounds normal.  Abdominal: Soft. Bowel sounds are normal. There is no tenderness. There is no guarding.  Musculoskeletal: Normal range of motion. She exhibits no edema.  Neurological: She is alert and oriented to person, place, and time. She has normal reflexes. No cranial nerve deficit. Coordination normal.  Skin: Skin is warm and dry. No rash noted. No erythema. No pallor.  Psychiatric: She has a normal mood and affect. Judgment normal.    Complete Blood Count (Most recent): Lab Results  Component Value Date   WBC 11.9* 11/13/2014   HGB 13.4 11/13/2014   HCT 41.3 11/13/2014   MCV 90.2 11/13/2014   PLT 317 11/13/2014  Chemistry (most recent): Lab Results  Component Value Date   NA 134* 11/12/2014   K 4.5 11/12/2014   CL 99* 11/12/2014   CO2 26 11/12/2014   BUN 13 11/12/2014   CREATININE 1.06* 11/12/2014   Diabetic Labs (most recent): Lab Results  Component Value Date   HGBA1C 9.1 09/03/2015   HGBA1C 9.6 05/28/2015   HGBA1C 9.3* 02/23/2015      Assessment & Plan:   1. Uncontrolled type 2 diabetes mellitus with complication, with long-term current use of insulin (HCC)  Her diabetes is  complicated by obesity status post bariatric surgery and patient remains at a high risk for more acute and chronic complications of diabetes which include CAD, CVA, CKD, retinopathy, and neuropathy. These are all discussed in detail with the patient.  Patient came with  persistently elevated glucose profile, and  recent A1c is 9.1%,  overall improving from 10.3 %.   She misses several opportunities for insulin saying that she does not eat on a regular basis. Glucose logs and insulin administration records pertaining to this visit,  to be scanned into patient's records.  Recent labs reviewed.   - I have re-counseled the patient on diet management and weight loss  by adopting a carbohydrate restricted / protein rich  Diet. -Since her surgery she lost 40 pounds, and has stabilized.  - Suggestion is made for patient to avoid simple carbohydrates   from their diet including Cakes , Desserts, Ice Cream,  Soda (  diet and regular) , Sweet Tea , Candies,  Chips, Cookies, Artificial Sweeteners,   and "Sugar-free" Products .  This will help patient to have stable blood glucose profile and potentially avoid unintended  Weight gain.  - Patient is advised to stick to a routine mealtimes to eat 3 meals  a day and avoid unnecessary snacks ( to snack only to correct hypoglycemia).   - I have approached patient with the following individualized plan to manage diabetes and patient agrees.  She is s/p sleeve GBP, EAG still high at 180-225.  she is encouraged to continue f/u at [facility] s/p Bariatric surgery.  - Emphasizing compliance on consistency is first timing of insulin and food, she is advised to continue  Lantus  50 units   In  morning since she is sleeping and forgetting Lantus at night, continue  Novolog to 10 units [facility] plus correction.  -I asked her to strictly monitor, document and bring her log meter in 12 weeks with labs for reeval and dose adjustment.  - she will likely need less insulin going forward. -She did not tolerate metformin, Invokana during prior trials. -I will prescribe Glumetza 500 mg extended release once a day.  - Patient will be considered for incretin therapy as appropriate next visit. - Patient specific target  for A1c; LDL, HDL,  Triglycerides, and  Waist Circumference were discussed in detail.  2) BP/HTN: Controlled. Continue current medications including ACEI/ARB. 3) Lipids/HPL:  continue statins /Vytorin. 4)  Weight/Diet:   I will initiate CDE consult here in this clinic , exercise, and carbohydrates information provided.  5) Chronic Care/Health Maintenance:  -Patient is on ACEI/ARB and Statin medications and encouraged to continue to follow up with Ophthalmology, Podiatrist at least yearly or according to recommendations, and advised to  stay away from smoking. I have recommended yearly flu vaccine and pneumonia vaccination at least every 5 years; moderate intensity exercise for up to 150 minutes weekly; and  sleep for at least 7 hours a  day.  I advised patient to maintain close follow up with their PCP for primary care needs.  Patient is asked to bring meter and  blood glucose logs during their next visit.   Follow up plan: Return in about 3 months (around 12/08/2015) for diabetes, high blood pressure, high cholesterol.  Marquis Lunch, MD Phone: [phone]  Fax: [phone]   09/07/2015, 11:24 AM

## 2015-10-02 ENCOUNTER — Other Ambulatory Visit: Payer: Self-pay | Admitting: "Endocrinology

## 2015-11-02 ENCOUNTER — Other Ambulatory Visit: Payer: Self-pay | Admitting: "Endocrinology

## 2015-11-14 ENCOUNTER — Other Ambulatory Visit: Payer: Self-pay | Admitting: "Endocrinology

## 2015-11-19 ENCOUNTER — Other Ambulatory Visit: Payer: Self-pay | Admitting: *Deleted

## 2015-11-19 MED ORDER — METFORMIN HCL ER (MOD) 500 MG PO TB24: 500.0000 mg | ORAL_TABLET | Freq: Every day | ORAL | 3 refills | Status: DC

## 2015-11-20 ENCOUNTER — Other Ambulatory Visit: Payer: Self-pay

## 2015-11-20 MED ORDER — METFORMIN HCL ER (MOD) 500 MG PO TB24: 500.0000 mg | ORAL_TABLET | Freq: Every day | ORAL | 0 refills | Status: DC

## 2015-12-28 ENCOUNTER — Ambulatory Visit: Payer: Medicare Other | Admitting: Nutrition

## 2015-12-28 ENCOUNTER — Ambulatory Visit: Payer: Medicare Other | Admitting: "Endocrinology

## 2016-01-05 ENCOUNTER — Other Ambulatory Visit: Payer: Self-pay | Admitting: "Endocrinology

## 2016-01-06 ENCOUNTER — Encounter: Payer: Self-pay | Admitting: "Endocrinology

## 2016-01-14 ENCOUNTER — Ambulatory Visit (INDEPENDENT_AMBULATORY_CARE_PROVIDER_SITE_OTHER): Payer: Medicare Other | Admitting: "Endocrinology

## 2016-01-14 ENCOUNTER — Encounter: Payer: Self-pay | Admitting: "Endocrinology

## 2016-01-14 VITALS — BP 143/84 | HR 90 | Ht 69.0 in | Wt 279.0 lb

## 2016-01-14 DIAGNOSIS — I10 Essential (primary) hypertension: Secondary | ICD-10-CM

## 2016-01-14 DIAGNOSIS — E1165 Type 2 diabetes mellitus with hyperglycemia: Secondary | ICD-10-CM | POA: Diagnosis not present

## 2016-01-14 DIAGNOSIS — IMO0002 Reserved for concepts with insufficient information to code with codable children: Secondary | ICD-10-CM

## 2016-01-14 DIAGNOSIS — E118 Type 2 diabetes mellitus with unspecified complications: Secondary | ICD-10-CM | POA: Diagnosis not present

## 2016-01-14 DIAGNOSIS — E785 Hyperlipidemia, unspecified: Secondary | ICD-10-CM | POA: Diagnosis not present

## 2016-01-14 DIAGNOSIS — Z794 Long term (current) use of insulin: Secondary | ICD-10-CM | POA: Diagnosis not present

## 2016-01-14 MED ORDER — INSULIN ASPART 100 UNIT/ML FLEXPEN: 12.0000 [IU] | PEN_INJECTOR | Freq: Three times a day (TID) | SUBCUTANEOUS | 3 refills | Status: DC

## 2016-01-14 MED ORDER — INSULIN GLARGINE 100 UNIT/ML SOLOSTAR PEN: 60.0000 [IU] | PEN_INJECTOR | Freq: Every morning | SUBCUTANEOUS | 3 refills | Status: DC

## 2016-01-14 NOTE — Patient Instructions (Signed)

## 2016-01-14 NOTE — Progress Notes (Signed)
Subjective:    Patient ID: Emily Leon, female    DOB: 1950/07/02,    Past Medical History:  Diagnosis Date  . Arthritis   . Bronchitis    hx of   . Cataracts, bilateral   . Chronic kidney disease   . Cough   . Diabetes mellitus without complication (HCC)   . Hypertension   . Shortness of breath dyspnea    walking distances or climbing stairs  . Sleep apnea    uses CPAP  . Tingling    toes bilat   . Urinary frequency    Past Surgical History:  Procedure Laterality Date  . ABDOMINAL HYSTERECTOMY    . BREATH TEK H PYLORI N/A 08/26/2014   Procedure: BREATH TEK H PYLORI;  Surgeon: Gaynelle Adu, MD;  Location: [facility];  Service: General;  Laterality: N/A;  . KNEE SURGERY Left 2009  . LAPAROSCOPIC GASTRIC SLEEVE RESECTION N/A 11/11/2014   Procedure: LAPAROSCOPIC GASTRIC SLEEVE RESECTION;  Surgeon: Gaynelle Adu, MD;  Location: [facility];  Service: General;  Laterality: N/A;  . UPPER GI ENDOSCOPY  11/11/2014   Procedure: UPPER GI ENDOSCOPY;  Surgeon: Gaynelle Adu, MD;  Location: [facility];  Service: General;;   Social History   Social History  . Marital status: Married    Spouse name: N/A  . Number of children: N/A  . Years of education: N/A   Social History Main Topics  . Smoking status: Never Smoker  . Smokeless tobacco: Never Used  . Alcohol use No  . Drug use: No  . Sexual activity: Not Asked   Other Topics Concern  . None   Social History Narrative  . None   Outpatient Encounter Prescriptions as of 01/14/2016  Medication Sig  . amLODipine (NORVASC) 10 MG tablet Take 10 mg by mouth every morning.  Marland Kitchen atenolol (TENORMIN) 100 MG tablet Take 100 mg by mouth every morning.   . B-D UF III MINI PEN NEEDLES 31G X 5 MM MISC USE 4 TIMES DAILY AS DIRECTED  . ergocalciferol (VITAMIN D2) 50000 UNITS capsule Take 50,000 Units by mouth every Monday.  . hydrochlorothiazide (HYDRODIURIL) 25 MG tablet Take 12.5 mg by mouth every morning.   . insulin aspart (NOVOLOG FLEXPEN)  100 UNIT/ML FlexPen Inject 12-18 Units into the skin 3 (three) times daily with meals.  . Insulin Glargine (LANTUS SOLOSTAR) 100 UNIT/ML Solostar Pen Inject 60 Units into the skin every morning.  . loratadine (CLARITIN) 10 MG tablet Take 10 mg by mouth daily.  . metFORMIN (GLUMETZA) 500 MG (MOD) 24 hr tablet Take 1 tablet (500 mg total) by mouth daily with breakfast.  . ondansetron (ZOFRAN ODT) 4 MG disintegrating tablet Take 1 tablet (4 mg total) by mouth every 8 (eight) hours as needed for nausea or vomiting.  Marland Kitchen oxyCODONE (ROXICODONE) 5 MG/5ML solution Take 5-10 mLs (5-10 mg total) by mouth every 4 (four) hours as needed for moderate pain or severe pain.  . pantoprazole (PROTONIX) 40 MG tablet Take 1 tablet (40 mg total) by mouth daily.  . valsartan (DIOVAN) 160 MG tablet Take 160 mg by mouth every morning.   Marland Kitchen VYTORIN 10-40 MG tablet TAKE 1 TABLET BY MOUTH AT BEDTIME  . [DISCONTINUED] LANTUS SOLOSTAR 100 UNIT/ML Solostar Pen INJECT 50 UNITS SUBCUTANEOUSLY AT BEDTIME.  . [DISCONTINUED] NOVOLOG FLEXPEN 100 UNIT/ML FlexPen INJECT 30 UNITS SUB-Q 3 TIMES A DAY   No facility-administered encounter medications on file as of 01/14/2016.    ALLERGIES: Allergies  Allergen  Reactions  . Penicillins Itching and Rash  . Sulfa Antibiotics Itching and Rash   VACCINATION STATUS:  There is no immunization history on file for this patient.  Diabetes  She presents for her follow-up diabetic visit. She has type 2 diabetes mellitus. Onset time: She was diagnosed at approximate age of 27 years. Her disease course has been improving. There are no hypoglycemic associated symptoms. Pertinent negatives for hypoglycemia include no confusion, headaches, pallor or seizures. There are no diabetic associated symptoms. Pertinent negatives for diabetes include no chest pain, no fatigue and no polyphagia. There are no hypoglycemic complications. Symptoms are improving. There are no diabetic complications. Risk factors for  coronary artery disease include dyslipidemia, diabetes mellitus, obesity, hypertension and sedentary lifestyle. Current diabetic treatment includes intensive insulin program. She is compliant with treatment some of the time. Her weight is stable (After losing 40 pounds, she has stabilized at 280 lbs of weight). She is following a generally unhealthy diet. When asked about meal planning, she reported none. Prior visit with dietitian: She will be scheduled with a dietitian here. She never participates in exercise. Her home blood glucose trend is decreasing steadily. Her breakfast blood glucose range is generally 180-200 mg/dl. Her lunch blood glucose range is generally 180-200 mg/dl. Her dinner blood glucose range is generally >200 mg/dl. Her overall blood glucose range is >200 mg/dl. An ACE inhibitor/angiotensin II receptor blocker is being taken. Eye exam is current.  Hyperlipidemia  This is a chronic problem. The current episode started more than 1 year ago. The problem is controlled. Pertinent negatives include no chest pain, myalgias or shortness of breath. Risk factors for coronary artery disease include diabetes mellitus, dyslipidemia, hypertension, obesity and a sedentary lifestyle.  Hypertension  This is a chronic problem. The current episode started more than 1 year ago. Pertinent negatives include no chest pain, headaches, palpitations or shortness of breath. Risk factors for coronary artery disease include dyslipidemia, diabetes mellitus, obesity and sedentary lifestyle. Past treatments include angiotensin blockers.     Review of Systems  Constitutional: Negative for fatigue and unexpected weight change.  HENT: Negative for trouble swallowing and voice change.   Eyes: Negative for visual disturbance.  Respiratory: Negative for cough, shortness of breath and wheezing.   Cardiovascular: Negative for chest pain, palpitations and leg swelling.  Gastrointestinal: Negative for diarrhea, nausea and  vomiting.  Endocrine: Negative for cold intolerance, heat intolerance and polyphagia.  Musculoskeletal: Negative for arthralgias and myalgias.  Skin: Negative for color change, pallor, rash and wound.  Neurological: Negative for seizures and headaches.  Psychiatric/Behavioral: Negative for confusion and suicidal ideas.    Objective:    BP (!) 143/84   Pulse 90   Ht 5\' 9"  (1.753 m)   Wt 279 lb (126.6 kg)   BMI 41.20 kg/m   Wt Readings from Last 3 Encounters:  01/14/16 279 lb (126.6 kg)  09/07/15 280 lb (127 kg)  09/07/15 280 lb (127 kg)    Physical Exam  Constitutional: She is oriented to person, place, and time. She appears well-developed.  HENT:  Head: Normocephalic and atraumatic.  Eyes: EOM are normal.  Neck: Normal range of motion. Neck supple. No tracheal deviation present. No thyromegaly present.  Cardiovascular: Normal rate and regular rhythm.   Pulmonary/Chest: Effort normal and breath sounds normal.  Abdominal: Soft. Bowel sounds are normal. There is no tenderness. There is no guarding.  Musculoskeletal: Normal range of motion. She exhibits no edema.  Neurological: She is alert and oriented to  person, place, and time. She has normal reflexes. No cranial nerve deficit. Coordination normal.  Skin: Skin is warm and dry. No rash noted. No erythema. No pallor.  Psychiatric: She has a normal mood and affect. Judgment normal.    Complete Blood Count (Most recent): Lab Results  Component Value Date   WBC 11.9 (H) 11/13/2014   HGB 13.4 11/13/2014   HCT 41.3 11/13/2014   MCV 90.2 11/13/2014   PLT 317 11/13/2014   Chemistry (most recent): Lab Results  Component Value Date   NA 134 (L) 11/12/2014   K 4.5 11/12/2014   CL 99 (L) 11/12/2014   CO2 26 11/12/2014   BUN 13 11/12/2014   CREATININE 1.06 (H) 11/12/2014   Diabetic Labs (most recent): Lab Results  Component Value Date   HGBA1C 9.1 09/03/2015   HGBA1C 9.6 05/28/2015   HGBA1C 9.3 (A) 02/23/2015       Assessment & Plan:   1. Uncontrolled type 2 diabetes mellitus with complication, with long-term current use of insulin (HCC)  Her diabetes is  complicated by obesity status post bariatric surgery and patient remains at a high risk for more acute and chronic complications of diabetes which include CAD, CVA, CKD, retinopathy, and neuropathy. These are all discussed in detail with the patient.  Patient came with persistently elevated glucose profile,   recent A1c Slowly improving to 8.8 %,  overall improving from 10.3 %.    She has a better consistency on Lantus since she started using it in the morning. Glucose logs and insulin administration records pertaining to this visit,  to be scanned into patient's records.  Recent labs reviewed.   - I have re-counseled the patient on diet management and weight loss  by adopting a carbohydrate restricted / protein rich  Diet. -Since her surgery she lost 40 pounds, and has stabilized.  - Suggestion is made for patient to avoid simple carbohydrates   from their diet including Cakes , Desserts, Ice Cream,  Soda (  diet and regular) , Sweet Tea , Candies,  Chips, Cookies, Artificial Sweeteners,   and "Sugar-free" Products .  This will help patient to have stable blood glucose profile and potentially avoid unintended  Weight gain.  - Patient is advised to stick to a routine mealtimes to eat 3 meals  a day and avoid unnecessary snacks ( to snack only to correct hypoglycemia).   - I have approached patient with the following individualized plan to manage diabetes and patient agrees.  She is s/p sleeve GBP, EAG still high at 180-225.  she is [facility].  - Emphasizing compliance on consistency is first timing of insulin and food, she is advised to increase  Lantus  To 60 units   In  morning since she is sleeping and forgetting Lantus at night, increase  Novolog [facility] plus  correction.  -I asked her to strictly monitor, document and bring her log meter in 12 weeks with labs for reeval and dose adjustment.  - she will likely need less insulin going forward. -She did not tolerate metformin, Invokana during prior trials. -She tolerated Glumetza , I advised her to continue 500 mg  Glumetza extended release once a day.  - Patient will be considered for incretin therapy as appropriate next visit. - Patient specific target  for A1c; LDL, HDL, Triglycerides, and  Waist Circumference were discussed in detail.  2) BP/HTN: Controlled. Continue current medications including ACEI/ARB.  3) Lipids/HPL:  continue statins /Vytorin. 4)  Weight/Diet:   I will initiate CDE consult here in this clinic , exercise, and carbohydrates information provided.  5) Chronic Care/Health Maintenance:  -Patient is on ACEI/ARB and Statin medications and encouraged to continue to follow up with Ophthalmology, Podiatrist at least yearly or according to recommendations, and advised to  stay away from smoking. I have recommended yearly flu vaccine and pneumonia vaccination at least every 5 years; moderate intensity exercise for up to 150 minutes weekly; and  sleep for at least 7 hours a day.  I advised patient to maintain close follow up with their PCP for primary care needs.  Patient is asked to bring meter and  blood glucose logs during their next visit.   Follow up plan: Return in about 3 months (around 04/14/2016) for follow up with pre-visit labs, meter, and logs.  Marquis Lunch, MD Phone: [phone]  Fax: [phone]   01/14/2016, 11:38 AM

## 2016-03-29 ENCOUNTER — Other Ambulatory Visit: Payer: Self-pay | Admitting: "Endocrinology

## 2016-04-01 ENCOUNTER — Other Ambulatory Visit: Payer: Self-pay | Admitting: "Endocrinology

## 2016-04-03 ENCOUNTER — Other Ambulatory Visit: Payer: Self-pay | Admitting: Family Medicine

## 2016-04-21 ENCOUNTER — Ambulatory Visit: Payer: Medicare Other | Admitting: "Endocrinology

## 2016-05-09 ENCOUNTER — Encounter: Payer: Self-pay | Admitting: "Endocrinology

## 2016-05-09 ENCOUNTER — Ambulatory Visit (INDEPENDENT_AMBULATORY_CARE_PROVIDER_SITE_OTHER): Payer: Medicare Other | Admitting: "Endocrinology

## 2016-05-09 VITALS — BP 140/70 | HR 70 | Ht 69.0 in | Wt 282.0 lb

## 2016-05-09 DIAGNOSIS — E66813 Obesity, class 3: Secondary | ICD-10-CM

## 2016-05-09 DIAGNOSIS — E782 Mixed hyperlipidemia: Secondary | ICD-10-CM | POA: Diagnosis not present

## 2016-05-09 DIAGNOSIS — E1165 Type 2 diabetes mellitus with hyperglycemia: Secondary | ICD-10-CM

## 2016-05-09 DIAGNOSIS — Z794 Long term (current) use of insulin: Secondary | ICD-10-CM

## 2016-05-09 DIAGNOSIS — E118 Type 2 diabetes mellitus with unspecified complications: Secondary | ICD-10-CM | POA: Diagnosis not present

## 2016-05-09 DIAGNOSIS — I10 Essential (primary) hypertension: Secondary | ICD-10-CM

## 2016-05-09 DIAGNOSIS — IMO0002 Reserved for concepts with insufficient information to code with codable children: Secondary | ICD-10-CM

## 2016-05-09 MED ORDER — INSULIN ASPART 100 UNIT/ML FLEXPEN: 15.0000 [IU] | PEN_INJECTOR | Freq: Three times a day (TID) | SUBCUTANEOUS | 3 refills | Status: DC

## 2016-05-09 MED ORDER — METFORMIN HCL ER (MOD) 1000 MG PO TB24: 1000.0000 mg | ORAL_TABLET | Freq: Every day | ORAL | 2 refills | Status: DC

## 2016-05-09 MED ORDER — INSULIN GLARGINE 100 UNIT/ML SOLOSTAR PEN: 70.0000 [IU] | PEN_INJECTOR | Freq: Every morning | SUBCUTANEOUS | 3 refills | Status: DC

## 2016-05-09 MED ORDER — INSULIN DEGLUDEC 200 UNIT/ML ~~LOC~~ SOPN: 70.0000 [IU] | PEN_INJECTOR | Freq: Every day | SUBCUTANEOUS | 2 refills | Status: DC

## 2016-05-09 NOTE — Progress Notes (Signed)
Subjective:    Patient ID: Emily Leon, female    DOB: 21-Apr-1950,    Past Medical History:  Diagnosis Date  . Arthritis   . Bronchitis    hx of   . Cataracts, bilateral   . Chronic kidney disease   . Cough   . Diabetes mellitus without complication (HCC)   . Hypertension   . Shortness of breath dyspnea    walking distances or climbing stairs  . Sleep apnea    uses CPAP  . Tingling    toes bilat   . Urinary frequency    Past Surgical History:  Procedure Laterality Date  . ABDOMINAL HYSTERECTOMY    . BREATH TEK H PYLORI N/A 08/26/2014   Procedure: BREATH TEK H PYLORI;  Surgeon: Gaynelle AduEric Wilson, MD;  [facility];  Service: General;  Laterality: N/A;  . KNEE SURGERY Left 2009  . LAPAROSCOPIC GASTRIC SLEEVE RESECTION N/A 11/11/2014   Procedure: LAPAROSCOPIC GASTRIC SLEEVE RESECTION;  Surgeon: Gaynelle AduEric Wilson, MD;  Location: [facility];  Service: General;  Laterality: N/A;  . UPPER GI ENDOSCOPY  11/11/2014   Procedure: UPPER GI ENDOSCOPY;  Surgeon: Gaynelle AduEric Wilson, MD;  Location: [facility];  Service: General;;   Social History   Social History  . Marital status: Married    Spouse name: N/A  . Number of children: N/A  . Years of education: N/A   Social History Main Topics  . Smoking status: Never Smoker  . Smokeless tobacco: Never Used  . Alcohol use No  . Drug use: No  . Sexual activity: Not Asked   Other Topics Concern  . None   Social History Narrative  . None   Outpatient Encounter Prescriptions as of 05/09/2016  Medication Sig  . amLODipine (NORVASC) 10 MG tablet Take 10 mg by mouth every morning.  Marland Kitchen. atenolol (TENORMIN) 100 MG tablet Take 100 mg by mouth every morning.   . B-D UF III MINI PEN NEEDLES 31G X 5 MM MISC USE 4 TIMES DAILY AS DIRECTED  . ergocalciferol (VITAMIN D2) 50000 UNITS capsule Take 50,000 Units by mouth every Monday.  . ezetimibe-simvastatin (VYTORIN) 10-40 MG tablet TAKE 1 TABLET BY MOUTH AT BEDTIME  . hydrochlorothiazide (HYDRODIURIL)  25 MG tablet Take 12.5 mg by mouth every morning.   . insulin aspart (NOVOLOG FLEXPEN) 100 UNIT/ML FlexPen Inject 15-21 Units into the skin 3 (three) times daily with meals.  . Insulin Degludec (TRESIBA FLEXTOUCH) 200 UNIT/ML SOPN Inject 70 Units into the skin daily with breakfast.  . loratadine (CLARITIN) 10 MG tablet Take 10 mg by mouth daily.  . metFORMIN (GLUMETZA) 1000 MG (MOD) 24 hr tablet Take 1 tablet (1,000 mg total) by mouth daily with breakfast.  . ondansetron (ZOFRAN ODT) 4 MG disintegrating tablet Take 1 tablet (4 mg total) by mouth every 8 (eight) hours as needed for nausea or vomiting.  Marland Kitchen. oxyCODONE (ROXICODONE) 5 MG/5ML solution Take 5-10 mLs (5-10 mg total) by mouth every 4 (four) hours as needed for moderate pain or severe pain.  . pantoprazole (PROTONIX) 40 MG tablet Take 1 tablet (40 mg total) by mouth daily.  . valsartan (DIOVAN) 160 MG tablet Take 160 mg by mouth every morning.   . [DISCONTINUED] insulin aspart (NOVOLOG FLEXPEN) 100 UNIT/ML FlexPen Inject 12-18 Units into the skin 3 (three) times daily with meals.  . [DISCONTINUED] Insulin Glargine (LANTUS SOLOSTAR) 100 UNIT/ML Solostar Pen Inject 60 Units into the skin every morning.  . [DISCONTINUED] Insulin Glargine (LANTUS SOLOSTAR)  100 UNIT/ML Solostar Pen Inject 60 Units into the skin at bedtime.  . [DISCONTINUED] Insulin Glargine (LANTUS SOLOSTAR) 100 UNIT/ML Solostar Pen Inject 70 Units into the skin every morning.  . [DISCONTINUED] metFORMIN (GLUCOPHAGE-XR) 500 MG 24 hr tablet TAKE 1 TABLET BY MOUTH EVERY DAY WITH BREAKFAST   No facility-administered encounter medications on file as of 05/09/2016.    ALLERGIES: Allergies  Allergen Reactions  . Penicillins Itching and Rash  . Sulfa Antibiotics Itching and Rash   VACCINATION STATUS:  There is no immunization history on file for this patient.  Diabetes  She presents for her follow-up diabetic visit. She has type 2 diabetes mellitus. Onset time: She was diagnosed  at approximate age of 27 years. Her disease course has been worsening. There are no hypoglycemic associated symptoms. Pertinent negatives for hypoglycemia include no confusion, headaches, pallor or seizures. There are no diabetic associated symptoms. Pertinent negatives for diabetes include no chest pain, no fatigue and no polyphagia. There are no hypoglycemic complications. Symptoms are worsening. There are no diabetic complications. Risk factors for coronary artery disease include dyslipidemia, diabetes mellitus, obesity, hypertension and sedentary lifestyle. Current diabetic treatment includes intensive insulin program. She is compliant with treatment some of the time. Her weight is increasing steadily (After losing 40 pounds, she has stabilized at 282 lbs of weight). She is following a generally unhealthy diet. When asked about meal planning, she reported none. Prior visit with dietitian: She will be scheduled with a dietitian here. She never participates in exercise. Her home blood glucose trend is decreasing steadily. Her breakfast blood glucose range is generally 180-200 mg/dl. Her lunch blood glucose range is generally 180-200 mg/dl. Her dinner blood glucose range is generally 180-200 mg/dl. Her overall blood glucose range is 180-200 mg/dl. An ACE inhibitor/angiotensin II receptor blocker is being taken. Eye exam is current.  Hyperlipidemia  This is a chronic problem. The current episode started more than 1 year ago. The problem is controlled. Pertinent negatives include no chest pain, myalgias or shortness of breath. Risk factors for coronary artery disease include diabetes mellitus, dyslipidemia, hypertension, obesity and a sedentary lifestyle.  Hypertension  This is a chronic problem. The current episode started more than 1 year ago. Pertinent negatives include no chest pain, headaches, palpitations or shortness of breath. Risk factors for coronary artery disease include dyslipidemia, diabetes  mellitus, obesity and sedentary lifestyle. Past treatments include angiotensin blockers.     Review of Systems  Constitutional: Negative for fatigue and unexpected weight change.  HENT: Negative for trouble swallowing and voice change.   Eyes: Negative for visual disturbance.  Respiratory: Negative for cough, shortness of breath and wheezing.   Cardiovascular: Negative for chest pain, palpitations and leg swelling.  Gastrointestinal: Negative for diarrhea, nausea and vomiting.  Endocrine: Negative for cold intolerance, heat intolerance and polyphagia.  Musculoskeletal: Negative for arthralgias and myalgias.  Skin: Negative for color change, pallor, rash and wound.  Neurological: Negative for seizures and headaches.  Psychiatric/Behavioral: Negative for confusion and suicidal ideas.    Objective:    BP 140/70   Pulse 70   Ht 5\' 9"  (1.753 m)   Wt 282 lb (127.9 kg)   BMI 41.64 kg/m   Wt Readings from Last 3 Encounters:  05/09/16 282 lb (127.9 kg)  01/14/16 279 lb (126.6 kg)  09/07/15 280 lb (127 kg)    Physical Exam  Constitutional: She is oriented to person, place, and time. She appears well-developed.  HENT:  Head: Normocephalic and atraumatic.  Eyes: EOM are normal.  Neck: Normal range of motion. Neck supple. No tracheal deviation present. No thyromegaly present.  Cardiovascular: Normal rate and regular rhythm.   Pulmonary/Chest: Effort normal and breath sounds normal.  Abdominal: Soft. Bowel sounds are normal. There is no tenderness. There is no guarding.  Musculoskeletal: Normal range of motion. She exhibits no edema.  Neurological: She is alert and oriented to person, place, and time. She has normal reflexes. No cranial nerve deficit. Coordination normal.  Skin: Skin is warm and dry. No rash noted. No erythema. No pallor.  Psychiatric: She has a normal mood and affect. Judgment normal.    Complete Blood Count (Most recent): Lab Results  Component Value Date   WBC  11.9 (H) 11/13/2014   HGB 13.4 11/13/2014   HCT 41.3 11/13/2014   MCV 90.2 11/13/2014   PLT 317 11/13/2014   Chemistry (most recent): Lab Results  Component Value Date   NA 134 (L) 11/12/2014   K 4.5 11/12/2014   CL 99 (L) 11/12/2014   CO2 26 11/12/2014   BUN 13 11/12/2014   CREATININE 1.06 (H) 11/12/2014   Diabetic Labs (most recent): Lab Results  Component Value Date   HGBA1C 9.1 09/03/2015   HGBA1C 9.6 05/28/2015   HGBA1C 9.3 (A) 02/23/2015      Assessment & Plan:   1. Uncontrolled type 2 diabetes mellitus with complication, with long-term current use of insulin (HCC)  Her diabetes is  complicated by obesity status post bariatric surgery and patient remains at a high risk for more acute and chronic complications of diabetes which include CAD, CVA, CKD, retinopathy, and neuropathy. These are all discussed in detail with the patient.  Patient came with persistently elevated glucose profile,   recent A1c  Is back up to 9.1% after impoving to 8.8 %. -She admits to dietary indiscretion during the holiday season.    She has a better consistency on Lantus since she started using it in the morning. Glucose logs and insulin administration records pertaining to this visit,  to be scanned into patient's records.  Recent labs reviewed.   - I have re-counseled the patient on diet management and weight loss  by adopting a carbohydrate restricted / protein rich  Diet. -Since her surgery she lost 40 pounds, and has stabilized.  - Suggestion is made for patient to avoid simple carbohydrates   from their diet including Cakes , Desserts, Ice Cream,  Soda (  diet and regular) , Sweet Tea , Candies,  Chips, Cookies, Artificial Sweeteners,   and "Sugar-free" Products .  This will help patient to have stable blood glucose profile and potentially avoid unintended  Weight gain.  - Patient is advised to stick to a routine mealtimes to eat 3 meals  a day and avoid unnecessary snacks ( to snack only  to correct hypoglycemia).   - I have approached patient with the following individualized plan to manage diabetes and patient agrees.  She is s/p sleeve GBP, EAG still high at 180-225.  she is encouraged to continue f/u at [facility] s/p Bariatric surgery.  - Emphasizing compliance on consistency is first timing of insulin and food, she is advised to increase  Tresiba to 70 units   In  morning since she is sleeping and forgetting Lantus at night, increase  Novolog to 15 units TIDAC plus correction.  -I asked her to strictly monitor, document and bring her log meter in 12 weeks with labs for reeval and dose adjustment.  -  She did not tolerate metformin, Invokana during prior trials. -She tolerated Glumetza , I advised her to increase to 1000 mg  Glumetza extended release once a day.  - Patient will be considered for incretin therapy as appropriate next visit. - Patient specific target  for A1c; LDL, HDL, Triglycerides, and  Waist Circumference were discussed in detail.  2) BP/HTN: Controlled. Continue current medications including ACEI/ARB. 3) Lipids/HPL:  continue statins /Vytorin. 4)  Weight/Diet:   I will initiate CDE consult here in this clinic , exercise, and carbohydrates information provided.  5) Chronic Care/Health Maintenance:  -Patient is on ACEI/ARB and Statin medications and encouraged to continue to follow up with Ophthalmology, Podiatrist at least yearly or according to recommendations, and advised to  stay away from smoking. I have recommended yearly flu vaccine and pneumonia vaccination at least every 5 years; moderate intensity exercise for up to 150 minutes weekly; and  sleep for at least 7 hours a day.  I advised patient to maintain close follow up with their PCP for primary care needs.  Patient is asked to bring meter and  blood glucose logs during their next visit.   Follow up plan: Return in about 3 months (around 08/07/2016) for meter, and logs.  Marquis Lunch,  MD Phone: [phone]  Fax: [phone]   05/09/2016, 12:04 PM

## 2016-05-09 NOTE — Patient Instructions (Signed)

## 2016-05-18 ENCOUNTER — Other Ambulatory Visit: Payer: Self-pay | Admitting: "Endocrinology

## 2016-05-18 ENCOUNTER — Other Ambulatory Visit: Payer: Self-pay

## 2016-05-18 MED ORDER — INSULIN DEGLUDEC 200 UNIT/ML ~~LOC~~ SOPN: 70.0000 [IU] | PEN_INJECTOR | Freq: Every day | SUBCUTANEOUS | 2 refills | Status: DC

## 2016-05-20 ENCOUNTER — Other Ambulatory Visit: Payer: Self-pay

## 2016-05-20 MED ORDER — INSULIN DEGLUDEC 200 UNIT/ML ~~LOC~~ SOPN: 70.0000 [IU] | PEN_INJECTOR | Freq: Every day | SUBCUTANEOUS | 2 refills | Status: DC

## 2016-06-23 ENCOUNTER — Other Ambulatory Visit: Payer: Self-pay | Admitting: "Endocrinology

## 2016-07-07 IMAGING — CR DG CHEST 2V
2 series · 2 of 2 positions shown · non-contrast
Comparison: None

CLINICAL DATA: Morbid obesity, for bariatric screening

EXAM:
CHEST  2 VIEW

[view not recorded (1 of 2)]
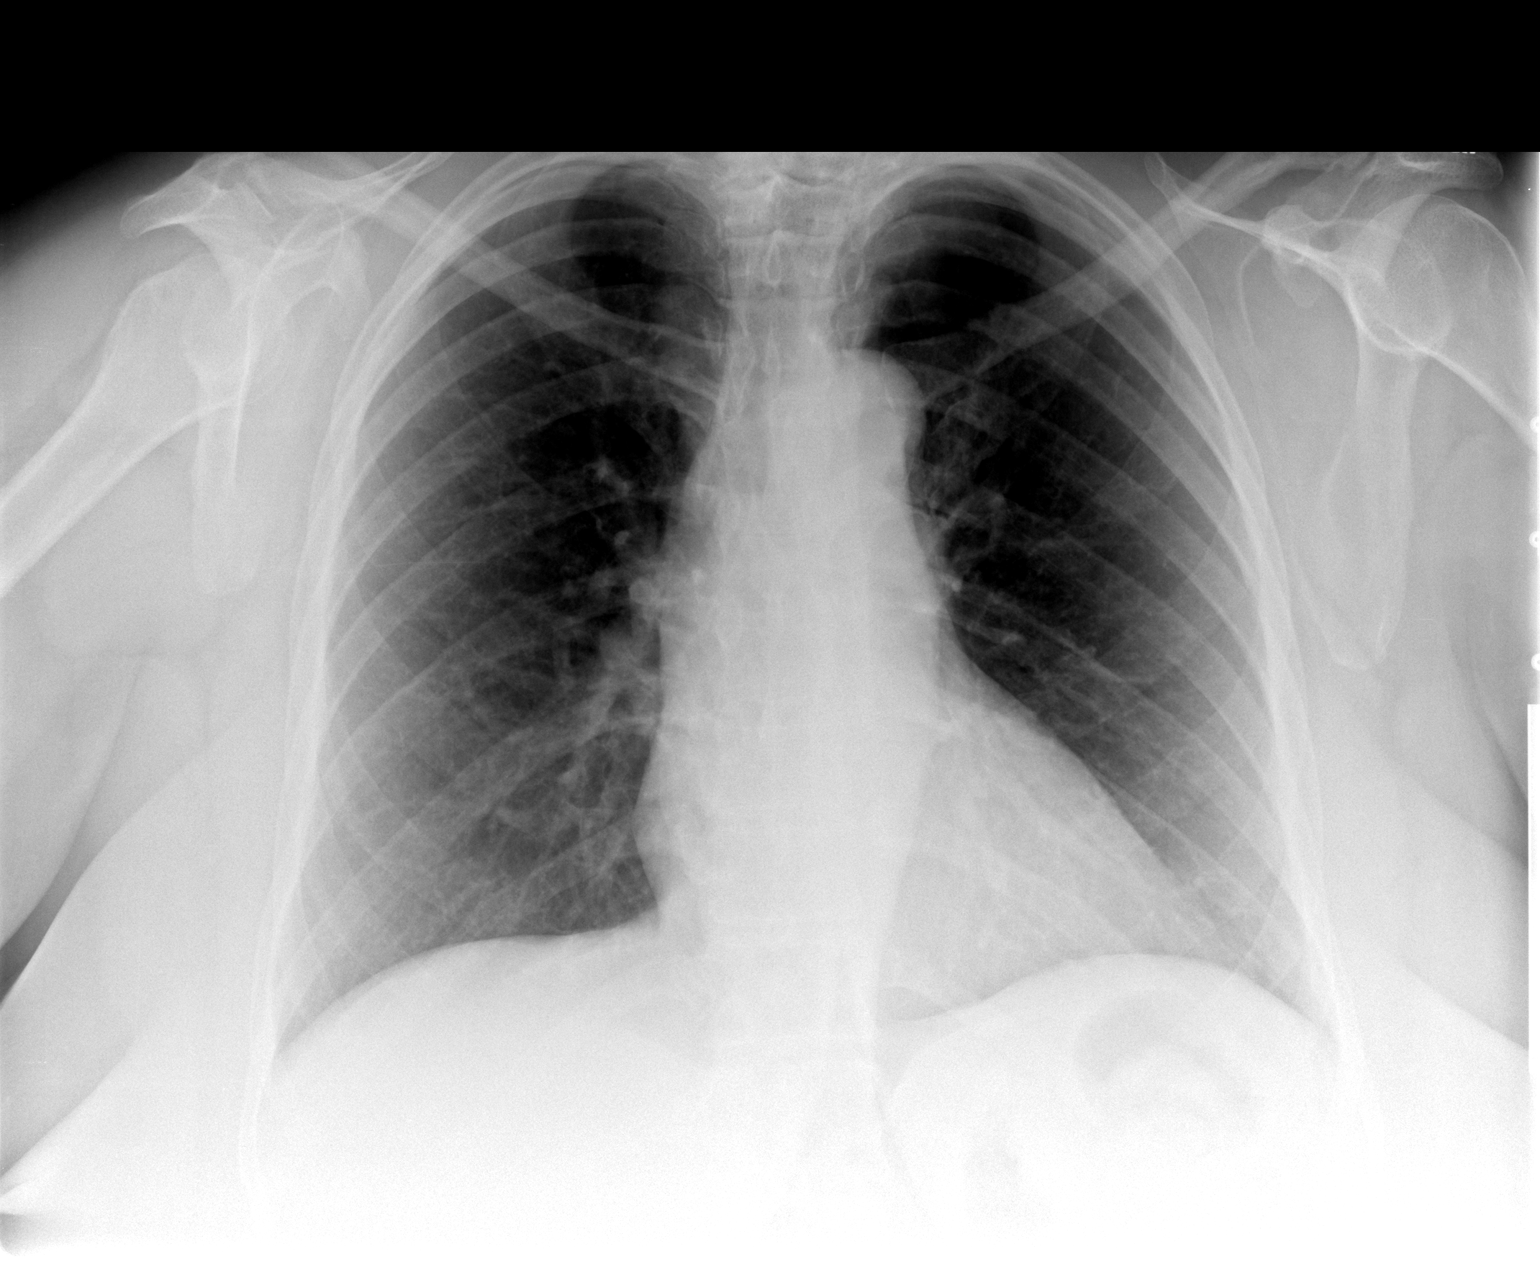

[view not recorded (2 of 2)]
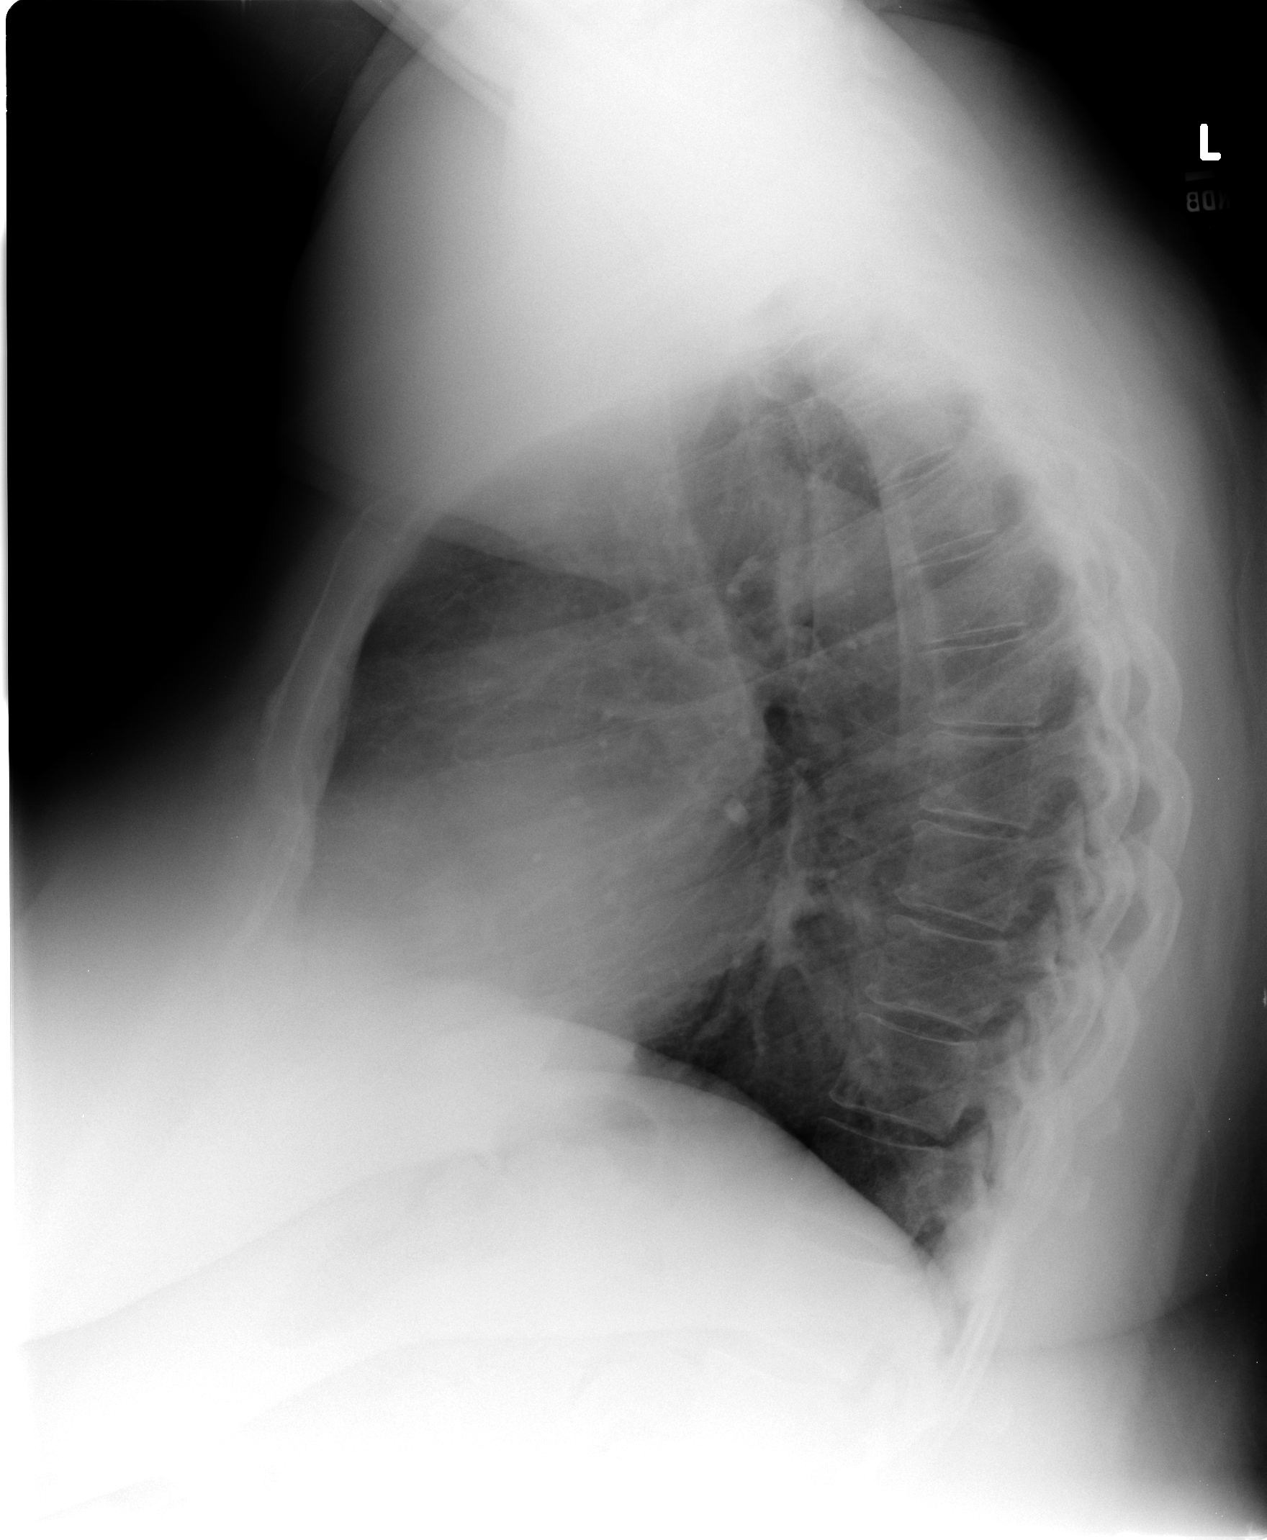

[2 of 2 positions shown; findings below may reference images not displayed]

FINDINGS: No active infiltrate or effusion is seen. The lungs are slightly
hyperaerated. There is mild cardiomegaly present. No bony
abnormality is seen.
IMPRESSION: Mild cardiomegaly.  No active lung disease.

## 2016-07-22 LAB — BASIC METABOLIC PANEL
BUN: 13 (ref 4–21)
CREATININE: 1.1 (ref <=1.1)

## 2016-07-22 LAB — HEMOGLOBIN A1C: HEMOGLOBIN A1C: 8.9

## 2016-08-09 ENCOUNTER — Ambulatory Visit: Payer: Medicare Other | Admitting: "Endocrinology

## 2016-08-09 ENCOUNTER — Encounter: Payer: Self-pay | Admitting: "Endocrinology

## 2016-09-21 ENCOUNTER — Encounter: Payer: Self-pay | Admitting: "Endocrinology

## 2016-09-21 ENCOUNTER — Ambulatory Visit (INDEPENDENT_AMBULATORY_CARE_PROVIDER_SITE_OTHER): Payer: Medicare Other | Admitting: "Endocrinology

## 2016-09-21 VITALS — BP 137/76 | HR 71 | Ht 69.0 in | Wt 286.0 lb

## 2016-09-21 DIAGNOSIS — Z794 Long term (current) use of insulin: Secondary | ICD-10-CM | POA: Diagnosis not present

## 2016-09-21 DIAGNOSIS — E118 Type 2 diabetes mellitus with unspecified complications: Secondary | ICD-10-CM

## 2016-09-21 DIAGNOSIS — IMO0002 Reserved for concepts with insufficient information to code with codable children: Secondary | ICD-10-CM

## 2016-09-21 DIAGNOSIS — I10 Essential (primary) hypertension: Secondary | ICD-10-CM | POA: Diagnosis not present

## 2016-09-21 DIAGNOSIS — E1165 Type 2 diabetes mellitus with hyperglycemia: Secondary | ICD-10-CM

## 2016-09-21 DIAGNOSIS — E782 Mixed hyperlipidemia: Secondary | ICD-10-CM | POA: Diagnosis not present

## 2016-09-21 MED ORDER — DULAGLUTIDE 0.75 MG/0.5ML ~~LOC~~ SOAJ: 0.7500 mg | SUBCUTANEOUS | 2 refills | Status: DC

## 2016-09-21 NOTE — Progress Notes (Signed)
Subjective:    Patient ID: Emily Leon, female    DOB: July 18, 1950,    Past Medical History:  Diagnosis Date  . Arthritis   . Bronchitis    hx of   . Cataracts, bilateral   . Chronic kidney disease   . Cough   . Diabetes mellitus without complication (HCC)   . Hypertension   . Shortness of breath dyspnea    walking distances or climbing stairs  . Sleep apnea    uses CPAP  . Tingling    toes bilat   . Urinary frequency    Past Surgical History:  Procedure Laterality Date  . ABDOMINAL HYSTERECTOMY    . BREATH TEK H PYLORI N/A 08/26/2014   Procedure: BREATH TEK H PYLORI;  Surgeon: Gaynelle Adu, MD;  Location: [facility];  Service: General;  Laterality: N/A;  . KNEE SURGERY Left 2009  . LAPAROSCOPIC GASTRIC SLEEVE RESECTION N/A 11/11/2014   Procedure: LAPAROSCOPIC GASTRIC SLEEVE RESECTION;  Surgeon: Gaynelle Adu, MD;  Location: [facility];  Service: General;  Laterality: N/A;  . UPPER GI ENDOSCOPY  11/11/2014   Procedure: UPPER GI ENDOSCOPY;  Surgeon: Gaynelle Adu, MD;  Location: [facility];  Service: General;;   Social History   Social History  . Marital status: Married    Spouse name: N/A  . Number of children: N/A  . Years of education: N/A   Social History Main Topics  . Smoking status: Never Smoker  . Smokeless tobacco: Never Used  . Alcohol use No  . Drug use: No  . Sexual activity: Not Asked   Other Topics Concern  . None   Social History Narrative  . None   Outpatient Encounter Prescriptions as of 09/21/2016  Medication Sig  . amLODipine (NORVASC) 10 MG tablet Take 10 mg by mouth every morning.  Marland Kitchen atenolol (TENORMIN) 100 MG tablet Take 100 mg by mouth every morning.   . B-D UF III MINI PEN NEEDLES 31G X 5 MM MISC USE 4 TIMES DAILY AS DIRECTED  . Dulaglutide (TRULICITY) 0.75 MG/0.5ML SOPN Inject 0.75 mg into the skin once a week.  . ergocalciferol (VITAMIN D2) 50000 UNITS capsule Take 50,000 Units by mouth every Monday.  . ezetimibe-simvastatin (VYTORIN)  10-40 MG tablet TAKE 1 TABLET BY MOUTH AT BEDTIME  . hydrochlorothiazide (HYDRODIURIL) 25 MG tablet Take 12.5 mg by mouth every morning.   . insulin aspart (NOVOLOG FLEXPEN) 100 UNIT/ML FlexPen Inject 15-21 Units into the skin 3 (three) times daily with meals.  . Insulin Degludec (TRESIBA FLEXTOUCH) 200 UNIT/ML SOPN Inject 70 Units into the skin daily with breakfast.  . loratadine (CLARITIN) 10 MG tablet Take 10 mg by mouth daily.  . metFORMIN (GLUMETZA) 1000 MG (MOD) 24 hr tablet Take 1 tablet (1,000 mg total) by mouth daily with breakfast.  . ondansetron (ZOFRAN ODT) 4 MG disintegrating tablet Take 1 tablet (4 mg total) by mouth every 8 (eight) hours as needed for nausea or vomiting.  Marland Kitchen oxyCODONE (ROXICODONE) 5 MG/5ML solution Take 5-10 mLs (5-10 mg total) by mouth every 4 (four) hours as needed for moderate pain or severe pain.  . pantoprazole (PROTONIX) 40 MG tablet Take 1 tablet (40 mg total) by mouth daily.  . valsartan (DIOVAN) 160 MG tablet Take 160 mg by mouth every morning.   . [DISCONTINUED] NOVOLOG FLEXPEN 100 UNIT/ML FlexPen INJECT 30 UNITS SUB-Q 3 TIMES A DAY   No facility-administered encounter medications on file as of 09/21/2016.    ALLERGIES: Allergies  Allergen Reactions  . Penicillins Itching and Rash  . Sulfa Antibiotics Itching and Rash   VACCINATION STATUS:  There is no immunization history on file for this patient.  Diabetes  She presents for her follow-up diabetic visit. She has type 2 diabetes mellitus. Onset time: She was diagnosed at approximate age of 27 years. Her disease course has been worsening. There are no hypoglycemic associated symptoms. Pertinent negatives for hypoglycemia include no confusion, headaches, pallor or seizures. There are no diabetic associated symptoms. Pertinent negatives for diabetes include no chest pain, no fatigue and no polyphagia. There are no hypoglycemic complications. Symptoms are worsening. There are no diabetic complications. Risk  factors for coronary artery disease include dyslipidemia, diabetes mellitus, obesity, hypertension and sedentary lifestyle. Current diabetic treatment includes intensive insulin program. She is compliant with treatment some of the time. Her weight is increasing steadily (After losing 40 pounds, she has stabilized at 282 lbs of weight). She is following a generally unhealthy diet. When asked about meal planning, she reported none. Prior visit with dietitian: She will be scheduled with a dietitian here. She never participates in exercise. Her home blood glucose trend is decreasing steadily. Her breakfast blood glucose range is generally 180-200 mg/dl. Her lunch blood glucose range is generally 180-200 mg/dl. Her dinner blood glucose range is generally 180-200 mg/dl. Her overall blood glucose range is 180-200 mg/dl. An ACE inhibitor/angiotensin II receptor blocker is being taken. Eye exam is current.  Hyperlipidemia  This is a chronic problem. The current episode started more than 1 year ago. The problem is controlled. Pertinent negatives include no chest pain, myalgias or shortness of breath. Risk factors for coronary artery disease include diabetes mellitus, dyslipidemia, hypertension, obesity and a sedentary lifestyle.  Hypertension  This is a chronic problem. The current episode started more than 1 year ago. Pertinent negatives include no chest pain, headaches, palpitations or shortness of breath. Risk factors for coronary artery disease include dyslipidemia, diabetes mellitus, obesity and sedentary lifestyle. Past treatments include angiotensin blockers.     Review of Systems  Constitutional: Negative for fatigue and unexpected weight change.  HENT: Negative for trouble swallowing and voice change.   Eyes: Negative for visual disturbance.  Respiratory: Negative for cough, shortness of breath and wheezing.   Cardiovascular: Negative for chest pain, palpitations and leg swelling.  Gastrointestinal:  Negative for diarrhea, nausea and vomiting.  Endocrine: Negative for cold intolerance, heat intolerance and polyphagia.  Musculoskeletal: Negative for arthralgias and myalgias.  Skin: Negative for color change, pallor, rash and wound.  Neurological: Negative for seizures and headaches.  Psychiatric/Behavioral: Negative for confusion and suicidal ideas.    Objective:    BP 137/76   Pulse 71   Ht 5\' 9"  (1.753 m)   Wt 286 lb (129.7 kg)   BMI 42.23 kg/m   Wt Readings from Last 3 Encounters:  09/21/16 286 lb (129.7 kg)  05/09/16 282 lb (127.9 kg)  01/14/16 279 lb (126.6 kg)    Physical Exam  Constitutional: She is oriented to person, place, and time. She appears well-developed.  HENT:  Head: Normocephalic and atraumatic.  Eyes: EOM are normal.  Neck: Normal range of motion. Neck supple. No tracheal deviation present. No thyromegaly present.  Cardiovascular: Normal rate and regular rhythm.   Pulmonary/Chest: Effort normal and breath sounds normal.  Abdominal: Soft. Bowel sounds are normal. There is no tenderness. There is no guarding.  Musculoskeletal: Normal range of motion. She exhibits no edema.  Neurological: She is alert and oriented  to person, place, and time. She has normal reflexes. No cranial nerve deficit. Coordination normal.  Skin: Skin is warm and dry. No rash noted. No erythema. No pallor.  Psychiatric: She has a normal mood and affect. Judgment normal.    Complete Blood Count (Most recent): Lab Results  Component Value Date   WBC 11.9 (H) 11/13/2014   HGB 13.4 11/13/2014   HCT 41.3 11/13/2014   MCV 90.2 11/13/2014   PLT 317 11/13/2014   Chemistry (most recent): Lab Results  Component Value Date   NA 134 (L) 11/12/2014   K 4.5 11/12/2014   CL 99 (L) 11/12/2014   CO2 26 11/12/2014   BUN 13 11/12/2014   CREATININE 1.06 (H) 11/12/2014   Diabetic Labs (most recent): Lab Results  Component Value Date   HGBA1C 9.1 09/03/2015   HGBA1C 9.6 05/28/2015    HGBA1C 9.3 (A) 02/23/2015      Assessment & Plan:   1. Uncontrolled type 2 diabetes mellitus with complication, with long-term current use of insulin (HCC)  Her diabetes is  complicated by obesity status post  Sleeve bariatric surgery and patient remains at a high risk for more acute and chronic complications of diabetes which include CAD, CVA, CKD, retinopathy, and neuropathy. These are all discussed in detail with the patient.  Patient came with persistently elevated glucose profile,   recent A1c  Of 8.9% slightly improving from 9.1% . -She admits to dietary indiscretion during the holiday season.    She has a better consistency on Lantus since she started using it in the morning. Glucose logs and insulin administration records pertaining to this visit,  to be scanned into patient's records.  Recent labs reviewed.   - I have re-counseled the patient on diet management and weight loss  by adopting a carbohydrate restricted / protein rich  Diet. -Since her surgery she lost 40 pounds, and has stabilized.  - Suggestion is made for patient to avoid simple carbohydrates   from her diet including Cakes , Desserts, Ice Cream,  Soda (  diet and regular) , Sweet Tea , Candies,  Chips, Cookies, Artificial Sweeteners,   and "Sugar-free" Products .  This will help patient to have stable blood glucose profile and potentially avoid unintended  Weight gain.  - Patient is advised to stick to a routine mealtimes to eat 3 meals  a day and avoid unnecessary snacks ( to snack only to correct hypoglycemia).   - I have approached patient with the following individualized plan to manage diabetes and patient agrees.  She is s/p sleeve GBP, EAG still high at  245  she is encouraged to continue f/u at [facility] s/p Bariatric surgery.  -  She missed at least a third of her insulin opportunities.  - Emphasizing compliance on consistency in  timing of   meals and insulin ,  she is advised to continue   Tresiba  70 units   In  morning since she is sleeping and forgetting Lantus at night, and  Novolog  15 units TIDAC plus correction.  -I asked her to strictly monitor, document and bring her log meter in 12 weeks with labs for reeval and dose adjustment.  -She did not tolerate metformin, Invokana during prior trials. -She tolerated Glumetza , I advised her to continue  1000 mg  Glumetza extended release once a day.  - I have discussed and added Trulicity 0.75 mg subcutaneously weekly.  - Patient specific target  for A1c; LDL, HDL,  Triglycerides, and  Waist Circumference were discussed in detail.  2) BP/HTN: Controlled. Continue current medications including ACEI/ARB. 3) Lipids/HPL:  continue statins /Vytorin. 4)  Weight/Diet:    CDE consult was initiated but she did not follow through  , exercise, and carbohydrates information provided.  5) Chronic Care/Health Maintenance:  -Patient is on ACEI/ARB and Statin medications and encouraged to continue to follow up with Ophthalmology, Podiatrist at least yearly or according to recommendations, and advised to  stay away from smoking. I have recommended yearly flu vaccine and pneumonia vaccination at least every 5 years; moderate intensity exercise for up to 150 minutes weekly; and  sleep for at least 7 hours a day.  I advised patient to maintain close follow up with their PCP for primary care needs.  Patient is asked to bring meter and  blood glucose logs during her next visit.   Follow up plan: Return in about 3 months (around 12/22/2016) for meter, and logs.  Marquis Lunch, MD Phone: [phone]  Fax: [phone]   09/21/2016, 11:37 AM

## 2016-09-21 NOTE — Patient Instructions (Signed)
Advice for weight management -For most of us the best way to lose weight is by diet management. Generally speaking, diet management means restricting carbohydrate consumption to minimum possible (and to unprocessed or minimally processed complex starch) and increasing protein intake (animal or plant source), fruits, and vegetables.  -Sticking to a routine mealtime to eat 3 meals a day and avoiding unnecessary snacks is shown to have a big role in weight control.  -It is better to avoid simple carbohydrates including: Cakes, Desserts, Ice Cream, Soda (diet and regular), Sweet Tea, Candies, Chips, Cookies, Artificial Sweeteners, and "Sugar-free" Products.   -Exercise: 30 minutes a day 3-4 days a week, or 150 minutes a week. Combine stretch, strength, and aerobic activities. You may seek evaluation by your heart doctor prior to initiating exercise if you have high risk for heart disease.  -If you are interested, we can schedule a visit with Emily Leon, RDN, CDE for individualized nutrition education.  

## 2016-10-13 ENCOUNTER — Other Ambulatory Visit: Payer: Self-pay | Admitting: "Endocrinology

## 2016-10-19 ENCOUNTER — Other Ambulatory Visit: Payer: Self-pay | Admitting: Family Medicine

## 2016-12-16 HISTORY — DX: Morbid (severe) obesity due to excess calories: E66.01

## 2016-12-20 ENCOUNTER — Encounter: Payer: Self-pay | Admitting: "Endocrinology

## 2016-12-20 LAB — BASIC METABOLIC PANEL
BUN: 15 (ref 4–21)
CREATININE: 1 (ref <=1.1)

## 2016-12-20 LAB — HEMOGLOBIN A1C: Hemoglobin A1C: 8.7

## 2016-12-22 ENCOUNTER — Encounter: Payer: Self-pay | Admitting: "Endocrinology

## 2016-12-22 ENCOUNTER — Ambulatory Visit (INDEPENDENT_AMBULATORY_CARE_PROVIDER_SITE_OTHER): Payer: Medicare Other | Admitting: "Endocrinology

## 2016-12-22 VITALS — BP 133/87 | HR 80 | Ht 69.0 in | Wt 285.0 lb

## 2016-12-22 DIAGNOSIS — E1165 Type 2 diabetes mellitus with hyperglycemia: Secondary | ICD-10-CM

## 2016-12-22 DIAGNOSIS — E118 Type 2 diabetes mellitus with unspecified complications: Secondary | ICD-10-CM | POA: Diagnosis not present

## 2016-12-22 DIAGNOSIS — I10 Essential (primary) hypertension: Secondary | ICD-10-CM

## 2016-12-22 DIAGNOSIS — E782 Mixed hyperlipidemia: Secondary | ICD-10-CM | POA: Diagnosis not present

## 2016-12-22 DIAGNOSIS — IMO0002 Reserved for concepts with insufficient information to code with codable children: Secondary | ICD-10-CM

## 2016-12-22 DIAGNOSIS — Z794 Long term (current) use of insulin: Secondary | ICD-10-CM

## 2016-12-22 MED ORDER — FREESTYLE LIBRE READER DEVI: 1.0000 | Freq: Once | 0 refills | Status: AC

## 2016-12-22 MED ORDER — INSULIN ASPART 100 UNIT/ML FLEXPEN: 18.0000 [IU] | PEN_INJECTOR | Freq: Three times a day (TID) | SUBCUTANEOUS | 3 refills | Status: DC

## 2016-12-22 MED ORDER — FREESTYLE LIBRE SENSOR SYSTEM MISC: 2 refills | Status: DC

## 2016-12-22 NOTE — Progress Notes (Signed)
Subjective:    Patient ID: Emily MillardBarbaretta S Carmon, female    DOB: 1950/06/16,    Past Medical History:  Diagnosis Date  . Arthritis   . Bronchitis    hx of   . Cataracts, bilateral   . Chronic kidney disease   . Cough   . Diabetes mellitus without complication (HCC)   . Hypertension   . Shortness of breath dyspnea    walking distances or climbing stairs  . Sleep apnea    uses CPAP  . Tingling    toes bilat   . Urinary frequency    Past Surgical History:  Procedure Laterality Date  . ABDOMINAL HYSTERECTOMY    . BREATH TEK H PYLORI N/A 08/26/2014   Procedure: BREATH TEK H PYLORI;  Surgeon: Gaynelle AduEric Wilson, MD;  Location: [facility];  Service: General;  Laterality: N/A;  . KNEE SURGERY Left 2009  . LAPAROSCOPIC GASTRIC SLEEVE RESECTION N/A 11/11/2014   Procedure: LAPAROSCOPIC GASTRIC SLEEVE RESECTION;  Surgeon: Gaynelle AduEric Wilson, MD;  Location: [facility];  Service: General;  Laterality: N/A;  . UPPER GI ENDOSCOPY  11/11/2014   Procedure: UPPER GI ENDOSCOPY;  Surgeon: Gaynelle AduEric Wilson, MD;  Location: [facility];  Service: General;;   Social History   Social History  . Marital status: Married    Spouse name: N/A  . Number of children: N/A  . Years of education: N/A   Social History Main Topics  . Smoking status: Never Smoker  . Smokeless tobacco: Never Used  . Alcohol use No  . Drug use: No  . Sexual activity: Not Asked   Other Topics Concern  . None   Social History Narrative  . None   Outpatient Encounter Prescriptions as of 12/22/2016  Medication Sig  . amLODipine (NORVASC) 10 MG tablet Take 10 mg by mouth every morning.  Marland Kitchen. atenolol (TENORMIN) 100 MG tablet Take 100 mg by mouth every morning.   . B-D UF III MINI PEN NEEDLES 31G X 5 MM MISC USE 4 TIMES DAILY AS DIRECTED  . Continuous Blood Gluc Receiver (FREESTYLE LIBRE READER) DEVI 1 Piece by Does not apply route once.  . Continuous Blood Gluc Sensor (FREESTYLE LIBRE SENSOR SYSTEM) MISC Use one sensor every 10 days.  .  ergocalciferol (VITAMIN D2) 50000 UNITS capsule Take 50,000 Units by mouth every Monday.  . ezetimibe-simvastatin (VYTORIN) 10-40 MG tablet TAKE 1 TABLET BY MOUTH AT BEDTIME  . hydrochlorothiazide (HYDRODIURIL) 25 MG tablet Take 12.5 mg by mouth every morning.   . insulin aspart (NOVOLOG FLEXPEN) 100 UNIT/ML FlexPen Inject 18-24 Units into the skin 3 (three) times daily with meals.  . Insulin Degludec (TRESIBA FLEXTOUCH) 200 UNIT/ML SOPN Inject 70 Units into the skin daily with breakfast.  . loratadine (CLARITIN) 10 MG tablet Take 10 mg by mouth daily.  . metFORMIN (GLUMETZA) 1000 MG (MOD) 24 hr tablet Take 1 tablet (1,000 mg total) by mouth daily with breakfast.  . ondansetron (ZOFRAN ODT) 4 MG disintegrating tablet Take 1 tablet (4 mg total) by mouth every 8 (eight) hours as needed for nausea or vomiting.  Marland Kitchen. oxyCODONE (ROXICODONE) 5 MG/5ML solution Take 5-10 mLs (5-10 mg total) by mouth every 4 (four) hours as needed for moderate pain or severe pain.  . pantoprazole (PROTONIX) 40 MG tablet Take 1 tablet (40 mg total) by mouth daily.  . valsartan (DIOVAN) 160 MG tablet Take 160 mg by mouth every morning.   . [DISCONTINUED] Dulaglutide (TRULICITY) 0.75 MG/0.5ML SOPN Inject 0.75 mg into the  skin once a week.  . [DISCONTINUED] insulin aspart (NOVOLOG FLEXPEN) 100 UNIT/ML FlexPen Inject 15-21 Units into the skin 3 (three) times daily with meals.   No facility-administered encounter medications on file as of 12/22/2016.    ALLERGIES: Allergies  Allergen Reactions  . Penicillins Itching and Rash  . Sulfa Antibiotics Itching and Rash   VACCINATION STATUS:  There is no immunization history on file for this patient.  Diabetes  She presents for her follow-up diabetic visit. She has type 2 diabetes mellitus. Onset time: She was diagnosed at approximate age of 27 years. Her disease course has been worsening. There are no hypoglycemic associated symptoms. Pertinent negatives for hypoglycemia include no  confusion, headaches, pallor or seizures. There are no diabetic associated symptoms. Pertinent negatives for diabetes include no chest pain, no fatigue and no polyphagia. There are no hypoglycemic complications. Symptoms are worsening. There are no diabetic complications. Risk factors for coronary artery disease include dyslipidemia, diabetes mellitus, obesity, hypertension and sedentary lifestyle. Current diabetic treatment includes intensive insulin program. She is compliant with treatment some of the time. Her weight is increasing steadily (After losing 40 pounds, she has stabilized at 282 lbs of weight). She is following a generally unhealthy diet. When asked about meal planning, she reported none. Prior visit with dietitian: She will be scheduled with a dietitian here. She never participates in exercise. Her home blood glucose trend is decreasing steadily. Her breakfast blood glucose range is generally 180-200 mg/dl. Her lunch blood glucose range is generally 180-200 mg/dl. Her dinner blood glucose range is generally 180-200 mg/dl. Her overall blood glucose range is 180-200 mg/dl. An ACE inhibitor/angiotensin II receptor blocker is being taken. Eye exam is current.  Hyperlipidemia  This is a chronic problem. The current episode started more than 1 year ago. The problem is controlled. Pertinent negatives include no chest pain, myalgias or shortness of breath. Risk factors for coronary artery disease include diabetes mellitus, dyslipidemia, hypertension, obesity and a sedentary lifestyle.  Hypertension  This is a chronic problem. The current episode started more than 1 year ago. Pertinent negatives include no chest pain, headaches, palpitations or shortness of breath. Risk factors for coronary artery disease include dyslipidemia, diabetes mellitus, obesity and sedentary lifestyle. Past treatments include angiotensin blockers.     Review of Systems  Constitutional: Negative for fatigue and unexpected  weight change.  HENT: Negative for trouble swallowing and voice change.   Eyes: Negative for visual disturbance.  Respiratory: Negative for cough, shortness of breath and wheezing.   Cardiovascular: Negative for chest pain, palpitations and leg swelling.  Gastrointestinal: Negative for diarrhea, nausea and vomiting.  Endocrine: Negative for cold intolerance, heat intolerance and polyphagia.  Musculoskeletal: Negative for arthralgias and myalgias.  Skin: Negative for color change, pallor, rash and wound.  Neurological: Negative for seizures and headaches.  Psychiatric/Behavioral: Negative for confusion and suicidal ideas.    Objective:    BP 133/87   Pulse 80   Ht  (1.753 m)   Wt 285 lb (129.3 kg)   BMI 42.09 kg/m   Wt Readings from Last 3 Encounters:  12/22/16 285 lb (129.3 kg)  09/21/16 286 lb (129.7 kg)  05/09/16 282 lb (127.9 kg)    Physical Exam  Constitutional: She is oriented to person, place, and time. She appears well-developed.  HENT:  Head: Normocephalic and atraumatic.  Eyes: EOM are normal.  Neck: Normal range of motion. Neck supple. No tracheal deviation present. No thyromegaly present.  Cardiovascular: Normal rate and  regular rhythm.   Pulmonary/Chest: Effort normal and breath sounds normal.  Abdominal: Soft. Bowel sounds are normal. There is no tenderness. There is no guarding.  Musculoskeletal: Normal range of motion. She exhibits no edema.  Neurological: She is alert and oriented to person, place, and time. She has normal reflexes. No cranial nerve deficit. Coordination normal.  Skin: Skin is warm and dry. No rash noted. No erythema. No pallor.  Psychiatric: She has a normal mood and affect. Judgment normal.    Complete Blood Count (Most recent): Lab Results  Component Value Date   WBC 11.9 (H) 11/13/2014   HGB 13.4 11/13/2014   HCT 41.3 11/13/2014   MCV 90.2 11/13/2014   PLT 317 11/13/2014   Chemistry (most recent): Lab Results  Component  Value Date   NA 134 (L) 11/12/2014   K 4.5 11/12/2014   CL 99 (L) 11/12/2014   CO2 26 11/12/2014   BUN 15 12/20/2016   CREATININE 1.0 12/20/2016   Diabetic Labs (most recent): Lab Results  Component Value Date   HGBA1C 8.7 12/20/2016   HGBA1C 8.9 07/22/2016   HGBA1C 9.1 09/03/2015      Assessment & Plan:   1. Uncontrolled type 2 diabetes mellitus with complication, with long-term current use of insulin (HCC)  Her diabetes is  complicated by obesity status post  Sleeve bariatric surgery and patient remains at a high risk for more acute and chronic complications of diabetes which include CAD, CVA, CKD, retinopathy, and neuropathy. These are all discussed in detail with the patient.  Patient came with persistently above target glucose profile,   recent A1c  of 8.7% slowly improving from 9.1% . -She admits to dietary indiscretion .    - Glucose logs and insulin administration records pertaining to this visit,  to be scanned into patient's records.  Recent labs reviewed.  - I have re-counseled the patient on diet management and weight loss  by adopting a carbohydrate restricted / protein rich  Diet. -Since her surgery she lost 40 pounds, and has stabilized.  - Suggestion is made for her to avoid simple carbohydrates  from her diet including Cakes, Sweet Desserts, Ice Cream, Soda (diet and regular), Sweet Tea, Candies, Chips, Cookies, Store Bought Juices, Alcohol in Excess of  1-2 drinks a day, Artificial Sweeteners, and "Sugar-free" Products. This will help patient to have stable blood glucose profile and potentially avoid unintended weight gain.   - Patient is advised to stick to a routine mealtimes to eat 3 meals  a day and avoid unnecessary snacks ( to snack only to correct hypoglycemia).   - I have approached patient with the following individualized plan to manage diabetes and patient agrees.  She is s/p sleeve GBP, EAG better at 198, improving from 245  she is encouraged to  continue f/u at [facility] s/p Bariatric surgery.  - Emphasizing compliance on consistency in  timing of   meals and insulin ,  she is advised to continue   Tresiba 70 units   in  morning since she is sleeping and forgetting  at night, and   increase Novolog  To 18 units  3 times a day before meals plus correction.  -I asked her to strictly monitor, document and bring her log and  meter in 12 weeks with labs for reeval and dose adjustment. - She will benefit from continuous glucose monitoring. I have discussed in initiated prescription for the FreeStyle libre device.  -She did not tolerate metformin, Invokana during  prior trials. -She tolerated Glumetza , I advised her to continue  1000 mg  Glumetza extended release once a day.  - She has tolerated Trulicity ,  I discussed and increased the dose to 1.5 mg subcutaneously weekly.  - Patient specific target  for A1c; LDL, HDL, Triglycerides, and  Waist Circumference were discussed in detail.  2) BP/HTN: Controlled. Continue current medications including ACEI/ARB. 3) Lipids/HPL:  continue statins /Vytorin. 4)  Weight/Diet:    CDE consult was initiated but she did not follow through  , exercise, and carbohydrates information provided.  5) Chronic Care/Health Maintenance:  -Patient is on ACEI/ARB and Statin medications and encouraged to continue to follow up with Ophthalmology, Podiatrist at least yearly or according to recommendations, and advised to  stay away from smoking. I have recommended yearly flu vaccine and pneumonia vaccination at least every 5 years; moderate intensity exercise for up to 150 minutes weekly; and  sleep for at least 7 hours a day.  I advised patient to maintain close follow up with her PCP for primary care needs.  Patient is asked to bring meter and  blood glucose logs during her next visit.  - Time spent with the patient: 25 min, of which >50% was spent in reviewing her sugar logs , discussing her hypo- and  hyper-glycemic episodes, reviewing her current and  previous labs and insulin doses and developing a plan to avoid hypo- and hyper-glycemia.   Follow up plan: Return in about 3 months (around 03/23/2017) for follow up with pre-visit labs, meter, and logs.  Marquis Lunch, MD Phone: [phone]  Fax: [phone]  This note was partially dictated with voice recognition software. Similar sounding words can be transcribed inadequately or may not  be corrected upon review.  12/22/2016, 12:54 PM

## 2016-12-22 NOTE — Patient Instructions (Signed)

## 2017-01-04 NOTE — Consult Note (Signed)
Patient to continue testing glucose 4 x daily as instructions.

## 2017-01-24 ENCOUNTER — Other Ambulatory Visit: Payer: Self-pay | Admitting: "Endocrinology

## 2017-01-27 ENCOUNTER — Other Ambulatory Visit: Payer: Self-pay | Admitting: "Endocrinology

## 2017-01-27 MED ORDER — DULAGLUTIDE 1.5 MG/0.5ML ~~LOC~~ SOAJ: 1.5000 mg | SUBCUTANEOUS | 2 refills | Status: DC

## 2017-02-16 ENCOUNTER — Other Ambulatory Visit: Payer: Self-pay | Admitting: "Endocrinology

## 2017-02-20 ENCOUNTER — Telehealth: Payer: Self-pay | Admitting: "Endocrinology

## 2017-02-20 NOTE — Telephone Encounter (Signed)
Emily Leon is asking for a returned call from Dr. Dennie BibleNidas nurse, she has medication questions, please advise?

## 2017-02-20 NOTE — Telephone Encounter (Signed)
Left message for pt to call back  °

## 2017-02-21 ENCOUNTER — Other Ambulatory Visit: Payer: Self-pay

## 2017-02-21 MED ORDER — DULAGLUTIDE 1.5 MG/0.5ML ~~LOC~~ SOAJ: 1.5000 mg | SUBCUTANEOUS | 2 refills | Status: DC

## 2017-02-21 NOTE — Telephone Encounter (Signed)
Trulicity refill sent to [facility]

## 2017-03-23 LAB — LIPID PANEL
Cholesterol: 187 (ref 0–200)
HDL: 36 (ref 35–70)
LDL CALC: 119
Triglycerides: 162 — AB (ref 40–160)

## 2017-03-23 LAB — BASIC METABOLIC PANEL
BUN: 12 (ref 4–21)
Creatinine: 1.1 (ref <=1.1)

## 2017-03-23 LAB — HEMOGLOBIN A1C: Hemoglobin A1C: 9.2

## 2017-03-23 LAB — VITAMIN D 25 HYDROXY (VIT D DEFICIENCY, FRACTURES): Vit D, 25-Hydroxy: 25

## 2017-03-23 LAB — TSH: TSH: 1.24 (ref <=5.90)

## 2017-03-30 ENCOUNTER — Encounter: Payer: Self-pay | Admitting: "Endocrinology

## 2017-03-30 ENCOUNTER — Encounter: Payer: Self-pay | Admitting: Nutrition

## 2017-03-30 ENCOUNTER — Ambulatory Visit (INDEPENDENT_AMBULATORY_CARE_PROVIDER_SITE_OTHER): Payer: Medicare Other | Admitting: "Endocrinology

## 2017-03-30 ENCOUNTER — Encounter: Payer: Medicare Other | Attending: "Endocrinology | Admitting: Nutrition

## 2017-03-30 VITALS — Wt 285.0 lb

## 2017-03-30 VITALS — BP 145/89 | HR 87 | Ht 69.0 in | Wt 285.0 lb

## 2017-03-30 DIAGNOSIS — Z713 Dietary counseling and surveillance: Secondary | ICD-10-CM | POA: Diagnosis not present

## 2017-03-30 DIAGNOSIS — I10 Essential (primary) hypertension: Secondary | ICD-10-CM

## 2017-03-30 DIAGNOSIS — Z794 Long term (current) use of insulin: Secondary | ICD-10-CM | POA: Diagnosis not present

## 2017-03-30 DIAGNOSIS — E1165 Type 2 diabetes mellitus with hyperglycemia: Secondary | ICD-10-CM | POA: Insufficient documentation

## 2017-03-30 DIAGNOSIS — E118 Type 2 diabetes mellitus with unspecified complications: Secondary | ICD-10-CM

## 2017-03-30 DIAGNOSIS — IMO0002 Reserved for concepts with insufficient information to code with codable children: Secondary | ICD-10-CM

## 2017-03-30 DIAGNOSIS — E782 Mixed hyperlipidemia: Secondary | ICD-10-CM | POA: Diagnosis not present

## 2017-03-30 MED ORDER — INSULIN DEGLUDEC 200 UNIT/ML ~~LOC~~ SOPN: 80.0000 [IU] | PEN_INJECTOR | Freq: Every day | SUBCUTANEOUS | 2 refills | Status: DC

## 2017-03-30 NOTE — Patient Instructions (Signed)

## 2017-03-30 NOTE — Progress Notes (Signed)
Subjective:    Patient ID: Emily Leon, female    DOB: [birth year],    Past Medical History:  Diagnosis Date  . Arthritis   . Bronchitis    hx of   . Cataracts, bilateral   . Chronic kidney disease   . Cough   . Diabetes mellitus without complication (HCC)   . Hypertension   . Shortness of breath dyspnea    walking distances or climbing stairs  . Sleep apnea    uses CPAP  . Tingling    toes bilat   . Urinary frequency    Past Surgical History:  Procedure Laterality Date  . ABDOMINAL HYSTERECTOMY    . BREATH TEK H PYLORI N/A 08/26/2014   Procedure: BREATH TEK H PYLORI;  Surgeon: Gaynelle AduEric Wilson, MD;  Location: [facility];  Service: General;  Laterality: N/A;  . KNEE SURGERY Left 2009  . LAPAROSCOPIC GASTRIC SLEEVE RESECTION N/A 11/11/2014   Procedure: LAPAROSCOPIC GASTRIC SLEEVE RESECTION;  Surgeon: Gaynelle AduEric Wilson, MD;  Location: [facility];  Service: General;  Laterality: N/A;  . UPPER GI ENDOSCOPY  11/11/2014   Procedure: UPPER GI ENDOSCOPY;  Surgeon: Gaynelle AduEric Wilson, MD;  Location: [facility];  Service: General;;   Social History   Socioeconomic History  . Marital status: Married    Spouse name: None  . Number of children: None  . Years of education: None  . Highest education level: None  Social Needs  . Financial resource strain: None  . Food insecurity - worry: None  . Food insecurity - inability: None  . Transportation needs - medical: None  . Transportation needs - non-medical: None  Occupational History  . None  Tobacco Use  . Smoking status: Never Smoker  . Smokeless tobacco: Never Used  Substance and Sexual Activity  . Alcohol use: No  . Drug use: No  . Sexual activity: None  Other Topics Concern  . None  Social History Narrative  . None   Outpatient Encounter Medications as of 03/30/2017  Medication Sig  . amLODipine (NORVASC) 10 MG tablet Take 10 mg by mouth every morning.  Marland Kitchen. atenolol (TENORMIN) 100 MG tablet Take 100 mg by mouth every morning.   .  B-D UF III MINI PEN NEEDLES 31G X 5 MM MISC USE 4 TIMES DAILY AS DIRECTED  . Continuous Blood Gluc Sensor (FREESTYLE LIBRE SENSOR SYSTEM) MISC Use one sensor every 10 days.  . Dulaglutide (TRULICITY) 1.5 MG/0.5ML SOPN Inject 1.5 mg once a week into the skin.  Marland Kitchen. ergocalciferol (VITAMIN D2) 50000 UNITS capsule Take 50,000 Units by mouth every Monday.  . ezetimibe-simvastatin (VYTORIN) 10-40 MG tablet TAKE 1 TABLET BY MOUTH AT BEDTIME  . hydrochlorothiazide (HYDRODIURIL) 25 MG tablet Take 12.5 mg by mouth every morning.   . insulin aspart (NOVOLOG FLEXPEN) 100 UNIT/ML FlexPen Inject 18-24 Units into the skin 3 (three) times daily with meals.  . Insulin Degludec (TRESIBA FLEXTOUCH) 200 UNIT/ML SOPN Inject 80 Units into the skin daily with breakfast.  . loratadine (CLARITIN) 10 MG tablet Take 10 mg by mouth daily.  . metFORMIN (GLUMETZA) 1000 MG (MOD) 24 hr tablet Take 1 tablet (1,000 mg total) by mouth daily with breakfast.  . NOVOLOG FLEXPEN 100 UNIT/ML FlexPen INJECT 30 UNITS SUB-Q 3 TIMES A DAY  . ondansetron (ZOFRAN ODT) 4 MG disintegrating tablet Take 1 tablet (4 mg total) by mouth every 8 (eight) hours as needed for nausea or vomiting.  Marland Kitchen. oxyCODONE (ROXICODONE) 5 MG/5ML solution Take 5-10 mLs (  5-10 mg total) by mouth every 4 (four) hours as needed for moderate pain or severe pain.  . pantoprazole (PROTONIX) 40 MG tablet Take 1 tablet (40 mg total) by mouth daily.  . valsartan (DIOVAN) 160 MG tablet Take 160 mg by mouth every morning.   . [DISCONTINUED] Insulin Degludec (TRESIBA FLEXTOUCH) 200 UNIT/ML SOPN Inject 70 Units into the skin daily with breakfast.   No facility-administered encounter medications on file as of 03/30/2017.    ALLERGIES: Allergies  Allergen Reactions  . Penicillins Itching and Rash  . Sulfa Antibiotics Itching and Rash   VACCINATION STATUS:  There is no immunization history on file for this patient.  Diabetes  She presents for her follow-up diabetic visit. She  has type 2 diabetes mellitus. Onset time: She was diagnosed at approximate age of 27 years. Her disease course has been worsening. There are no hypoglycemic associated symptoms. Pertinent negatives for hypoglycemia include no confusion, headaches, pallor or seizures. There are no diabetic associated symptoms. Pertinent negatives for diabetes include no chest pain, no fatigue and no polyphagia. There are no hypoglycemic complications. Symptoms are worsening. There are no diabetic complications. Risk factors for coronary artery disease include dyslipidemia, diabetes mellitus, obesity, hypertension and sedentary lifestyle. Current diabetic treatment includes intensive insulin program. She is compliant with treatment some of the time. Her weight is stable (After losing 40 pounds, she has stabilized at 285\ lbs of weight). She is following a generally unhealthy diet. When asked about meal planning, she reported none. Prior visit with dietitian: She will be scheduled with a dietitian here. She never participates in exercise. Her home blood glucose trend is decreasing steadily. Her breakfast blood glucose range is generally 140-180 mg/dl. Her lunch blood glucose range is generally 180-200 mg/dl. Her dinner blood glucose range is generally 180-200 mg/dl. Her overall blood glucose range is 180-200 mg/dl. An ACE inhibitor/angiotensin II receptor blocker is being taken. Eye exam is current.  Hyperlipidemia  This is a chronic problem. The current episode started more than 1 year ago. The problem is controlled. Pertinent negatives include no chest pain, myalgias or shortness of breath. Risk factors for coronary artery disease include diabetes mellitus, dyslipidemia, hypertension, obesity and a sedentary lifestyle.  Hypertension  This is a chronic problem. The current episode started more than 1 year ago. Pertinent negatives include no chest pain, headaches, palpitations or shortness of breath. Risk factors for coronary  artery disease include dyslipidemia, diabetes mellitus, obesity and sedentary lifestyle. Past treatments include angiotensin blockers.    Review of Systems  Constitutional: Negative for fatigue and unexpected weight change.  HENT: Negative for trouble swallowing and voice change.   Eyes: Negative for visual disturbance.  Respiratory: Negative for cough, shortness of breath and wheezing.   Cardiovascular: Negative for chest pain, palpitations and leg swelling.  Gastrointestinal: Negative for diarrhea, nausea and vomiting.  Endocrine: Negative for cold intolerance, heat intolerance and polyphagia.  Musculoskeletal: Negative for arthralgias and myalgias.  Skin: Negative for color change, pallor, rash and wound.  Neurological: Negative for seizures and headaches.  Psychiatric/Behavioral: Negative for confusion and suicidal ideas.    Objective:    BP (!) 145/89   Pulse 87   Ht 5\' 9"  (1.753 m)   Wt 285 lb (129.3 kg)   BMI 42.09 kg/m   Wt Readings from Last 3 Encounters:  03/30/17 285 lb (129.3 kg)  12/22/16 285 lb (129.3 kg)  09/21/16 286 lb (129.7 kg)    Physical Exam  Constitutional: She is oriented  to person, place, and time. She appears well-developed.  HENT:  Head: Normocephalic and atraumatic.  Eyes: EOM are normal.  Neck: Normal range of motion. Neck supple. No tracheal deviation present. No thyromegaly present.  Cardiovascular: Normal rate and regular rhythm.  Pulmonary/Chest: Effort normal and breath sounds normal.  Abdominal: Soft. Bowel sounds are normal. There is no tenderness. There is no guarding.  Musculoskeletal: Normal range of motion. She exhibits no edema.  Neurological: She is alert and oriented to person, place, and time. She has normal reflexes. No cranial nerve deficit. Coordination normal.  Skin: Skin is warm and dry. No rash noted. No erythema. No pallor.  Psychiatric: She has a normal mood and affect. Judgment normal.    Complete Blood Count (Most  recent): Lab Results  Component Value Date   WBC 11.9 (H) 11/13/2014   HGB 13.4 11/13/2014   HCT 41.3 11/13/2014   MCV 90.2 11/13/2014   PLT 317 11/13/2014   Chemistry (most recent): Lab Results  Component Value Date   NA 134 (L) 11/12/2014   K 4.5 11/12/2014   CL 99 (L) 11/12/2014   CO2 26 11/12/2014   BUN 12 03/23/2017   CREATININE 1.1 03/23/2017   Diabetic Labs (most recent): Lab Results  Component Value Date   HGBA1C 9.2 03/23/2017   HGBA1C 8.7 12/20/2016   HGBA1C 8.9 07/22/2016      Assessment & Plan:   1. Uncontrolled type 2 diabetes mellitus with complication, with long-term current use of insulin (HCC)  Her diabetes is  complicated by obesity status post  Sleeve bariatric surgery and patient remains at a high risk for more acute and chronic complications of diabetes which include CAD, CVA, CKD, retinopathy, and neuropathy. These are all discussed in detail with the patient.  Patient came with persistently above target glucose profile,   recent A1c  of  9.2% increasing from 8.7% . -She admits to dietary indiscretion .    - Glucose logs and insulin administration records pertaining to this visit,  to be scanned into patient's records.  Recent labs reviewed.  - I have re-counseled the patient on diet management and weight loss  by adopting a carbohydrate restricted / protein rich  Diet. -Since her surgery she lost 40 pounds, and has stabilized.  -  Suggestion is made for her to avoid simple carbohydrates  from her diet including Cakes, Sweet Desserts / Pastries, Ice Cream, Soda (diet and regular), Sweet Tea, Candies, Chips, Cookies, Store Bought Juices, Alcohol in Excess of  1-2 drinks a day, Artificial Sweeteners, and "Sugar-free" Products. This will help patient to have stable blood glucose profile and potentially avoid unintended weight gain.  - Patient is advised to stick to a routine mealtimes to eat 3 meals  a day and avoid unnecessary snacks ( to snack only to  correct hypoglycemia).   - I have approached patient with the following individualized plan to manage diabetes and patient agrees.  She is s/p sleeve GBP, EAG better at 212, improving from 245  she is encouraged [facility].  - Emphasizing compliance on consistency in  timing of   meals and insulin ,  she is advised to increase   Tresiba to 80 units   in the morning since she is sleeping and forgetting  at night, and   Continue Novolog  18 units  3 times a day before meals plus correction.  - She is now using continuous glucose monitor. I advised  her to document blood glucose at least 4 times a day, before meals and at bedtime.   asked her to strictly monitor, document and bring her log and  meter in 12 weeks with labs for reeval and dose adjustment. - She will benefit from continuous glucose monitoring. I have discussed in initiated prescription for the FreeStyle libre device. - She is warned not to take insulin without documenting blood glucose off of the sensor.  -She tolerated Glumetza , I advised her to continue  1000 mg  Glumetza extended release once a day.  - She has tolerated Trulicity ,  I advised her to continue  1.5 mg subcutaneously weekly.  - Patient specific target  for A1c; LDL, HDL, Triglycerides, and  Waist Circumference were discussed in detail.  2) BP/HTN: Controlled. Continue current medications including ACEI/ARB. 3) Lipids/HPL:  continue statins /Vytorin. 4)  Weight/Diet:    CDE consult was initiated but she did not follow through  , exercise, and carbohydrates information provided.  5) Chronic Care/Health Maintenance:  -Patient is on ACEI/ARB and Statin medications and encouraged to continue to follow up with Ophthalmology, Podiatrist at least yearly or according to recommendations, and advised to  stay away from smoking. I have recommended yearly flu vaccine and pneumonia vaccination at least every 5 years; moderate  intensity exercise for up to 150 minutes weekly; and  sleep for at least 7 hours a day.  I advised patient to maintain close follow up with her PCP for primary care needs.  - Time spent with the patient: 25 min, of which >50% was spent in reviewing her sugar logs , discussing her hypo- and hyper-glycemic episodes, reviewing her current and  previous labs and insulin doses and developing a plan to avoid hypo- and hyper-glycemia.    Follow up plan: Return in about 3 months (around 06/28/2017) for meter, and logs.  Marquis Lunch, MD Phone: [phone]  Fax: [phone]  This note was partially dictated with voice recognition software. Similar sounding words can be transcribed inadequately or may not  be corrected upon review.  03/30/2017, 11:24 AM

## 2017-03-30 NOTE — Progress Notes (Signed)
  Medical Nutrition Therapy:  Appt start time: 1100end time:  1130.Marland Kitchen.   Assessment:  Primary concerns today: Diabetes Type 2.  Has been using Libre. Has been eating more vegetables and baked chicken.   Has been doing silver sneakers 2-3 times per week. No weight loss. BS log shows she is eating more carbs still than she needs, having BS in 200's between lunch and dinner. Denies missing doses of insulin but later thinks she may have missed some based on libre readings.    A1C up from 9.1 to 9.2%. Glutametza 1000 mg, Tresiba 80 units and NOvolog 18 units TID. Needs better commitment to less calories, increase exercise and compliance with medications.  Lab Results  Component Value Date   HGBA1C 9.2 03/23/2017   Preferred Learning Style:  No preference indicated   Learning Readiness:  Ready  Change in progress   MEDICATIONS:   DIETARY INTAKE:  24-hr recall:  B ( AM):  Boilied egg and 2 slices bacon, fruit, 1 slice toast and coffee. Snk ( AM): none  L ( PM):FIsh baked, salad and sweet potato, water D ( PM): Toss salad, baked chicken and fruit, water and 1 slice bread Snk ( PM):   Beverages: water  Usual physical activity: Silver Sneakers and Curves.  Estimated energy needs: 1200-1500 calories 170 g carbohydrates 120 g protein 39 g fat  Progress Towards Goal(s):  In progress.   Nutritional Diagnosis:  NB-1.1 Food and nutrition-related knowledge deficit As related to Diabetes.  As evidenced by A1C 9.2%.    Intervention:  Nutrition and Diabetes education provided on My Plate, CHO counting, meal planning, portion sizes, timing of meals, avoiding snacks between meals unless having a low blood sugar, target ranges for A1C and blood sugars, signs/symptoms and treatment of hyper/hypoglycemia, monitoring blood sugars, taking medications as prescribed, benefits of exercising 30 minutes per day and prevention of complications of DM. Reviewed post gastric bypass surgery  recommendations and importance of compliance with diet and increasing exercise gradually as tolerated for needed weight loss.  Goals 1. Eat 3 balanced meals 2. Take correct amount of NOvolog with meals for improved BS control 3. Do not skip meal time insulin 4. Increase exercise. Cut out high fat foods of bacon, fats, sweets and high calorie foods for needed weight loss Get A1C down to 7%   Teaching Method Utilized:  Visual Auditory Hands on  Handouts given during visit include:  The Plate Method   Meal Plan Card  Diabetes Instructions  Barriers to learning/adherence to lifestyle change: None  Demonstrated degree of understanding via:  Teach Back   Monitoring/Evaluation:  Dietary intake, exercise, meal planning, SBG, and body weight in 3 month(s).

## 2017-03-30 NOTE — Patient Instructions (Signed)
Goals 1. Eat 3 balanced meals 2. Take correct amount of NOvolog with meals for improved BS control 3. Do not skip meal time insulin 4. Increase exercise. Cut out high fat foods of bacon, fats, sweets and high calorie foods for needed weight loss Get A1C down to 7%

## 2017-04-13 IMAGING — CR DG CHEST 2V
2 series · 2 of 2 positions shown · non-contrast
Comparison: 12/19/2013.

CLINICAL DATA: Gastric bypass surgery .  History of obesity.

EXAM:
CHEST  2 VIEW

[w chest pa]
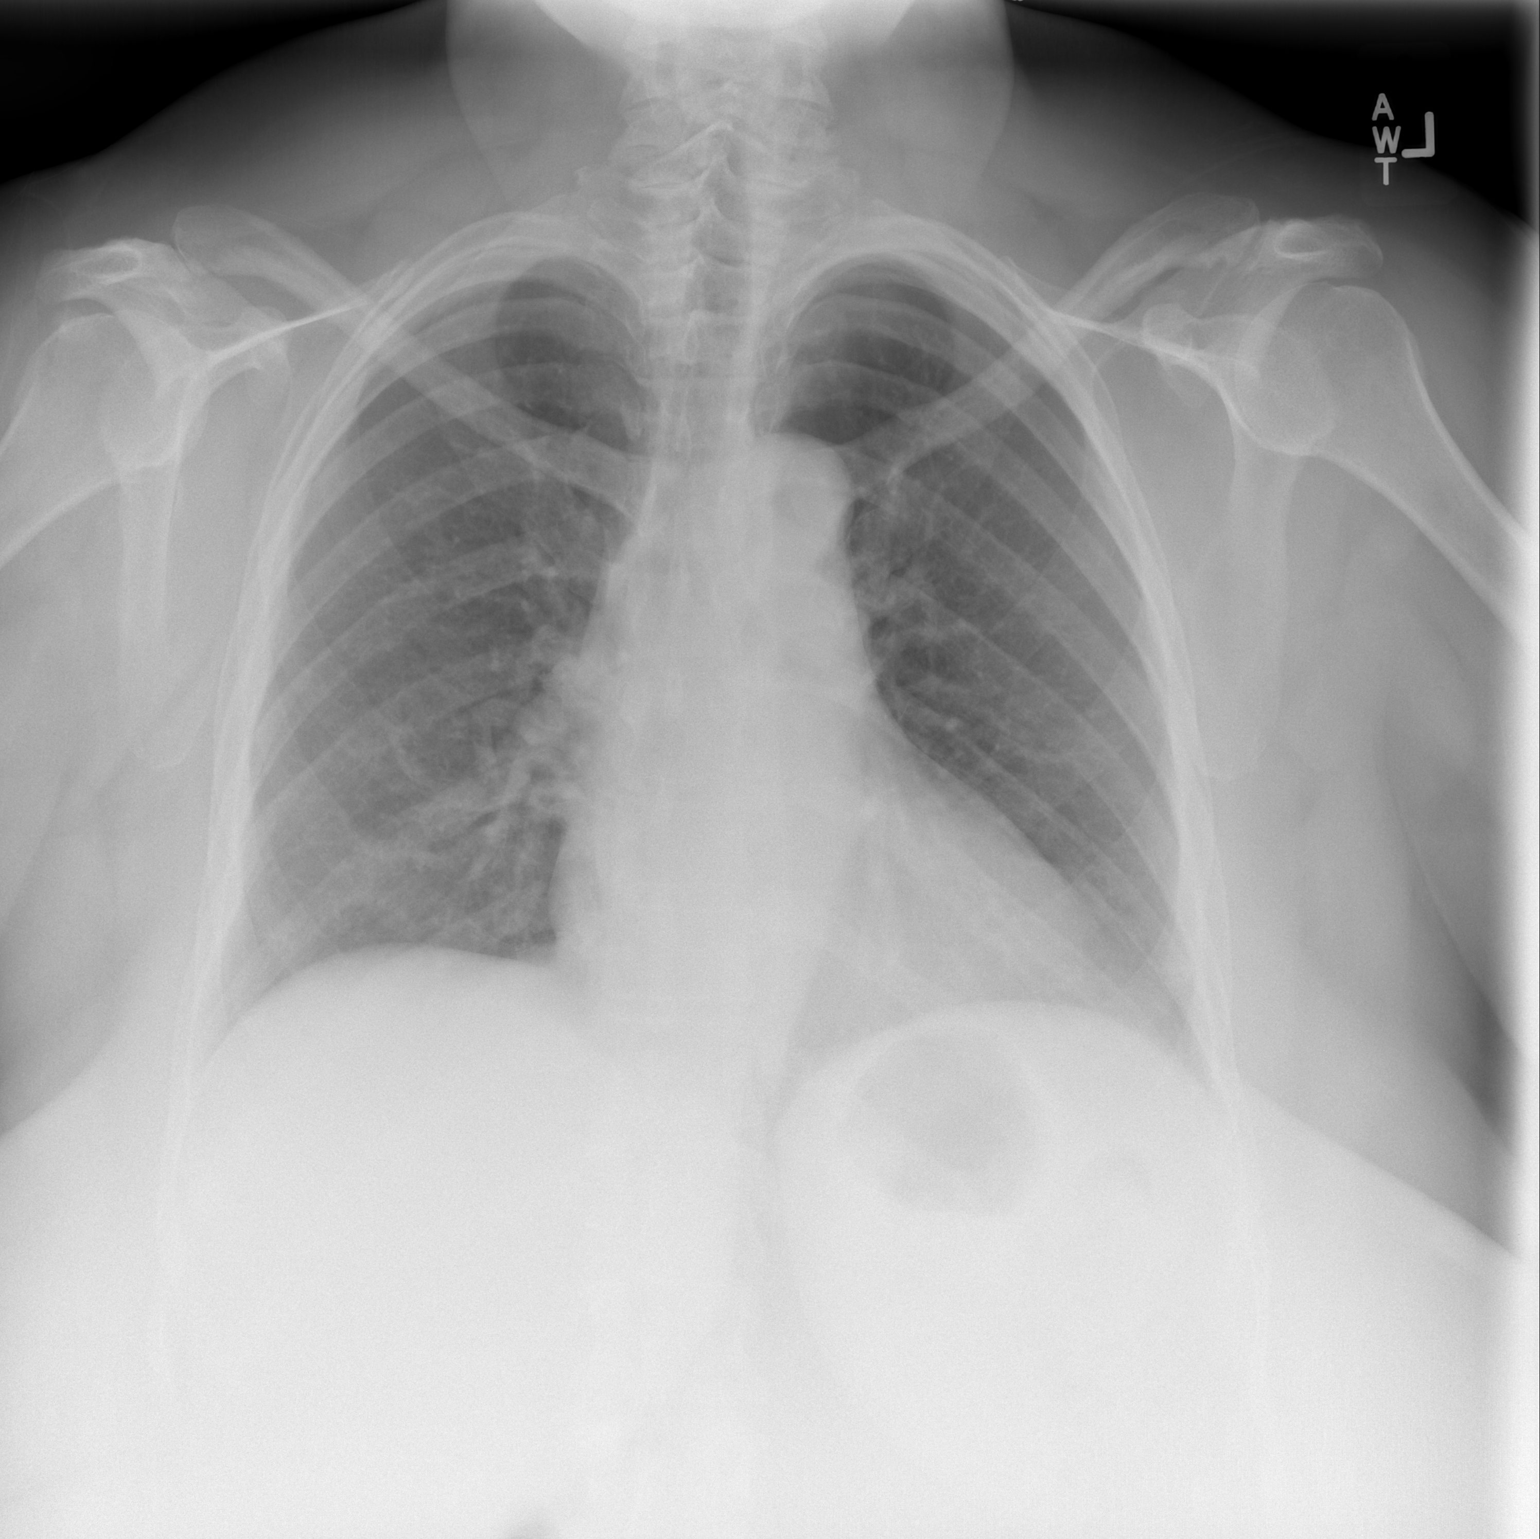

[w chest lat]
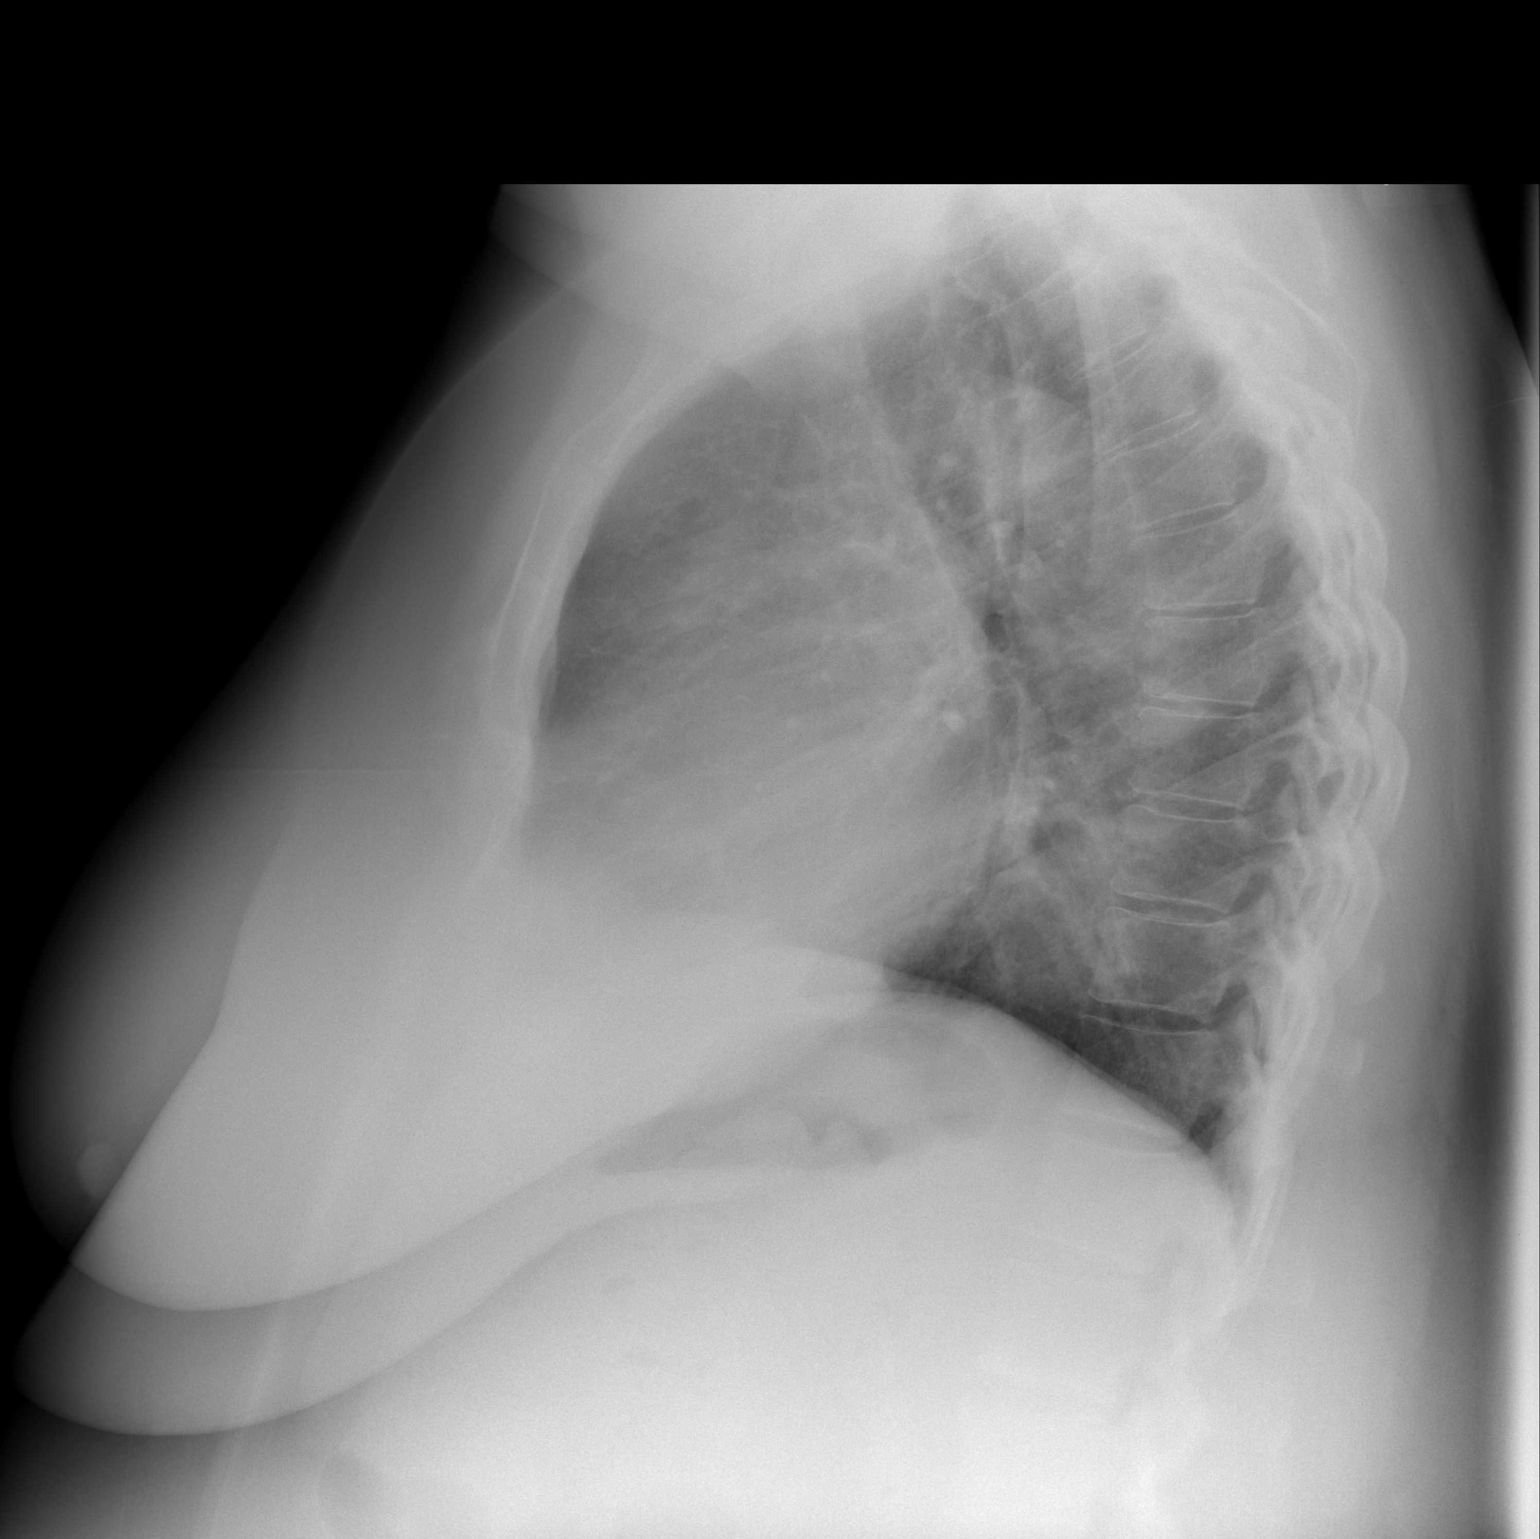

[2 of 2 positions shown; findings below may reference images not displayed]

FINDINGS: Mediastinum hilar structures normal. Lungs are clear. Heart size
normal. No pleural effusion or pneumothorax.
IMPRESSION: No acute cardiopulmonary disease.

## 2017-05-17 ENCOUNTER — Other Ambulatory Visit: Payer: Self-pay | Admitting: Family Medicine

## 2017-05-22 ENCOUNTER — Other Ambulatory Visit: Payer: Self-pay

## 2017-05-22 MED ORDER — METFORMIN HCL ER 500 MG PO TB24: 1000.0000 mg | ORAL_TABLET | Freq: Every day | ORAL | 2 refills | Status: DC

## 2017-05-30 ENCOUNTER — Other Ambulatory Visit: Payer: Self-pay | Admitting: "Endocrinology

## 2017-06-07 ENCOUNTER — Other Ambulatory Visit: Payer: Self-pay | Admitting: "Endocrinology

## 2017-06-13 ENCOUNTER — Encounter (HOSPITAL_COMMUNITY): Payer: Self-pay

## 2017-06-15 ENCOUNTER — Other Ambulatory Visit: Payer: Self-pay

## 2017-06-15 MED ORDER — FREESTYLE LIBRE 14 DAY SENSOR MISC: 1.0000 | 5 refills | Status: DC

## 2017-06-15 NOTE — Progress Notes (Signed)
Pt is teating BG 4 x daily as directed

## 2017-06-26 LAB — HEMOGLOBIN A1C: HEMOGLOBIN A1C: 9.6

## 2017-06-26 LAB — BASIC METABOLIC PANEL
BUN: 9 (ref 4–21)
CREATININE: 1 (ref <=1.1)

## 2017-06-29 ENCOUNTER — Encounter: Payer: Self-pay | Admitting: "Endocrinology

## 2017-06-29 ENCOUNTER — Ambulatory Visit: Payer: Medicare Other | Admitting: "Endocrinology

## 2017-06-29 VITALS — BP 144/82 | HR 69 | Ht 69.0 in | Wt 292.0 lb

## 2017-06-29 DIAGNOSIS — I10 Essential (primary) hypertension: Secondary | ICD-10-CM

## 2017-06-29 DIAGNOSIS — IMO0002 Reserved for concepts with insufficient information to code with codable children: Secondary | ICD-10-CM

## 2017-06-29 DIAGNOSIS — E1165 Type 2 diabetes mellitus with hyperglycemia: Secondary | ICD-10-CM | POA: Diagnosis not present

## 2017-06-29 DIAGNOSIS — E118 Type 2 diabetes mellitus with unspecified complications: Secondary | ICD-10-CM | POA: Diagnosis not present

## 2017-06-29 DIAGNOSIS — Z794 Long term (current) use of insulin: Secondary | ICD-10-CM | POA: Diagnosis not present

## 2017-06-29 DIAGNOSIS — E782 Mixed hyperlipidemia: Secondary | ICD-10-CM

## 2017-06-29 MED ORDER — INSULIN ASPART 100 UNIT/ML FLEXPEN: 20.0000 [IU] | PEN_INJECTOR | Freq: Three times a day (TID) | SUBCUTANEOUS | 3 refills | Status: DC

## 2017-06-29 MED ORDER — INSULIN DEGLUDEC 200 UNIT/ML ~~LOC~~ SOPN: 100.0000 [IU] | PEN_INJECTOR | Freq: Every day | SUBCUTANEOUS | 2 refills | Status: DC

## 2017-06-29 NOTE — Patient Instructions (Signed)

## 2017-06-29 NOTE — Progress Notes (Signed)
Subjective:    Patient ID: Emily Leon, female    DOB: 1951/01/28,    Past Medical History:  Diagnosis Date  . Arthritis   . Bronchitis    hx of   . Cataracts, bilateral   . Chronic kidney disease   . Cough   . Diabetes mellitus without complication (HCC)   . Hypertension   . Shortness of breath dyspnea    walking distances or climbing stairs  . Sleep apnea    uses CPAP  . Tingling    toes bilat   . Urinary frequency    Past Surgical History:  Procedure Laterality Date  . ABDOMINAL HYSTERECTOMY    . BREATH TEK H PYLORI N/A 08/26/2014   Procedure: BREATH TEK H PYLORI;  Surgeon: Gaynelle Adu, MD;  Location: [facility];  Service: General;  Laterality: N/A;  . KNEE SURGERY Left 2009  . LAPAROSCOPIC GASTRIC SLEEVE RESECTION N/A 11/11/2014   Procedure: LAPAROSCOPIC GASTRIC SLEEVE RESECTION;  Surgeon: Gaynelle Adu, MD;  Location: [facility];  Service: General;  Laterality: N/A;  . UPPER GI ENDOSCOPY  11/11/2014   Procedure: UPPER GI ENDOSCOPY;  Surgeon: Gaynelle Adu, MD;  Location: [facility];  Service: General;;   Social History   Socioeconomic History  . Marital status: Married    Spouse name: None  . Number of children: None  . Years of education: None  . Highest education level: None  Social Needs  . Financial resource strain: None  . Food insecurity - worry: None  . Food insecurity - inability: None  . Transportation needs - medical: None  . Transportation needs - non-medical: None  Occupational History  . None  Tobacco Use  . Smoking status: Never Smoker  . Smokeless tobacco: Never Used  Substance and Sexual Activity  . Alcohol use: No  . Drug use: No  . Sexual activity: None  Other Topics Concern  . None  Social History Narrative  . None   Outpatient Encounter Medications as of 06/29/2017  Medication Sig  . amLODipine (NORVASC) 10 MG tablet Take 10 mg by mouth every morning.  Marland Kitchen atenolol (TENORMIN) 100 MG tablet Take 100 mg by mouth every morning.   .  B-D UF III MINI PEN NEEDLES 31G X 5 MM MISC USE 4 TIMES DAILY AS DIRECTED  . Continuous Blood Gluc Sensor (FREESTYLE LIBRE 14 DAY SENSOR) MISC 1 each by Does not apply route every 14 (fourteen) days.  . ergocalciferol (VITAMIN D2) 50000 UNITS capsule Take 50,000 Units by mouth every Monday.  . ezetimibe-simvastatin (VYTORIN) 10-40 MG tablet TAKE 1 TABLET BY MOUTH AT BEDTIME  . hydrochlorothiazide (HYDRODIURIL) 25 MG tablet Take 12.5 mg by mouth every morning.   . insulin aspart (NOVOLOG FLEXPEN) 100 UNIT/ML FlexPen Inject 20-26 Units into the skin 3 (three) times daily with meals.  . Insulin Degludec (TRESIBA FLEXTOUCH) 200 UNIT/ML SOPN Inject 100 Units into the skin daily with breakfast.  . loratadine (CLARITIN) 10 MG tablet Take 10 mg by mouth daily.  . metFORMIN (GLUCOPHAGE-XR) 500 MG 24 hr tablet Take 2 tablets (1,000 mg total) by mouth daily with breakfast.  . ondansetron (ZOFRAN ODT) 4 MG disintegrating tablet Take 1 tablet (4 mg total) by mouth every 8 (eight) hours as needed for nausea or vomiting.  Marland Kitchen oxyCODONE (ROXICODONE) 5 MG/5ML solution Take 5-10 mLs (5-10 mg total) by mouth every 4 (four) hours as needed for moderate pain or severe pain.  . pantoprazole (PROTONIX) 40 MG tablet Take 1  tablet (40 mg total) by mouth daily.  . TRULICITY 1.5 MG/0.5ML SOPN INJECT 1.5 MG ONCE A WEEK INTO THE SKIN.  . valsartan (DIOVAN) 160 MG tablet Take 160 mg by mouth every morning.   . [DISCONTINUED] Continuous Blood Gluc Sensor (FREESTYLE LIBRE SENSOR SYSTEM) MISC Use one sensor every 10 days.  . [DISCONTINUED] insulin aspart (NOVOLOG FLEXPEN) 100 UNIT/ML FlexPen Inject 18-24 Units into the skin 3 (three) times daily with meals.  . [DISCONTINUED] Insulin Degludec (TRESIBA FLEXTOUCH) 200 UNIT/ML SOPN Inject 80 Units into the skin daily with breakfast.  . [DISCONTINUED] Insulin Degludec (TRESIBA FLEXTOUCH) 200 UNIT/ML SOPN Inject 80 Units into the skin every morning.  . [DISCONTINUED] NOVOLOG FLEXPEN 100  UNIT/ML FlexPen INJECT 30 UNITS SUB-Q 3 TIMES A DAY   No facility-administered encounter medications on file as of 06/29/2017.    ALLERGIES: Allergies  Allergen Reactions  . Penicillins Itching and Rash  . Sulfa Antibiotics Itching and Rash   VACCINATION STATUS:  There is no immunization history on file for this patient.  Diabetes  She presents for her follow-up diabetic visit. She has type 2 diabetes mellitus. Onset time: She was diagnosed at approximate age of 27 years. Her disease course has been worsening. There are no hypoglycemic associated symptoms. Pertinent negatives for hypoglycemia include no confusion, headaches, pallor or seizures. There are no diabetic associated symptoms. Pertinent negatives for diabetes include no chest pain, no fatigue and no polyphagia. There are no hypoglycemic complications. Symptoms are worsening. There are no diabetic complications. Risk factors for coronary artery disease include dyslipidemia, diabetes mellitus, obesity, hypertension and sedentary lifestyle. Current diabetic treatment includes intensive insulin program. She is compliant with treatment some of the time. Her weight is stable. She is following a generally unhealthy diet. When asked about meal planning, she reported none. Prior visit with dietitian: She will be scheduled with a dietitian here. She never participates in exercise. Her home blood glucose trend is decreasing steadily. Her breakfast blood glucose range is generally 180-200 mg/dl. Her lunch blood glucose range is generally >200 mg/dl. Her dinner blood glucose range is generally >200 mg/dl. Her overall blood glucose range is >200 mg/dl. An ACE inhibitor/angiotensin II receptor blocker is being taken. Eye exam is current.  Hyperlipidemia  This is a chronic problem. The current episode started more than 1 year ago. The problem is controlled. Exacerbating diseases include diabetes and obesity. Pertinent negatives include no chest pain,  myalgias or shortness of breath. Risk factors for coronary artery disease include diabetes mellitus, dyslipidemia, hypertension, obesity and a sedentary lifestyle.  Hypertension  This is a chronic problem. The current episode started more than 1 year ago. Pertinent negatives include no chest pain, headaches, palpitations or shortness of breath. Risk factors for coronary artery disease include dyslipidemia, diabetes mellitus, obesity and sedentary lifestyle. Past treatments include angiotensin blockers.   Review of Systems  Constitutional: Negative for fatigue and unexpected weight change.  HENT: Negative for trouble swallowing and voice change.   Eyes: Negative for visual disturbance.  Respiratory: Negative for cough, shortness of breath and wheezing.   Cardiovascular: Negative for chest pain, palpitations and leg swelling.  Gastrointestinal: Negative for diarrhea, nausea and vomiting.  Endocrine: Negative for cold intolerance, heat intolerance and polyphagia.  Musculoskeletal: Negative for arthralgias and myalgias.  Skin: Negative for color change, pallor, rash and wound.  Neurological: Negative for seizures and headaches.  Psychiatric/Behavioral: Negative for confusion and suicidal ideas.    Objective:    BP (!) 144/82   Pulse  69   Ht 5\' 9"  (1.753 m)   Wt 292 lb (132.5 kg)   BMI 43.12 kg/m   Wt Readings from Last 3 Encounters:  06/29/17 292 lb (132.5 kg)  03/30/17 285 lb (129.3 kg)  03/30/17 285 lb (129.3 kg)    Physical Exam  Constitutional: She is oriented to person, place, and time. She appears well-developed.  HENT:  Head: Normocephalic and atraumatic.  Eyes: EOM are normal.  Neck: Normal range of motion. Neck supple. No tracheal deviation present. No thyromegaly present.  Cardiovascular: Normal rate and regular rhythm.  Pulmonary/Chest: Effort normal and breath sounds normal.  Abdominal: Soft. Bowel sounds are normal. There is no tenderness. There is no guarding.   Musculoskeletal: Normal range of motion. She exhibits no edema.  Neurological: She is alert and oriented to person, place, and time. She has normal reflexes. No cranial nerve deficit. Coordination normal.  Skin: Skin is warm and dry. No rash noted. No erythema. No pallor.  Psychiatric: She has a normal mood and affect. Judgment normal.    Complete Blood Count (Most recent): Lab Results  Component Value Date   WBC 11.9 (H) 11/13/2014   HGB 13.4 11/13/2014   HCT 41.3 11/13/2014   MCV 90.2 11/13/2014   PLT 317 11/13/2014   Chemistry (most recent): Lab Results  Component Value Date   NA 134 (L) 11/12/2014   K 4.5 11/12/2014   CL 99 (L) 11/12/2014   CO2 26 11/12/2014   BUN 9 06/26/2017   CREATININE 1.0 06/26/2017   Diabetic Labs (most recent): Lab Results  Component Value Date   HGBA1C 9.6 06/26/2017   HGBA1C 9.2 03/23/2017   HGBA1C 8.7 12/20/2016      Assessment & Plan:   1. Uncontrolled type 2 diabetes mellitus with complication, with long-term current use of insulin (HCC)  Her diabetes is  complicated by obesity status post  Sleeve bariatric surgery and patient remains at a high risk for more acute and chronic complications of diabetes which include CAD, CVA, CKD, retinopathy, and neuropathy. These are all discussed in detail with the patient.  Patient came with persistently above target glucose profile,   recent A1c  of  9.6%, progressively increasing from 8.7%.   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    - Glucose logs and insulin administration records pertaining to this visit,  to be scanned into patient's records.  Recent labs reviewed.  - I have re-counseled the patient on diet management and weight loss  by adopting a carbohydrate restricted / protein rich  Diet. -Since her surgery she lost 40 pounds, and has stabilized.  .-  Suggestion is made for her to avoid simple carbohydrates  from her diet including Cakes, Sweet Desserts /  Pastries, Ice Cream, Soda (diet and regular), Sweet Tea, Candies, Chips, Cookies, Store Bought Juices, Alcohol in Excess of  1-2 drinks a day, Artificial Sweeteners, and "Sugar-free" Products. This will help patient to have stable blood glucose profile and potentially avoid unintended weight gain.  - Patient is advised to stick to a routine mealtimes to eat 3 meals  a day and avoid unnecessary snacks ( to snack only to correct hypoglycemia).   - I have approached patient with the following individualized plan to manage diabetes and patient agrees.  She is s/p sleeve GBP, EAG better at 232, she will continue to need higher dose of insulin to achieve control of diabetes to target.  - Emphasizing compliance on consistency in  timing  of   meals and insulin ,  she is advised to increase   Tresiba to 100 units   in the morning since she is sleeping and forgetting  at night, and increase NovoLog to 20 units  3 times a day before meals plus correction.  - She is now using continuous glucose monitor. I advised her to document blood glucose at least 4 times a day, before meals and at bedtime..  - She is warned not to take insulin without documenting blood glucose off of the sensor.  -She tolerated Glumetza , I advised her to continue  1000 mg  Glumetza extended release once a day.  - She has tolerated Trulicity ,  I advised her to continue  1.5 mg subcutaneously weekly.  - Patient specific target  for A1c; LDL, HDL, Triglycerides, and  Waist Circumference were discussed in detail.  2) BP/HTN: Controlled. Continue current medications including ACEI/ARB. 3) Lipids/HPL:  continue statins /Vytorin. 4)  Weight/Diet:    CDE consult was initiated but she did not follow through  , exercise, and carbohydrates information provided.  5) Chronic Care/Health Maintenance:  -Patient is on ACEI/ARB and Statin medications and encouraged to continue to follow up with Ophthalmology, Podiatrist at least yearly or  according to recommendations, and advised to  stay away from smoking. I have recommended yearly flu vaccine and pneumonia vaccination at least every 5 years; moderate intensity exercise for up to 150 minutes weekly; and  sleep for at least 7 hours a day.  I advised patient to maintain close follow up with her PCP for primary care needs.  - Time spent with the patient: 25 min, of which >50% was spent in reviewing her blood glucose logs , discussing her hypo- and hyper-glycemic episodes, reviewing her current and  previous labs and insulin doses and developing a plan to avoid hypo- and hyper-glycemia. Please refer to Patient Instructions for Blood Glucose Monitoring and Insulin/Medications Dosing Guide"  in media tab for additional information. Edison Simon Schweitzer participated in the discussions, expressed understanding, and voiced agreement with the above plans.  All questions were answered to her satisfaction. she is encouraged to contact clinic should she have any questions or concerns prior to her return visit.  Follow up plan: Return in about 4 weeks (around 07/27/2017) for follow up with meter and logs- no labs.  Marquis Lunch, MD Phone: [phone]  Fax: [phone]  This note was partially dictated with voice recognition software. Similar sounding words can be transcribed inadequately or may not  be corrected upon review.  06/29/2017, 4:38 PM

## 2017-07-27 ENCOUNTER — Encounter: Payer: Self-pay | Admitting: Nutrition

## 2017-07-27 ENCOUNTER — Ambulatory Visit (INDEPENDENT_AMBULATORY_CARE_PROVIDER_SITE_OTHER): Payer: Medicare Other | Admitting: "Endocrinology

## 2017-07-27 ENCOUNTER — Encounter: Payer: Self-pay | Admitting: "Endocrinology

## 2017-07-27 ENCOUNTER — Encounter: Payer: Medicare Other | Attending: Family Medicine | Admitting: Nutrition

## 2017-07-27 VITALS — Ht 69.0 in | Wt 288.0 lb

## 2017-07-27 VITALS — BP 142/80 | HR 71 | Ht 69.0 in | Wt 288.0 lb

## 2017-07-27 DIAGNOSIS — E119 Type 2 diabetes mellitus without complications: Secondary | ICD-10-CM | POA: Diagnosis not present

## 2017-07-27 DIAGNOSIS — I10 Essential (primary) hypertension: Secondary | ICD-10-CM | POA: Diagnosis not present

## 2017-07-27 DIAGNOSIS — IMO0002 Reserved for concepts with insufficient information to code with codable children: Secondary | ICD-10-CM

## 2017-07-27 DIAGNOSIS — E118 Type 2 diabetes mellitus with unspecified complications: Secondary | ICD-10-CM

## 2017-07-27 DIAGNOSIS — E669 Obesity, unspecified: Secondary | ICD-10-CM

## 2017-07-27 DIAGNOSIS — E1165 Type 2 diabetes mellitus with hyperglycemia: Secondary | ICD-10-CM

## 2017-07-27 DIAGNOSIS — Z794 Long term (current) use of insulin: Secondary | ICD-10-CM | POA: Diagnosis not present

## 2017-07-27 DIAGNOSIS — E782 Mixed hyperlipidemia: Secondary | ICD-10-CM | POA: Diagnosis not present

## 2017-07-27 DIAGNOSIS — Z713 Dietary counseling and surveillance: Secondary | ICD-10-CM | POA: Diagnosis not present

## 2017-07-27 DIAGNOSIS — Z9884 Bariatric surgery status: Secondary | ICD-10-CM

## 2017-07-27 NOTE — Patient Instructions (Addendum)
Goals 1.  Add  2 egg to breakfast Cut out toast with breakfast 2. Increaser protein-greek yogurt Exercise 45-60 minutes, Keep BS after meals less than 180 mg/dl Drink water or eat protein when BS is 100.

## 2017-07-27 NOTE — Progress Notes (Signed)
  Medical Nutrition Therapy:  Appt start time: 1100end time:  1115  Assessment:  Primary concerns today: Diabetes Type 2.  Follow up. Saw Dr. Fransico HimNida today. Using the [employer] downloaded. Avg 170 mg/dl. Highest BS are after lunch and before dinner. 37% BS are above target, 62" are in range. onlly 1% are considers low blood sugars. S/p gastric sleeve. Lost 4 lbs in the last month.  No significant weight loss in the last 2 years. She does siliver sneakrs 1-2 times per week. Needs more cardio exercise. Needs to cut out bacon, sausage and processed meats Still taking 100 units of Tresiba daily and Novolog with meals sliding scale staring at 20 units with meals.   Curent diet needs more protein and more lower carb vegetables. Needs to avoid drinking juice when BS is only 100. Doesn't need to treat BS til 70 mg/dl or below  Needs better commitment to less calories, increase exercise and compliance with medications.  Lab Results  Component Value Date   HGBA1C 9.6 06/26/2017   Preferred Learning Style:  No preference indicated   Learning Readiness:  Ready  Change in progress   MEDICATIONS:   DIETARY INTAKE:  24-hr recall:  B ( AM):  1/2 c oatmeal  fruit and, and 1 slice toast,   Snk ( AM): none  L ( PM):FIsh baked, salad and sweet potato, water D ( PM): Toss salad, baked chicken and fruit, water and 1 slice bread Snk ( PM):   Beverages: water  Usual physical activity: Silver Sneakers and Curves.  Estimated energy needs: 1200-1500 calories 170 g carbohydrates 120 g protein 39 g fat  Progress Towards Goal(s):  In progress.   Nutritional Diagnosis:  NB-1.1 Food and nutrition-related knowledge deficit As related to Diabetes.  As evidenced by A1C 9.2%.    Intervention:  Nutrition and Diabetes education provided on My Plate, CHO counting, meal planning, portion sizes, timing of meals, avoiding snacks between meals unless having a low blood sugar, target ranges for A1C and blood  sugars, signs/symptoms and treatment of hyper/hypoglycemia, monitoring blood sugars, taking medications as prescribed, benefits of exercising 30 minutes per day and prevention of complications of DM. Reviewed post gastric bypass surgery recommendations and importance of compliance with diet and increasing exercise gradually as tolerated for needed weight loss.  Goals 1.  Add  2 egg to breakfast Cut out toast with breakfast 2. Increaser protein-greek yogurt Exercise 45-60 minutes, Keep BS after meals less than 180 mg/dl Drink water or eat protein when BS is 100.  Teaching Method Utilized:  Visual Auditory Hands on  Handouts given during visit include:  The Plate Method   Meal Plan Card  Diabetes Instructions  Barriers to learning/adherence to lifestyle change: None  Demonstrated degree of understanding via:  Teach Back   Monitoring/Evaluation:  Dietary intake, exercise, meal planning, SBG, and body weight in 3 month(s).

## 2017-07-27 NOTE — Progress Notes (Signed)
Subjective:    Patient ID: Emily Leon, female    DOB: 05-Aug-1950,    Past Medical History:  Diagnosis Date  . Arthritis   . Bronchitis    hx of   . Cataracts, bilateral   . Chronic kidney disease   . Cough   . Diabetes mellitus without complication (HCC)   . Hypertension   . Shortness of breath dyspnea    walking distances or climbing stairs  . Sleep apnea    uses CPAP  . Tingling    toes bilat   . Urinary frequency    Past Surgical History:  Procedure Laterality Date  . ABDOMINAL HYSTERECTOMY    . BREATH TEK H PYLORI N/A 08/26/2014   Procedure: BREATH TEK H PYLORI;  Surgeon: Gaynelle Adu, MD;  Location: [facility];  Service: General;  Laterality: N/A;  . KNEE SURGERY Left 2009  . LAPAROSCOPIC GASTRIC SLEEVE RESECTION N/A 11/11/2014   Procedure: LAPAROSCOPIC GASTRIC SLEEVE RESECTION;  Surgeon: Gaynelle Adu, MD;  Location: [facility];  Service: General;  Laterality: N/A;  . UPPER GI ENDOSCOPY  11/11/2014   Procedure: UPPER GI ENDOSCOPY;  Surgeon: Gaynelle Adu, MD;  Location: [facility];  Service: General;;   Social History   Socioeconomic History  . Marital status: Married    Spouse name: Not on file  . Number of children: Not on file  . Years of education: Not on file  . Highest education level: Not on file  Occupational History  . Not on file  Social Needs  . Financial resource strain: Not on file  . Food insecurity:    Worry: Not on file    Inability: Not on file  . Transportation needs:    Medical: Not on file    Non-medical: Not on file  Tobacco Use  . Smoking status: Never Smoker  . Smokeless tobacco: Never Used  Substance and Sexual Activity  . Alcohol use: No  . Drug use: No  . Sexual activity: Not on file  Lifestyle  . Physical activity:    Days per week: Not on file    Minutes per session: Not on file  . Stress: Not on file  Relationships  . Social connections:    Talks on phone: Not on file    Gets together: Not on file    Attends  religious service: Not on file    Active member of club or organization: Not on file    Attends meetings of clubs or organizations: Not on file    Relationship status: Not on file  Other Topics Concern  . Not on file  Social History Narrative  . Not on file   Outpatient Encounter Medications as of 07/27/2017  Medication Sig  . amLODipine (NORVASC) 10 MG tablet Take 10 mg by mouth every morning.  Marland Kitchen atenolol (TENORMIN) 100 MG tablet Take 100 mg by mouth every morning.   . B-D UF III MINI PEN NEEDLES 31G X 5 MM MISC USE 4 TIMES DAILY AS DIRECTED  . Continuous Blood Gluc Sensor (FREESTYLE LIBRE 14 DAY SENSOR) MISC 1 each by Does not apply route every 14 (fourteen) days.  . ergocalciferol (VITAMIN D2) 50000 UNITS capsule Take 50,000 Units by mouth every Monday.  . ezetimibe-simvastatin (VYTORIN) 10-40 MG tablet TAKE 1 TABLET BY MOUTH AT BEDTIME  . hydrochlorothiazide (HYDRODIURIL) 25 MG tablet Take 12.5 mg by mouth every morning.   . insulin aspart (NOVOLOG FLEXPEN) 100 UNIT/ML FlexPen Inject 20-26 Units into the skin  3 (three) times daily with meals.  . Insulin Degludec (TRESIBA FLEXTOUCH) 200 UNIT/ML SOPN Inject 100 Units into the skin daily with breakfast.  . loratadine (CLARITIN) 10 MG tablet Take 10 mg by mouth daily.  . metFORMIN (GLUCOPHAGE-XR) 500 MG 24 hr tablet Take 2 tablets (1,000 mg total) by mouth daily with breakfast.  . ondansetron (ZOFRAN ODT) 4 MG disintegrating tablet Take 1 tablet (4 mg total) by mouth every 8 (eight) hours as needed for nausea or vomiting.  Marland Kitchen oxyCODONE (ROXICODONE) 5 MG/5ML solution Take 5-10 mLs (5-10 mg total) by mouth every 4 (four) hours as needed for moderate pain or severe pain.  . pantoprazole (PROTONIX) 40 MG tablet Take 1 tablet (40 mg total) by mouth daily.  . TRULICITY 1.5 MG/0.5ML SOPN INJECT 1.5 MG ONCE A WEEK INTO THE SKIN.  . valsartan (DIOVAN) 160 MG tablet Take 160 mg by mouth every morning.    No facility-administered encounter medications  on file as of 07/27/2017.    ALLERGIES: Allergies  Allergen Reactions  . Penicillins Itching and Rash  . Sulfa Antibiotics Itching and Rash   VACCINATION STATUS:  There is no immunization history on file for this patient.  Diabetes  Emily Leon presents for her follow-up diabetic visit. Emily Leon has type 2 diabetes mellitus. Onset time: Emily Leon was diagnosed at approximate age of 27 years. Her disease course has been improving. There are no hypoglycemic associated symptoms. Pertinent negatives for hypoglycemia include no confusion, headaches, pallor or seizures. There are no diabetic associated symptoms. Pertinent negatives for diabetes include no chest pain, no fatigue and no polyphagia. There are no hypoglycemic complications. Symptoms are improving. There are no diabetic complications. Risk factors for coronary artery disease include dyslipidemia, diabetes mellitus, obesity, hypertension and sedentary lifestyle. Current diabetic treatment includes intensive insulin program. Emily Leon is compliant with treatment some of the time. Her weight is stable. Emily Leon is following a generally unhealthy diet. When asked about meal planning, Emily Leon reported none. Prior visit with dietitian: Emily Leon will be scheduled with a dietitian here. Emily Leon never participates in exercise. Her home blood glucose trend is decreasing steadily. Her breakfast blood glucose range is generally 140-180 mg/dl. Her lunch blood glucose range is generally 180-200 mg/dl. Her dinner blood glucose range is generally 180-200 mg/dl. Her bedtime blood glucose range is generally >200 mg/dl. Her overall blood glucose range is >200 mg/dl. An ACE inhibitor/angiotensin II receptor blocker is being taken. Eye exam is current.  Hyperlipidemia  This is a chronic problem. The current episode started more than 1 year ago. The problem is uncontrolled. Exacerbating diseases include diabetes and obesity. Pertinent negatives include no chest pain, myalgias or shortness of breath. Current  antihyperlipidemic treatment includes statins. Risk factors for coronary artery disease include diabetes mellitus, dyslipidemia, hypertension, obesity and a sedentary lifestyle.  Hypertension  This is a chronic problem. The current episode started more than 1 year ago. The problem is uncontrolled. Pertinent negatives include no chest pain, headaches, palpitations or shortness of breath. Risk factors for coronary artery disease include dyslipidemia, diabetes mellitus, obesity and sedentary lifestyle. Past treatments include angiotensin blockers.   Review of Systems  Constitutional: Negative for fatigue and unexpected weight change.  HENT: Negative for trouble swallowing and voice change.   Eyes: Negative for visual disturbance.  Respiratory: Negative for cough, shortness of breath and wheezing.   Cardiovascular: Negative for chest pain, palpitations and leg swelling.  Gastrointestinal: Negative for diarrhea, nausea and vomiting.  Endocrine: Negative for cold intolerance, heat intolerance and polyphagia.  Musculoskeletal: Negative for arthralgias and myalgias.  Skin: Negative for color change, pallor, rash and wound.  Neurological: Negative for seizures and headaches.  Psychiatric/Behavioral: Negative for confusion and suicidal ideas.    Objective:    BP (!) 142/80   Pulse 71   Ht 5\' 9"  (1.753 m)   Wt 288 lb (130.6 kg)   BMI 42.53 kg/m   Wt Readings from Last 3 Encounters:  07/27/17 288 lb (130.6 kg)  07/27/17 288 lb (130.6 kg)  06/29/17 292 lb (132.5 kg)    Physical Exam  Constitutional: Emily Leon is oriented to person, place, and time. Emily Leon appears well-developed.  HENT:  Head: Normocephalic and atraumatic.  Eyes: EOM are normal.  Neck: Normal range of motion. Neck supple. No tracheal deviation present. No thyromegaly present.  Cardiovascular: Normal rate.  Pulmonary/Chest: Effort normal.  Abdominal: There is no tenderness. There is no guarding.  Musculoskeletal: Normal range of  motion. Emily Leon exhibits no edema.  Neurological: Emily Leon is alert and oriented to person, place, and time. Emily Leon has normal reflexes. No cranial nerve deficit. Coordination normal.  Skin: Skin is warm and dry. No rash noted. No erythema. No pallor.  Psychiatric: Emily Leon has a normal mood and affect. Judgment normal.    Complete Blood Count (Most recent): Lab Results  Component Value Date   WBC 11.9 (H) 11/13/2014   HGB 13.4 11/13/2014   HCT 41.3 11/13/2014   MCV 90.2 11/13/2014   PLT 317 11/13/2014   Chemistry (most recent): Lab Results  Component Value Date   NA 134 (L) 11/12/2014   K 4.5 11/12/2014   CL 99 (L) 11/12/2014   CO2 26 11/12/2014   BUN 9 06/26/2017   CREATININE 1.0 06/26/2017   Diabetic Labs (most recent): Lab Results  Component Value Date   HGBA1C 9.6 06/26/2017   HGBA1C 9.2 03/23/2017   HGBA1C 8.7 12/20/2016    Lipid Panel     Component Value Date/Time   CHOL 187 03/23/2017   TRIG 162 (A) 03/23/2017   HDL 36 03/23/2017   LDLCALC 119 03/23/2017     Assessment & Plan:   1. Uncontrolled type 2 diabetes mellitus with complication, with long-term current use of insulin (HCC)  Her diabetes is  complicated by obesity status post  Sleeve bariatric surgery and patient remains at a high risk for more acute and chronic complications of diabetes which include CAD, CVA, CKD, retinopathy, and neuropathy. These are all discussed in detail with the patient.  Patient came with slightly improving blood glucose profile, wearing CGM device.  62% time in range, 37% above target.  No hypoglycemia.   - her recent A1c  of  9.6%, progressively increasing from 8.7%.   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    - Glucose logs and insulin administration records pertaining to this visit,  to be scanned into patient's records.  Recent labs reviewed.  - I have re-counseled the patient on diet management and weight loss  by adopting a carbohydrate restricted /  protein rich  Diet. -Since her surgery Emily Leon lost 40 pounds, and has stabilized.  -  Suggestion is made for her to avoid simple carbohydrates  from her diet including Cakes, Sweet Desserts / Pastries, Ice Cream, Soda (diet and regular), Sweet Tea, Candies, Chips, Cookies, Store Bought Juices, Alcohol in Excess of  1-2 drinks a day, Artificial Sweeteners, and "Sugar-free" Products. This will help patient to have stable blood glucose profile and potentially avoid unintended weight gain.   -  Patient is advised to stick to a routine mealtimes to eat 3 meals  a day and avoid unnecessary snacks ( to snack only to correct hypoglycemia).   - I have approached patient with the following individualized plan to manage diabetes and patient agrees.  Emily Leon is s/p sleeve GBP, EAG better at 170 improving from 232, Emily Leon will continue to need higher dose of insulin to achieve control of diabetes to target.  - Emphasizing compliance on consistency in  timing of   meals and insulin ,  Emily Leon is advised to continue Tresiba 100 units   in the morning since Emily Leon is sleeping and forgetting  at night, and continue NovoLog  20 units  3 times a day before meals plus correction.  - I advised her to document blood glucose at least 4 times a day, before meals and at bedtime.  - Emily Leon is warned not to take insulin without documenting blood glucose off of the sensor.  -Emily Leon tolerated Glumetza , I advised her to continue  1000 mg  Glumetza extended release once a day.  -I advised her to continue Trulicity  1.5 mg subcutaneously weekly.  - Patient specific target  for A1c; LDL, HDL, Triglycerides, and  Waist Circumference were discussed in detail.  2) BP/HTN: Her blood pressure is not controlled to target.   Continue current medications including ACEI/ARB. 3) Lipids/HPL: Uncontrolled with LDL of 119.  Emily Leon is advised to continue continue Vytorin. 4)  Weight/Diet:    CDE consult was initiated but Emily Leon did not follow through  , exercise,  and carbohydrates information provided.  5) Chronic Care/Health Maintenance:  -Patient is on ACEI/ARB and Statin medications and encouraged to continue to follow up with Ophthalmology, Podiatrist at least yearly or according to recommendations, and advised to  stay away from smoking. I have recommended yearly flu vaccine and pneumonia vaccination at least every 5 years; moderate intensity exercise for up to 150 minutes weekly; and  sleep for at least 7 hours a day.  I advised patient to maintain close follow up with her PCP for primary care needs. - Time spent with the patient: 25 min, of which >50% was spent in reviewing her blood glucose logs , discussing her hypo- and hyper-glycemic episodes, reviewing her current and  previous labs and insulin doses and developing a plan to avoid hypo- and hyper-glycemia. Please refer to Patient Instructions for Blood Glucose Monitoring and Insulin/Medications Dosing Guide"  in media tab for additional information. Edison SimonBarbaretta S Raimondi participated in the discussions, expressed understanding, and voiced agreement with the above plans.  All questions were answered to her satisfaction. Emily Leon is encouraged to contact clinic should Emily Leon have any questions or concerns prior to her return visit.   Follow up plan: Return in about 3 months (around 10/26/2017) for follow up with pre-visit labs, meter, and logs.  Marquis LunchGebre Hoover Grewe, MD Phone: [phone]  Fax: [phone]  This note was partially dictated with voice recognition software. Similar sounding words can be transcribed inadequately or may not  be corrected upon review.  07/27/2017, 2:27 PM

## 2017-07-27 NOTE — Patient Instructions (Signed)

## 2017-07-31 ENCOUNTER — Other Ambulatory Visit: Payer: Self-pay | Admitting: "Endocrinology

## 2017-09-19 ENCOUNTER — Other Ambulatory Visit: Payer: Self-pay | Admitting: "Endocrinology

## 2017-10-30 ENCOUNTER — Other Ambulatory Visit: Payer: Self-pay | Admitting: "Endocrinology

## 2017-10-31 ENCOUNTER — Ambulatory Visit: Payer: Medicare Other | Admitting: "Endocrinology

## 2017-10-31 ENCOUNTER — Ambulatory Visit: Payer: Medicare Other | Admitting: Nutrition

## 2017-11-06 LAB — TSH: TSH: 1.36 (ref 0.41–5.90)

## 2017-11-06 LAB — BASIC METABOLIC PANEL
BUN: 14 (ref 4–21)
Creatinine: 1.1 (ref <=1.1)

## 2017-11-06 LAB — HEMOGLOBIN A1C: HEMOGLOBIN A1C: 9.6

## 2017-11-15 ENCOUNTER — Other Ambulatory Visit: Payer: Self-pay | Admitting: "Endocrinology

## 2017-11-21 ENCOUNTER — Ambulatory Visit (INDEPENDENT_AMBULATORY_CARE_PROVIDER_SITE_OTHER): Payer: Medicare Other | Admitting: "Endocrinology

## 2017-11-21 ENCOUNTER — Encounter: Payer: Medicare Other | Attending: Family Medicine | Admitting: Nutrition

## 2017-11-21 ENCOUNTER — Encounter: Payer: Self-pay | Admitting: Nutrition

## 2017-11-21 ENCOUNTER — Encounter: Payer: Self-pay | Admitting: "Endocrinology

## 2017-11-21 VITALS — Wt 290.0 lb

## 2017-11-21 VITALS — BP 140/53 | HR 81 | Ht 69.0 in | Wt 290.0 lb

## 2017-11-21 DIAGNOSIS — Z6841 Body Mass Index (BMI) 40.0 and over, adult: Secondary | ICD-10-CM | POA: Insufficient documentation

## 2017-11-21 DIAGNOSIS — E782 Mixed hyperlipidemia: Secondary | ICD-10-CM | POA: Diagnosis not present

## 2017-11-21 DIAGNOSIS — E119 Type 2 diabetes mellitus without complications: Secondary | ICD-10-CM | POA: Diagnosis not present

## 2017-11-21 DIAGNOSIS — Z713 Dietary counseling and surveillance: Secondary | ICD-10-CM | POA: Diagnosis not present

## 2017-11-21 DIAGNOSIS — E669 Obesity, unspecified: Secondary | ICD-10-CM | POA: Diagnosis not present

## 2017-11-21 DIAGNOSIS — E1165 Type 2 diabetes mellitus with hyperglycemia: Secondary | ICD-10-CM

## 2017-11-21 DIAGNOSIS — E118 Type 2 diabetes mellitus with unspecified complications: Secondary | ICD-10-CM

## 2017-11-21 DIAGNOSIS — I10 Essential (primary) hypertension: Secondary | ICD-10-CM | POA: Diagnosis not present

## 2017-11-21 DIAGNOSIS — IMO0002 Reserved for concepts with insufficient information to code with codable children: Secondary | ICD-10-CM

## 2017-11-21 DIAGNOSIS — Z794 Long term (current) use of insulin: Secondary | ICD-10-CM

## 2017-11-21 MED ORDER — INSULIN ASPART 100 UNIT/ML FLEXPEN: 25.0000 [IU] | PEN_INJECTOR | Freq: Three times a day (TID) | SUBCUTANEOUS | 3 refills | Status: DC

## 2017-11-21 NOTE — Progress Notes (Signed)
  Medical Nutrition Therapy:  Appt start time: 1100end time:  1115  Assessment:  Primary concerns today: Diabetes Type 2. Walkin  Follow up. Saw Dr. Fransico HimNida today. Using the [employer] downloaded. Avg 193 mg/dl. Just started Weight Watchers to work on weight loss and improve her DM. 54% of BS are above target. A1C still 9.6%.  Dr, Fransico HimNida increased her bolus insulin to 25 units with meals plus sliding scale and 100 units of Tresiba daily. Taking 1000 mg BID of Metformin. Gained 2 lbs. BS are still now well controlled. [facility] per week. Making better food choices with [employer]. Needs consistency in lifestyle changes.  Needs better commitment to less calories, increase exercise and compliance with medications.  Lab Results  Component Value Date   HGBA1C 9.6 11/06/2017   Preferred Learning Style:  No preference indicated   Learning Readiness:  Ready  Change in progress   MEDICATIONS:   DIETARY INTAKE:  24-hr recall:  B ( AM):  1/2 c oatmeal  fruit and, and 1 slice toast,   Snk ( AM): none  L ( PM):FIsh baked, salad and sweet potato, water D ( PM): Toss salad, baked chicken and fruit, water and 1 slice bread Snk ( PM):   Beverages: water  Usual physical activity: Silver Sneakers and Curves.  Estimated energy needs: 1200-1500 calories 170 g carbohydrates 120 g protein 39 g fat  Progress Towards Goal(s):  In progress.   Nutritional Diagnosis:  NB-1.1 Food and nutrition-related knowledge deficit As related to Diabetes.  As evidenced by A1C 9.2%.    Intervention:  Nutrition and Diabetes education provided on My Plate, CHO counting, meal planning, portion sizes, timing of meals, avoiding snacks between meals unless having a low blood sugar, target ranges for A1C and blood sugars, signs/symptoms and treatment of hyper/hypoglycemia, monitoring blood sugars, taking medications as prescribed, benefits of exercising 30 minutes per day and prevention of  complications of DM. Reviewed post gastric bypass surgery recommendations and importance of compliance with diet and increasing exercise gradually as tolerated for needed weight loss.    Goals 1. Eat 2-3 carb choices per meal 2. Walk 30 minutes every day 3. Use sliding scale as prescribed. 4. Lose 1 lb per week Cut out snacks, diet sodas and drink only water  Get A1C down to 8%.  Teaching Method Utilized:  Visual Auditory Hands on  Handouts given during visit include:  The Plate Method   Meal Plan Card  Diabetes Instructions  Barriers to learning/adherence to lifestyle change: None  Demonstrated degree of understanding via:  Teach Back   Monitoring/Evaluation:  Dietary intake, exercise, meal planning, SBG, and body weight in 3 month(s).

## 2017-11-21 NOTE — Progress Notes (Signed)
Endocrinology follow-up note  Subjective:    Patient ID: Emily Leon, female    DOB: 03/03/1951,    Past Medical History:  Diagnosis Date  . Arthritis   . Bronchitis    hx of   . Cataracts, bilateral   . Chronic kidney disease   . Cough   . Diabetes mellitus without complication (HCC)   . Hypertension   . Shortness of breath dyspnea    walking distances or climbing stairs  . Sleep apnea    uses CPAP  . Tingling    toes bilat   . Urinary frequency    Past Surgical History:  Procedure Laterality Date  . ABDOMINAL HYSTERECTOMY    . BREATH TEK H PYLORI N/A 08/26/2014   Procedure: BREATH TEK H PYLORI;  Surgeon: Gaynelle Adu, MD;  Location: [facility];  Service: General;  Laterality: N/A;  . KNEE SURGERY Left 2009  . LAPAROSCOPIC GASTRIC SLEEVE RESECTION N/A 11/11/2014   Procedure: LAPAROSCOPIC GASTRIC SLEEVE RESECTION;  Surgeon: Gaynelle Adu, MD;  Location: [facility];  Service: General;  Laterality: N/A;  . UPPER GI ENDOSCOPY  11/11/2014   Procedure: UPPER GI ENDOSCOPY;  Surgeon: Gaynelle Adu, MD;  Location: [facility];  Service: General;;   Social History   Socioeconomic History  . Marital status: Married    Spouse name: Not on file  . Number of children: Not on file  . Years of education: Not on file  . Highest education level: Not on file  Occupational History  . Not on file  Social Needs  . Financial resource strain: Not on file  . Food insecurity:    Worry: Not on file    Inability: Not on file  . Transportation needs:    Medical: Not on file    Non-medical: Not on file  Tobacco Use  . Smoking status: Never Smoker  . Smokeless tobacco: Never Used  Substance and Sexual Activity  . Alcohol use: No  . Drug use: No  . Sexual activity: Not on file  Lifestyle  . Physical activity:    Days per week: Not on file    Minutes per session: Not on file  . Stress: Not on file  Relationships  . Social connections:    Talks on phone: Not on file    Gets together: Not  on file    Attends religious service: Not on file    Active member of club or organization: Not on file    Attends meetings of clubs or organizations: Not on file    Relationship status: Not on file  Other Topics Concern  . Not on file  Social History Narrative  . Not on file   Outpatient Encounter Medications as of 11/21/2017  Medication Sig  . amLODipine (NORVASC) 10 MG tablet Take 10 mg by mouth every morning.  Marland Kitchen atenolol (TENORMIN) 100 MG tablet Take 100 mg by mouth every morning.   . B-D UF III MINI PEN NEEDLES 31G X 5 MM MISC USE 4 TIMES A DAY AS DIRECTED  . Continuous Blood Gluc Sensor (FREESTYLE LIBRE 14 DAY SENSOR) MISC 1 each by Does not apply route every 14 (fourteen) days.  . ergocalciferol (VITAMIN D2) 50000 UNITS capsule Take 50,000 Units by mouth every Monday.  . ezetimibe-simvastatin (VYTORIN) 10-40 MG tablet TAKE 1 TABLET BY MOUTH AT BEDTIME  . hydrochlorothiazide (HYDRODIURIL) 25 MG tablet Take 12.5 mg by mouth every morning.   . insulin aspart (NOVOLOG FLEXPEN) 100 UNIT/ML FlexPen Inject 25-31 Units  into the skin 3 (three) times daily before meals.  . Insulin Degludec (TRESIBA FLEXTOUCH) 200 UNIT/ML SOPN Inject 100 Units into the skin daily with breakfast.  . loratadine (CLARITIN) 10 MG tablet Take 10 mg by mouth daily.  . metFORMIN (GLUCOPHAGE-XR) 500 MG 24 hr tablet Take 2 tablets (1,000 mg total) by mouth daily with breakfast.  . ondansetron (ZOFRAN ODT) 4 MG disintegrating tablet Take 1 tablet (4 mg total) by mouth every 8 (eight) hours as needed for nausea or vomiting.  Marland Kitchen oxyCODONE (ROXICODONE) 5 MG/5ML solution Take 5-10 mLs (5-10 mg total) by mouth every 4 (four) hours as needed for moderate pain or severe pain.  . pantoprazole (PROTONIX) 40 MG tablet Take 1 tablet (40 mg total) by mouth daily.  . TRULICITY 1.5 MG/0.5ML SOPN INJECT 1.5 MG ONCE A WEEK INTO SKIN  . valsartan (DIOVAN) 160 MG tablet Take 160 mg by mouth every morning.   . [DISCONTINUED] insulin aspart  (NOVOLOG FLEXPEN) 100 UNIT/ML FlexPen Inject 20-26 Units into the skin 3 (three) times daily with meals.  . [DISCONTINUED] NOVOLOG FLEXPEN 100 UNIT/ML FlexPen INJECT 30 UNITS THREE TIMES DAILY   No facility-administered encounter medications on file as of 11/21/2017.    ALLERGIES: Allergies  Allergen Reactions  . Penicillins Itching and Rash  . Sulfa Antibiotics Itching and Rash   VACCINATION STATUS:  There is no immunization history on file for this patient.  Diabetes  She presents for her follow-up diabetic visit. She has type 2 diabetes mellitus. Onset time: She was diagnosed at approximate age of 27 years. Her disease course has been worsening. There are no hypoglycemic associated symptoms. Pertinent negatives for hypoglycemia include no confusion, headaches, pallor or seizures. There are no diabetic associated symptoms. Pertinent negatives for diabetes include no chest pain, no fatigue and no polyphagia. There are no hypoglycemic complications. Symptoms are worsening. There are no diabetic complications. Risk factors for coronary artery disease include dyslipidemia, diabetes mellitus, obesity, hypertension and sedentary lifestyle. Current diabetic treatment includes intensive insulin program. She is compliant with treatment some of the time. Her weight is stable. She is following a generally unhealthy diet. When asked about meal planning, she reported none. Prior visit with dietitian: She will be scheduled with a dietitian here. She never participates in exercise. Her home blood glucose trend is decreasing steadily. Her breakfast blood glucose range is generally 140-180 mg/dl. Her lunch blood glucose range is generally 180-200 mg/dl. Her dinner blood glucose range is generally 180-200 mg/dl. Her bedtime blood glucose range is generally >200 mg/dl. Her overall blood glucose range is >200 mg/dl. An ACE inhibitor/angiotensin II receptor blocker is being taken. Eye exam is current.  Hyperlipidemia   This is a chronic problem. The current episode started more than 1 year ago. The problem is uncontrolled. Exacerbating diseases include diabetes and obesity. Pertinent negatives include no chest pain, myalgias or shortness of breath. Current antihyperlipidemic treatment includes statins. Risk factors for coronary artery disease include diabetes mellitus, dyslipidemia, hypertension, obesity and a sedentary lifestyle.  Hypertension  This is a chronic problem. The current episode started more than 1 year ago. The problem is uncontrolled. Pertinent negatives include no chest pain, headaches, palpitations or shortness of breath. Risk factors for coronary artery disease include dyslipidemia, diabetes mellitus, obesity and sedentary lifestyle. Past treatments include angiotensin blockers.   Review of Systems  Constitutional: Negative for fatigue and unexpected weight change.  HENT: Negative for trouble swallowing and voice change.   Eyes: Negative for visual disturbance.  Respiratory: Negative for cough, shortness of breath and wheezing.   Cardiovascular: Negative for chest pain, palpitations and leg swelling.  Gastrointestinal: Negative for diarrhea, nausea and vomiting.  Endocrine: Negative for cold intolerance, heat intolerance and polyphagia.  Musculoskeletal: Negative for arthralgias and myalgias.  Skin: Negative for color change, pallor, rash and wound.  Neurological: Negative for seizures and headaches.  Psychiatric/Behavioral: Negative for confusion and suicidal ideas.    Objective:    BP (!) 140/53   Pulse 81   Ht 5\' 9"  (1.753 m)   Wt 290 lb (131.5 kg)   BMI 42.83 kg/m   Wt Readings from Last 3 Encounters:  11/21/17 290 lb (131.5 kg)  07/27/17 288 lb (130.6 kg)  07/27/17 288 lb (130.6 kg)    Physical Exam  Constitutional: She is oriented to person, place, and time. She appears well-developed.  HENT:  Head: Normocephalic and atraumatic.  Eyes: EOM are normal.  Neck: Normal  range of motion. Neck supple. No tracheal deviation present. No thyromegaly present.  Cardiovascular: Normal rate.  Pulmonary/Chest: Effort normal.  Abdominal: There is no tenderness. There is no guarding.  Musculoskeletal: Normal range of motion. She exhibits no edema.  Neurological: She is alert and oriented to person, place, and time. She has normal reflexes. No cranial nerve deficit. Coordination normal.  Skin: Skin is warm and dry. No rash noted. No erythema. No pallor.  Psychiatric: She has a normal mood and affect. Judgment normal.    Complete Blood Count (Most recent): Lab Results  Component Value Date   WBC 11.9 (H) 11/13/2014   HGB 13.4 11/13/2014   HCT 41.3 11/13/2014   MCV 90.2 11/13/2014   PLT 317 11/13/2014   Chemistry (most recent): Lab Results  Component Value Date   NA 134 (L) 11/12/2014   K 4.5 11/12/2014   CL 99 (L) 11/12/2014   CO2 26 11/12/2014   BUN 14 11/06/2017   CREATININE 1.1 11/06/2017   Diabetic Labs (most recent): Lab Results  Component Value Date   HGBA1C 9.6 11/06/2017   HGBA1C 9.6 06/26/2017   HGBA1C 9.2 03/23/2017    Lipid Panel     Component Value Date/Time   CHOL 187 03/23/2017   TRIG 162 (A) 03/23/2017   HDL 36 03/23/2017   LDLCALC 119 03/23/2017     Assessment & Plan:   1. Uncontrolled type 2 diabetes mellitus with complication, with long-term current use of insulin (HCC)  Her diabetes is  complicated by obesity status post  Sleeve bariatric surgery and patient remains at a high risk for more acute and chronic complications of diabetes which include CAD, CVA, CKD, retinopathy, and neuropathy. These are all discussed in detail with the patient.  Emily Leon came with loss of control her diabetes with A1c of 9.6%, CGM analysis showing 50% above range, 50% time range.  Her average blood glucose is 186 for the last 14 days.   -No reported nor documented hypoglycemia.   -She admits to dietary indiscretion including consumption of  soda/sweet tea.  - Glucose logs and insulin administration records pertaining to this visit,  to be scanned into patient's records.  Recent labs reviewed.  - I have re-counseled the patient on diet management and weight loss  by adopting a carbohydrate restricted / protein rich  Diet. -Since her surgery she lost 40 pounds, and has stabilized.  -  Suggestion is made for her to avoid simple carbohydrates  from her diet including Cakes, Sweet Desserts / Pastries, Ice Cream, Soda (diet  and regular), Sweet Tea, Candies, Chips, Cookies, Store Bought Juices, Alcohol in Excess of  1-2 drinks a day, Artificial Sweeteners, and "Sugar-free" Products. This will help patient to have stable blood glucose profile and potentially avoid unintended weight gain.  - Patient is advised to stick to a routine mealtimes to eat 3 meals  a day and avoid unnecessary snacks ( to snack only to correct hypoglycemia).   - I have approached patient with the following individualized plan to manage diabetes and patient agrees.  She is s/p sleeve GBP, EAG from her Libre CGM is higher at 186 than before.  - Emphasizing compliance on consistency in  timing of   meals and insulin ,  she is advised to continue Tresiba 100 units   in the morning since she is sleeping and forgetting  at night, and increase NovoLog to 25 units 3 times daily AC for pre-meal blood glucose readings above 90 mg/dL, plus patient specific correction for pre-meal blood glucose readings above 150 mg/dL.    -If she continues to require higher dose of U100 insulin above 200 units a day, she will be considered for insulin U500 on subsequent visits.  - I advised her to document blood glucose at least 4 times a day, before meals and at bedtime.  - She is warned not to take insulin without documenting blood glucose off of the sensor.  -She tolerated Glumetza , I advised her to continue  1000 mg  Glumetza extended release once a day.  -I advised her to continue  Trulicity  1.5 mg subcutaneously weekly.  - Patient specific target  for A1c; LDL, HDL, Triglycerides, and  Waist Circumference were discussed in detail.  2) BP/HTN: Her blood pressure is not controlled to target.  She is advised to continue her current blood pressure medications including hydrochlorothiazide 25 mg p.o. daily, valsartan 160 mg p.o. daily.    3) Lipids/HPL: Uncontrolled with LDL of 119.  She is advised to continue continue Vytorin. 4)  Weight/Diet:    CDE consult was initiated but she did not follow through  , exercise, and carbohydrates information provided.  5) Chronic Care/Health Maintenance:  -Patient is on ACEI/ARB and Statin medications and encouraged to continue to follow up with Ophthalmology, Podiatrist at least yearly or according to recommendations, and advised to  stay away from smoking. I have recommended yearly flu vaccine and pneumonia vaccination at least every 5 years; moderate intensity exercise for up to 150 minutes weekly; and  sleep for at least 7 hours a day.  I advised patient to maintain close follow up with her PCP for primary care needs.  - Time spent with the patient: 25 min, of which >50% was spent in reviewing her blood glucose logs , discussing her hypo- and hyper-glycemic episodes, reviewing her current and  previous labs and insulin doses and developing a plan to avoid hypo- and hyper-glycemia. Please refer to Patient Instructions for Blood Glucose Monitoring and Insulin/Medications Dosing Guide"  in media tab for additional information. Edison Simon Schorsch participated in the discussions, expressed understanding, and voiced agreement with the above plans.  All questions were answered to her satisfaction. she is encouraged to contact clinic should she have any questions or concerns prior to her return visit.  Follow up plan: Return in about 3 months (around 02/21/2018) for Follow up with Pre-visit Labs, Meter, and Logs.  Marquis Lunch, MD Phone:  [phone]  Fax: [phone]  This note was partially dictated with voice recognition software. Similar  sounding words can be transcribed inadequately or may not  be corrected upon review.  11/21/2017, 1:17 PM

## 2017-11-21 NOTE — Patient Instructions (Signed)
Goals 1. Eat 2-3 carb choices per meal 2. Walk 30 minutes every day 3. Use sliding scale as prescribed. 4. Lose 1 lb per week Cut out snacks, diet sodas and drink only water  Get A1C down to 8%.

## 2017-11-21 NOTE — Patient Instructions (Signed)

## 2018-02-10 ENCOUNTER — Other Ambulatory Visit: Payer: Self-pay | Admitting: "Endocrinology

## 2018-02-22 ENCOUNTER — Ambulatory Visit: Payer: Medicare Other | Admitting: Nutrition

## 2018-02-22 ENCOUNTER — Ambulatory Visit: Payer: Medicare Other | Admitting: "Endocrinology

## 2018-04-12 ENCOUNTER — Encounter: Payer: Self-pay | Admitting: "Endocrinology

## 2018-04-12 ENCOUNTER — Ambulatory Visit (INDEPENDENT_AMBULATORY_CARE_PROVIDER_SITE_OTHER): Payer: Medicare Other | Admitting: "Endocrinology

## 2018-04-12 VITALS — BP 158/99 | HR 80 | Ht 69.0 in | Wt 297.0 lb

## 2018-04-12 DIAGNOSIS — E1165 Type 2 diabetes mellitus with hyperglycemia: Secondary | ICD-10-CM

## 2018-04-12 DIAGNOSIS — IMO0002 Reserved for concepts with insufficient information to code with codable children: Secondary | ICD-10-CM

## 2018-04-12 DIAGNOSIS — E118 Type 2 diabetes mellitus with unspecified complications: Secondary | ICD-10-CM

## 2018-04-12 DIAGNOSIS — I10 Essential (primary) hypertension: Secondary | ICD-10-CM

## 2018-04-12 DIAGNOSIS — E782 Mixed hyperlipidemia: Secondary | ICD-10-CM | POA: Diagnosis not present

## 2018-04-12 DIAGNOSIS — Z794 Long term (current) use of insulin: Secondary | ICD-10-CM

## 2018-04-12 NOTE — Progress Notes (Signed)
Endocrinology follow-up note  Subjective:    Patient ID: Emily Leon, female    DOB: 07-Jan-1951,    Past Medical History:  Diagnosis Date  . Arthritis   . Bronchitis    hx of   . Cataracts, bilateral   . Chronic kidney disease   . Cough   . Diabetes mellitus without complication (HCC)   . Hypertension   . Shortness of breath dyspnea    walking distances or climbing stairs  . Sleep apnea    uses CPAP  . Tingling    toes bilat   . Urinary frequency    Past Surgical History:  Procedure Laterality Date  . ABDOMINAL HYSTERECTOMY    . BREATH TEK H PYLORI N/A 08/26/2014   Procedure: BREATH TEK H PYLORI;  Surgeon: Gaynelle Adu, MD;  Location: [facility];  Service: General;  Laterality: N/A;  . KNEE SURGERY Left 2009  . LAPAROSCOPIC GASTRIC SLEEVE RESECTION N/A 11/11/2014   Procedure: LAPAROSCOPIC GASTRIC SLEEVE RESECTION;  Surgeon: Gaynelle Adu, MD;  Location: [facility];  Service: General;  Laterality: N/A;  . UPPER GI ENDOSCOPY  11/11/2014   Procedure: UPPER GI ENDOSCOPY;  Surgeon: Gaynelle Adu, MD;  Location: [facility];  Service: General;;   Social History   Socioeconomic History  . Marital status: Married    Spouse name: Not on file  . Number of children: Not on file  . Years of education: Not on file  . Highest education level: Not on file  Occupational History  . Not on file  Social Needs  . Financial resource strain: Not on file  . Food insecurity:    Worry: Not on file    Inability: Not on file  . Transportation needs:    Medical: Not on file    Non-medical: Not on file  Tobacco Use  . Smoking status: Never Smoker  . Smokeless tobacco: Never Used  Substance and Sexual Activity  . Alcohol use: No  . Drug use: No  . Sexual activity: Not on file  Lifestyle  . Physical activity:    Days per week: Not on file    Minutes per session: Not on file  . Stress: Not on file  Relationships  . Social connections:    Talks on phone: Not on file    Gets together: Not  on file    Attends religious service: Not on file    Active member of club or organization: Not on file    Attends meetings of clubs or organizations: Not on file    Relationship status: Not on file  Other Topics Concern  . Not on file  Social History Narrative  . Not on file   Outpatient Encounter Medications as of 04/12/2018  Medication Sig  . amLODipine (NORVASC) 10 MG tablet Take 10 mg by mouth every morning.  Marland Kitchen atenolol (TENORMIN) 100 MG tablet Take 100 mg by mouth every morning.   . B-D UF III MINI PEN NEEDLES 31G X 5 MM MISC USE 4 TIMES A DAY AS DIRECTED  . Continuous Blood Gluc Sensor (FREESTYLE LIBRE 14 DAY SENSOR) MISC 1 each by Does not apply route every 14 (fourteen) days.  . ergocalciferol (VITAMIN D2) 50000 UNITS capsule Take 50,000 Units by mouth every Monday.  . ezetimibe-simvastatin (VYTORIN) 10-40 MG tablet TAKE 1 TABLET BY MOUTH AT BEDTIME  . hydrochlorothiazide (HYDRODIURIL) 25 MG tablet Take 12.5 mg by mouth every morning.   . insulin aspart (NOVOLOG FLEXPEN) 100 UNIT/ML FlexPen Inject 25-31 Units  into the skin 3 (three) times daily before meals.  . Insulin Degludec (TRESIBA FLEXTOUCH) 200 UNIT/ML SOPN Inject 100 Units into the skin daily with breakfast.  . loratadine (CLARITIN) 10 MG tablet Take 10 mg by mouth daily.  . metFORMIN (GLUCOPHAGE-XR) 500 MG 24 hr tablet Take 2 tablets (1,000 mg total) by mouth daily with breakfast.  . NOVOLOG FLEXPEN 100 UNIT/ML FlexPen INJECT 30 UNITS THREE TIMES DAILY  . ondansetron (ZOFRAN ODT) 4 MG disintegrating tablet Take 1 tablet (4 mg total) by mouth every 8 (eight) hours as needed for nausea or vomiting.  Marland Kitchen. oxyCODONE (ROXICODONE) 5 MG/5ML solution Take 5-10 mLs (5-10 mg total) by mouth every 4 (four) hours as needed for moderate pain or severe pain.  . pantoprazole (PROTONIX) 40 MG tablet Take 1 tablet (40 mg total) by mouth daily.  . TRESIBA FLEXTOUCH 200 UNIT/ML SOPN INJECT 80 UNITS INTO THE SKIN EVERY MORNING.  . TRULICITY  1.5 MG/0.5ML SOPN INJECT 1.5 MG ONCE A WEEK INTO SKIN  . valsartan (DIOVAN) 160 MG tablet Take 160 mg by mouth every morning.    No facility-administered encounter medications on file as of 04/12/2018.    ALLERGIES: Allergies  Allergen Reactions  . Penicillins Itching and Rash  . Sulfa Antibiotics Itching and Rash   VACCINATION STATUS:  There is no immunization history on file for this patient.  Diabetes  She presents for her follow-up diabetic visit. She has type 2 diabetes mellitus. Onset time: She was diagnosed at approximate age of 27 years. Her disease course has been stable. There are no hypoglycemic associated symptoms. Pertinent negatives for hypoglycemia include no confusion, headaches, pallor or seizures. There are no diabetic associated symptoms. Pertinent negatives for diabetes include no chest pain, no fatigue and no polyphagia. There are no hypoglycemic complications. Symptoms are stable. There are no diabetic complications. Risk factors for coronary artery disease include dyslipidemia, diabetes mellitus, obesity, hypertension and sedentary lifestyle. Current diabetic treatment includes intensive insulin program. She is compliant with treatment some of the time. Her weight is stable. She is following a generally unhealthy diet. When asked about meal planning, she reported none. Prior visit with dietitian: She will be scheduled with a dietitian here. She never participates in exercise. Her home blood glucose trend is decreasing steadily. Her breakfast blood glucose range is generally 180-200 mg/dl. Her lunch blood glucose range is generally 180-200 mg/dl. Her dinner blood glucose range is generally 180-200 mg/dl. Her bedtime blood glucose range is generally >200 mg/dl. Her overall blood glucose range is >200 mg/dl. (Patient skips her Tresiba doses at least 2-3 days a week  due to the fact that she falls asleep from 5 PM.) An ACE inhibitor/angiotensin II receptor blocker is being taken.  Eye exam is current.  Hyperlipidemia  This is a chronic problem. The current episode started more than 1 year ago. The problem is uncontrolled. Exacerbating diseases include diabetes and obesity. Pertinent negatives include no chest pain, myalgias or shortness of breath. Current antihyperlipidemic treatment includes statins. Risk factors for coronary artery disease include diabetes mellitus, dyslipidemia, hypertension, obesity and a sedentary lifestyle.  Hypertension  This is a chronic problem. The current episode started more than 1 year ago. The problem is uncontrolled. Pertinent negatives include no chest pain, headaches, palpitations or shortness of breath. Risk factors for coronary artery disease include dyslipidemia, diabetes mellitus, obesity and sedentary lifestyle. Past treatments include angiotensin blockers.   Review of Systems  Constitutional: Negative for fatigue and unexpected weight change.  HENT:  Negative for trouble swallowing and voice change.   Eyes: Negative for visual disturbance.  Respiratory: Negative for cough, shortness of breath and wheezing.   Cardiovascular: Negative for chest pain, palpitations and leg swelling.  Gastrointestinal: Negative for diarrhea, nausea and vomiting.  Endocrine: Negative for cold intolerance, heat intolerance and polyphagia.  Musculoskeletal: Negative for arthralgias and myalgias.  Skin: Negative for color change, pallor, rash and wound.  Neurological: Negative for seizures and headaches.  Psychiatric/Behavioral: Negative for confusion and suicidal ideas.    Objective:    BP (!) 158/99   Pulse 80   Ht 5\' 9"  (1.753 m)   Wt 297 lb (134.7 kg)   BMI 43.86 kg/m   Wt Readings from Last 3 Encounters:  04/12/18 297 lb (134.7 kg)  11/21/17 290 lb (131.5 kg)  11/21/17 290 lb (131.5 kg)    Physical Exam  Constitutional: She is oriented to person, place, and time. She appears well-developed.  HENT:  Head: Normocephalic and atraumatic.   Eyes: EOM are normal.  Neck: Normal range of motion. Neck supple. No tracheal deviation present. No thyromegaly present.  Cardiovascular: Normal rate.  Pulmonary/Chest: Effort normal.  Abdominal: There is no abdominal tenderness. There is no guarding.  Musculoskeletal: Normal range of motion.        General: No edema.  Neurological: She is alert and oriented to person, place, and time. She has normal reflexes. No cranial nerve deficit. Coordination normal.  Skin: Skin is warm and dry. No rash noted. No erythema. No pallor.  Psychiatric: She has a normal mood and affect. Judgment normal.    Complete Blood Count (Most recent): Lab Results  Component Value Date   WBC 11.9 (H) 11/13/2014   HGB 13.4 11/13/2014   HCT 41.3 11/13/2014   MCV 90.2 11/13/2014   PLT 317 11/13/2014   Chemistry (most recent): Lab Results  Component Value Date   NA 134 (L) 11/12/2014   K 4.5 11/12/2014   CL 99 (L) 11/12/2014   CO2 26 11/12/2014   BUN 14 11/06/2017   CREATININE 1.1 11/06/2017   Diabetic Labs (most recent): Lab Results  Component Value Date   HGBA1C 9.6 11/06/2017   HGBA1C 9.6 06/26/2017   HGBA1C 9.2 03/23/2017    Lipid Panel     Component Value Date/Time   CHOL 187 03/23/2017   TRIG 162 (A) 03/23/2017   HDL 36 03/23/2017   LDLCALC 119 03/23/2017     Assessment & Plan:   1. Uncontrolled type 2 diabetes mellitus with complication, with long-term current use of insulin (HCC)  Her diabetes is  complicated by obesity status post  Sleeve bariatric surgery and patient remains at a high risk for more acute and chronic complications of diabetes which include CAD, CVA, CKD, retinopathy, and neuropathy. These are all discussed in detail with the patient.  Emily Leon came with loss of control her diabetes with A1c of 9.3%. Her most recent glycemic profile is near target.  -No reported nor documented hypoglycemia.   -She admits to dietary indiscretion including consumption of  soda/sweet tea.  - Glucose logs and insulin administration records pertaining to this visit,  to be scanned into patient's records.  Recent labs reviewed.  - I have re-counseled the patient on diet management and weight loss  by adopting a carbohydrate restricted / protein rich  Diet. -Since her surgery she lost 40 pounds, and has stabilized.  -  Suggestion is made for her to avoid simple carbohydrates  from her diet including Cakes,  Sweet Desserts / Pastries, Ice Cream, Soda (diet and regular), Sweet Tea, Candies, Chips, Cookies, Store Bought Juices, Alcohol in Excess of  1-2 drinks a day, Artificial Sweeteners, and "Sugar-free" Products. This will help patient to have stable blood glucose profile and potentially avoid unintended weight gain.   - Patient is advised to stick to a routine mealtimes to eat 3 meals  a day and avoid unnecessary snacks ( to snack only to correct hypoglycemia).   - I have approached patient with the following individualized plan to manage diabetes and patient agrees.  She is s/p sleeve GBP, EAG from her Libre CGM is higher at 186 than before.  - Emphasizing compliance on consistency in  timing of   meals and insulin ,  she is advised to switch her Evaristo Buryresiba to 5 PM every day to avoid skipping some doses.  -She is advised to continue NovoLog 25 units 3 times a day before breakfast, lunch, and supper for pre-meal blood glucose readings above 90 mg/dL, plus patient specific correction for pre-meal blood glucose readings above 150 mg/dL.   -She is encouraged to use her CGM device at all times.  -If she continues to require higher dose of U100 insulin above 200 units a day, she will be considered for insulin U500 on subsequent visits.  - I advised her to document blood glucose at least 4 times a day, before meals and at bedtime.  - She is warned not to take insulin without documenting blood glucose off of the sensor.  -She tolerated Glumetza , I advised her to continue   1000 mg  Glumetza extended release once a day.  -She is advised to continue Trulicity 1.5 mg subcutaneously weekly.   - Patient specific target  for A1c; LDL, HDL, Triglycerides, and  Waist Circumference were discussed in detail.  2) BP/HTN: Her blood pressure is not controlled to target.  She is advised to continue her current blood pressure medications including hydrochlorothiazide 25 mg p.o. daily, valsartan 160 mg p.o. daily.    3) Lipids/HPL: Uncontrolled with LDL of 119.  She is advised to continue continue Vytorin. 4)  Weight/Diet:    She continues to gain weight despite her history of sleeve gastrectomy, patient is encouraged to resume follow-up with her dietitian.  She will benefit from further striction of caloric intake.   - exercise, and carbohydrates information provided.  5) Chronic Care/Health Maintenance:  -Patient is on ACEI/ARB and Statin medications and encouraged to continue to follow up with Ophthalmology, Podiatrist at least yearly or according to recommendations, and advised to  stay away from smoking. I have recommended yearly flu vaccine and pneumonia vaccination at least every 5 years; moderate intensity exercise for up to 150 minutes weekly; and  sleep for at least 7 hours a day.  I advised patient to maintain close follow up with her PCP for primary care needs.  - Time spent with the patient: 25 min, of which >50% was spent in reviewing her blood glucose logs , discussing her hypo- and hyper-glycemic episodes, reviewing her current and  previous labs and insulin doses and developing a plan to avoid hypo- and hyper-glycemia. Please refer to Patient Instructions for Blood Glucose Monitoring and Insulin/Medications Dosing Guide"  in media tab for additional information. Edison SimonBarbaretta S Langenfeld participated in the discussions, expressed understanding, and voiced agreement with the above plans.  All questions were answered to her satisfaction. she is encouraged to contact clinic  should she have any questions or concerns prior  to her return visit.   Follow up plan: Return in about 3 months (around 07/12/2018) for Follow up with Pre-visit Labs, Meter, and Logs.  Marquis Lunch, MD Phone: [phone]  Fax: [phone]  This note was partially dictated with voice recognition software. Similar sounding words can be transcribed inadequately or may not  be corrected upon review.  04/12/2018, 11:03 AM

## 2018-04-12 NOTE — Patient Instructions (Signed)

## 2018-04-16 ENCOUNTER — Other Ambulatory Visit: Payer: Self-pay | Admitting: "Endocrinology

## 2018-04-24 ENCOUNTER — Other Ambulatory Visit: Payer: Self-pay | Admitting: "Endocrinology

## 2018-06-07 ENCOUNTER — Other Ambulatory Visit: Payer: Self-pay | Admitting: "Endocrinology

## 2018-07-10 LAB — BASIC METABOLIC PANEL
BUN: 13 (ref 4–21)
Creatinine: 1.2 — AB (ref 0.5–1.1)

## 2018-07-10 LAB — HEMOGLOBIN A1C: Hemoglobin A1C: 9.7

## 2018-07-10 LAB — MICROALBUMIN, URINE: Microalb, Ur: 11.5

## 2018-07-10 LAB — VITAMIN D 25 HYDROXY (VIT D DEFICIENCY, FRACTURES): Vit D, 25-Hydroxy: 27

## 2018-07-13 ENCOUNTER — Encounter: Payer: Self-pay | Admitting: "Endocrinology

## 2018-07-13 ENCOUNTER — Ambulatory Visit (INDEPENDENT_AMBULATORY_CARE_PROVIDER_SITE_OTHER): Payer: Medicare Other | Admitting: "Endocrinology

## 2018-07-13 DIAGNOSIS — Z794 Long term (current) use of insulin: Secondary | ICD-10-CM

## 2018-07-13 DIAGNOSIS — E782 Mixed hyperlipidemia: Secondary | ICD-10-CM | POA: Diagnosis not present

## 2018-07-13 DIAGNOSIS — E1165 Type 2 diabetes mellitus with hyperglycemia: Secondary | ICD-10-CM

## 2018-07-13 DIAGNOSIS — E118 Type 2 diabetes mellitus with unspecified complications: Principal | ICD-10-CM

## 2018-07-13 DIAGNOSIS — IMO0002 Reserved for concepts with insufficient information to code with codable children: Secondary | ICD-10-CM

## 2018-07-13 MED ORDER — INSULIN ASPART 100 UNIT/ML FLEXPEN: 30.0000 [IU] | PEN_INJECTOR | Freq: Three times a day (TID) | SUBCUTANEOUS | 3 refills | Status: DC

## 2018-07-13 NOTE — Progress Notes (Signed)
Endocrinology Telephone Visit Follow up Note -During COVID -19 Pandemic   Subjective:    Patient ID: Emily Leon, female    DOB: 11-11-1950,    Past Medical History:  Diagnosis Date  . Arthritis   . Bronchitis    hx of   . Cataracts, bilateral   . Chronic kidney disease   . Cough   . Diabetes mellitus without complication (HCC)   . Hypertension   . Shortness of breath dyspnea    walking distances or climbing stairs  . Sleep apnea    uses CPAP  . Tingling    toes bilat   . Urinary frequency    Past Surgical History:  Procedure Laterality Date  . ABDOMINAL HYSTERECTOMY    . BREATH TEK H PYLORI N/A 08/26/2014   Procedure: BREATH TEK H PYLORI;  Surgeon: Gaynelle Adu, MD;  Location: [facility];  Service: General;  Laterality: N/A;  . KNEE SURGERY Left 2009  . LAPAROSCOPIC GASTRIC SLEEVE RESECTION N/A 11/11/2014   Procedure: LAPAROSCOPIC GASTRIC SLEEVE RESECTION;  Surgeon: Gaynelle Adu, MD;  Location: [facility];  Service: General;  Laterality: N/A;  . UPPER GI ENDOSCOPY  11/11/2014   Procedure: UPPER GI ENDOSCOPY;  Surgeon: Gaynelle Adu, MD;  Location: [facility];  Service: General;;   Social History   Socioeconomic History  . Marital status: Married    Spouse name: Not on file  . Number of children: Not on file  . Years of education: Not on file  . Highest education level: Not on file  Occupational History  . Not on file  Social Needs  . Financial resource strain: Not on file  . Food insecurity:    Worry: Not on file    Inability: Not on file  . Transportation needs:    Medical: Not on file    Non-medical: Not on file  Tobacco Use  . Smoking status: Never Smoker  . Smokeless tobacco: Never Used  Substance and Sexual Activity  . Alcohol use: No  . Drug use: No  . Sexual activity: Not on file  Lifestyle  . Physical activity:    Days per week: Not on file    Minutes per session: Not on file  . Stress: Not on  file  Relationships  . Social connections:    Talks on phone: Not on file    Gets together: Not on file    Attends religious service: Not on file    Active member of club or organization: Not on file    Attends meetings of clubs or organizations: Not on file    Relationship status: Not on file  Other Topics Concern  . Not on file  Social History Narrative  . Not on file   Outpatient Encounter Medications as of 07/13/2018  Medication Sig  . amLODipine (NORVASC) 10 MG tablet Take 10 mg by mouth every morning.  Marland Kitchen atenolol (TENORMIN) 100 MG tablet Take 100 mg by mouth every morning.   . B-D UF III MINI PEN NEEDLES 31G X 5 MM MISC USE 4 TIMES A DAY AS DIRECTED  . Continuous Blood Gluc Sensor (FREESTYLE LIBRE 14 DAY SENSOR) MISC 1 each by Does not apply route every  14 (fourteen) days.  . ergocalciferol (VITAMIN D2) 50000 UNITS capsule Take 50,000 Units by mouth every Monday.  . ezetimibe-simvastatin (VYTORIN) 10-40 MG tablet TAKE 1 TABLET BY MOUTH AT BEDTIME  . hydrochlorothiazide (HYDRODIURIL) 25 MG tablet Take 12.5 mg by mouth every morning.   . insulin aspart (NOVOLOG FLEXPEN) 100 UNIT/ML FlexPen Inject 30-36 Units into the skin 3 (three) times daily before meals.  . Insulin Degludec 200 UNIT/ML SOPN Inject 100 Units into the skin every morning.  . loratadine (CLARITIN) 10 MG tablet Take 10 mg by mouth daily.  . metFORMIN (GLUCOPHAGE-XR) 500 MG 24 hr tablet Take 2 tablets (1,000 mg total) by mouth daily with breakfast.  . ondansetron (ZOFRAN ODT) 4 MG disintegrating tablet Take 1 tablet (4 mg total) by mouth every 8 (eight) hours as needed for nausea or vomiting.  Marland Kitchen oxyCODONE (ROXICODONE) 5 MG/5ML solution Take 5-10 mLs (5-10 mg total) by mouth every 4 (four) hours as needed for moderate pain or severe pain.  . pantoprazole (PROTONIX) 40 MG tablet Take 1 tablet (40 mg total) by mouth daily.  . TRULICITY 1.5 MG/0.5ML SOPN INJECT 1.5 MG ONCE A WEEK INTO SKIN  . valsartan (DIOVAN) 160 MG  tablet Take 160 mg by mouth every morning.   . [DISCONTINUED] insulin aspart (NOVOLOG FLEXPEN) 100 UNIT/ML FlexPen Inject 25-31 Units into the skin 3 (three) times daily before meals.  . [DISCONTINUED] NOVOLOG FLEXPEN 100 UNIT/ML FlexPen INJECT 30 UNITS THREE TIMES DAILY  . [DISCONTINUED] TRESIBA FLEXTOUCH 200 UNIT/ML SOPN INJECT 80 UNITS INTO THE SKIN EVERY MORNING.   No facility-administered encounter medications on file as of 07/13/2018.    ALLERGIES: Allergies  Allergen Reactions  . Penicillins Itching and Rash  . Sulfa Antibiotics Itching and Rash   VACCINATION STATUS:  There is no immunization history on file for this patient.  Diabetes  She presents for her follow-up diabetic visit. She has type 2 diabetes mellitus. Onset time: She was diagnosed at approximate age of 27 years. Her disease course has been worsening. There are no hypoglycemic associated symptoms. Pertinent negatives for hypoglycemia include no confusion, pallor or seizures. There are no diabetic associated symptoms. Pertinent negatives for diabetes include no fatigue and no polyphagia. There are no hypoglycemic complications. Symptoms are worsening. There are no diabetic complications. Risk factors for coronary artery disease include dyslipidemia, diabetes mellitus, obesity, hypertension and sedentary lifestyle. Current diabetic treatment includes intensive insulin program. She is compliant with treatment some of the time. She is following a generally unhealthy diet. When asked about meal planning, she reported none. Prior visit with dietitian: She will be scheduled with a dietitian here. She never participates in exercise. Her home blood glucose trend is decreasing steadily. Her breakfast blood glucose range is generally 180-200 mg/dl. Her lunch blood glucose range is generally >200 mg/dl. Her dinner blood glucose range is generally >200 mg/dl. Her bedtime blood glucose range is generally >200 mg/dl. Her overall blood glucose  range is >200 mg/dl. (She reports before breakfast readings between 165 and 171, before lunch readings between 213 and 328, before supper blood glucose readings between 154 and 260, bedtime blood glucose readings between 134 and 226.  Her previsit labs show A1c of 9.7% increasing from 9.3%.) An ACE inhibitor/angiotensin II receptor blocker is being taken. Eye exam is current.  Hyperlipidemia  This is a chronic problem. The current episode started more than 1 year ago. The problem is uncontrolled. Exacerbating diseases include diabetes and obesity. Pertinent negatives include no myalgias. Current antihyperlipidemic  treatment includes statins. Risk factors for coronary artery disease include diabetes mellitus, dyslipidemia, hypertension, obesity and a sedentary lifestyle.     Objective:    There were no vitals taken for this visit.  Wt Readings from Last 3 Encounters:  04/12/18 297 lb (134.7 kg)  11/21/17 290 lb (131.5 kg)  11/21/17 290 lb (131.5 kg)     Complete Blood Count (Most recent): Lab Results  Component Value Date   WBC 11.9 (H) 11/13/2014   HGB 13.4 11/13/2014   HCT 41.3 11/13/2014   MCV 90.2 11/13/2014   PLT 317 11/13/2014   Chemistry (most recent): Lab Results  Component Value Date   NA 134 (L) 11/12/2014   K 4.5 11/12/2014   CL 99 (L) 11/12/2014   CO2 26 11/12/2014   BUN 13 07/10/2018   CREATININE 1.2 (A) 07/10/2018   Diabetic Labs (most recent): Lab Results  Component Value Date   HGBA1C 9.7 07/10/2018   HGBA1C 9.6 11/06/2017   HGBA1C 9.6 06/26/2017    Lipid Panel     Component Value Date/Time   CHOL 187 03/23/2017   TRIG 162 (A) 03/23/2017   HDL 36 03/23/2017   LDLCALC 119 03/23/2017     Assessment & Plan:   1. Uncontrolled type 2 diabetes mellitus with complication, with long-term current use of insulin (HCC)  Her diabetes is  complicated by obesity status post  Sleeve bariatric surgery and patient remains at a high risk for more acute and chronic  complications of diabetes which include CAD, CVA, CKD, retinopathy, and neuropathy. These are all discussed in detail with the patient.  -Emily Leon reports significantly above target blood glucose readings, particularly her postprandial blood glucose readings.  Her previsit labs show A1c of 9.7% increasing from 9.3%.    - No reported nor documented hypoglycemia.   - Patient admits there is a room for improvement in her diet and drink choices. -  Suggestion is made for her to avoid simple carbohydrates  from her diet including Cakes, Sweet Desserts / Pastries, Ice Cream, Soda (diet and regular), Sweet Tea, Candies, Chips, Cookies, Store Bought Juices, Alcohol in Excess of  1-2 drinks a day, Artificial Sweeteners, and "Sugar-free" Products. This will help patient to have stable blood glucose profile and potentially avoid unintended weight gain.   - I have approached patient with the following individualized plan to manage diabetes and patient agrees.  She is s/p sleeve GBP, EAG from her Libre CGM is higher at 186 than before.  - She will continue Tresiba 100 units at 5 PM every day to avoid skipping some doses. -She is advised to increase NovoLog to 30 units 3 times a day before breakfast, lunch, and supper for pre-meal blood glucose readings above 90 mg/dL, plus patient specific correction for pre-meal blood glucose readings above 150 mg/dL.   -She is encouraged to use her CGM device at all times.  -If she continues to require higher dose of U100 insulin above 200 units a day, she will be considered for insulin U500 on subsequent visits.  - I advised her to document blood glucose at least 4 times a day, before meals and at bedtime.  - She is warned not to take insulin without documenting blood glucose off of the sensor.  -She is advised to continue Glumetza 1000 mg extended release 1 time a day with breakfast.    -She is advised to continue Trulicity 1.5 mg subcutaneously weekly.     2)  Lipids/HPL: Uncontrolled with LDL  of 119.  She is advised to continue continue Vytorin.  I advised patient to maintain close follow up with her PCP for primary care needs.  - Patient Care Time Today:  25 min, of which >50% was spent in reviewing her  current and  previous labs/studies, previous treatments, and medications doses and developing a plan for long-term care based on the latest recommendations for standards of care.  Edison Simon Spangle participated in the discussions, expressed understanding, and voiced agreement with the above plans.  All questions were answered to her satisfaction. she is encouraged to contact clinic should she have any questions or concerns prior to her return visit.   Follow up plan: Return in about 4 months (around 11/12/2018).  Marquis Lunch, MD Phone: [phone]  Fax: [phone]  This note was partially dictated with voice recognition software. Similar sounding words can be transcribed inadequately or may not  be corrected upon review.  07/13/2018, 10:44 AM

## 2018-08-08 ENCOUNTER — Telehealth: Payer: Self-pay | Admitting: "Endocrinology

## 2018-08-08 NOTE — Telephone Encounter (Signed)
All required information sent to [facility] for freestyle [facility]. Fax confirmation received.

## 2018-10-05 ENCOUNTER — Other Ambulatory Visit: Payer: Self-pay | Admitting: "Endocrinology

## 2018-11-12 ENCOUNTER — Ambulatory Visit: Payer: Medicare Other | Admitting: "Endocrinology

## 2018-11-22 LAB — BASIC METABOLIC PANEL
BUN: 11 (ref 4–21)
Creatinine: 1.2 — AB (ref 0.5–1.1)

## 2018-11-22 LAB — HEMOGLOBIN A1C: Hemoglobin A1C: 8.8

## 2018-11-28 ENCOUNTER — Encounter: Payer: Self-pay | Admitting: "Endocrinology

## 2018-11-28 ENCOUNTER — Other Ambulatory Visit: Payer: Self-pay

## 2018-11-28 ENCOUNTER — Ambulatory Visit (INDEPENDENT_AMBULATORY_CARE_PROVIDER_SITE_OTHER): Payer: Medicare Other | Admitting: "Endocrinology

## 2018-11-28 DIAGNOSIS — E782 Mixed hyperlipidemia: Secondary | ICD-10-CM | POA: Diagnosis not present

## 2018-11-28 DIAGNOSIS — Z794 Long term (current) use of insulin: Secondary | ICD-10-CM

## 2018-11-28 DIAGNOSIS — E118 Type 2 diabetes mellitus with unspecified complications: Secondary | ICD-10-CM

## 2018-11-28 DIAGNOSIS — IMO0002 Reserved for concepts with insufficient information to code with codable children: Secondary | ICD-10-CM

## 2018-11-28 DIAGNOSIS — E1165 Type 2 diabetes mellitus with hyperglycemia: Secondary | ICD-10-CM

## 2018-11-28 DIAGNOSIS — I10 Essential (primary) hypertension: Secondary | ICD-10-CM | POA: Diagnosis not present

## 2018-11-28 NOTE — Progress Notes (Signed)
11/28/2018                                                    Endocrinology Telehealth Visit Follow up Note -During COVID -19 Pandemic  This visit type was conducted due to national recommendations for restrictions regarding the COVID-19 Pandemic  in an effort to limit this patient's exposure and mitigate transmission of the corona virus.  Due to her co-morbid illnesses, Emily Leon is at  moderate to high risk for complications without adequate follow up.  This format is felt to be most appropriate for her at this time.  I connected with this patient on 11/28/2018   by telephone and verified that I am speaking with the correct person using two identifiers. Emily Leon, December 13, 1950. she has verbally consented to this visit. All issues noted in this document were discussed and addressed. The format was not optimal for physical exam.     Subjective:    Patient ID: Emily Leon, female    DOB: December 13, 1950,    Past Medical History:  Diagnosis Date  . Arthritis   . Bronchitis    hx of   . Cataracts, bilateral   . Chronic kidney disease   . Cough   . Diabetes mellitus without complication (HCC)   . Hypertension   . Shortness of breath dyspnea    walking distances or climbing stairs  . Sleep apnea    uses CPAP  . Tingling    toes bilat   . Urinary frequency    Past Surgical History:  Procedure Laterality Date  . ABDOMINAL HYSTERECTOMY    . BREATH TEK H PYLORI N/A 08/26/2014   Procedure: BREATH TEK H PYLORI;  Surgeon: Gaynelle AduEric Wilson, MD;  [facility];  Service: General;  Laterality: N/A;  . KNEE SURGERY Left 2009  . LAPAROSCOPIC GASTRIC SLEEVE RESECTION N/A 11/11/2014   Procedure: LAPAROSCOPIC GASTRIC SLEEVE RESECTION;  Surgeon: Gaynelle AduEric Wilson, MD;  Location: [facility];  Service: General;  Laterality: N/A;  . UPPER GI ENDOSCOPY  11/11/2014   Procedure: UPPER GI ENDOSCOPY;  Surgeon: Gaynelle AduEric Wilson, MD;  Location: [facility];  Service: General;;   Social History    Socioeconomic History  . Marital status: Married    Spouse name: Not on file  . Number of children: Not on file  . Years of education: Not on file  . Highest education level: Not on file  Occupational History  . Not on file  Social Needs  . Financial resource strain: Not on file  . Food insecurity    Worry: Not on file    Inability: Not on file  . Transportation needs    Medical: Not on file    Non-medical: Not on file  Tobacco Use  . Smoking status: Never Smoker  . Smokeless tobacco: Never Used  Substance and Sexual Activity  . Alcohol use: No  . Drug use: No  . Sexual activity: Not on file  Lifestyle  . Physical activity    Days per week: Not on file    Minutes per session: Not on file  . Stress: Not on file  Relationships  . Social Musicianconnections    Talks on phone: Not on file    Gets together: Not on file    Attends religious service: Not on file    Active  member of club or organization: Not on file    Attends meetings of clubs or organizations: Not on file    Relationship status: Not on file  Other Topics Concern  . Not on file  Social History Narrative  . Not on file   Outpatient Encounter Medications as of 11/28/2018  Medication Sig  . amLODipine (NORVASC) 10 MG tablet Take 10 mg by mouth every morning.  Marland Kitchen atenolol (TENORMIN) 100 MG tablet Take 100 mg by mouth every morning.   . B-D UF III MINI PEN NEEDLES 31G X 5 MM MISC USE 4 TIMES A DAY AS DIRECTED  . Continuous Blood Gluc Sensor (FREESTYLE LIBRE 14 DAY SENSOR) MISC 1 each by Does not apply route every 14 (fourteen) days.  . ergocalciferol (VITAMIN D2) 50000 UNITS capsule Take 50,000 Units by mouth every Monday.  . ezetimibe-simvastatin (VYTORIN) 10-40 MG tablet TAKE 1 TABLET BY MOUTH AT BEDTIME  . hydrochlorothiazide (HYDRODIURIL) 25 MG tablet Take 12.5 mg by mouth every morning.   . Insulin Degludec 200 UNIT/ML SOPN Inject 100 Units into the skin every morning.  . loratadine (CLARITIN) 10 MG tablet Take  10 mg by mouth daily.  . metFORMIN (GLUCOPHAGE-XR) 500 MG 24 hr tablet Take 2 tablets (1,000 mg total) by mouth daily with breakfast.  . NOVOLOG FLEXPEN 100 UNIT/ML FlexPen INJECT 30 UNITS SUBQ 3 TIMES DAILY  . ondansetron (ZOFRAN ODT) 4 MG disintegrating tablet Take 1 tablet (4 mg total) by mouth every 8 (eight) hours as needed for nausea or vomiting.  Marland Kitchen oxyCODONE (ROXICODONE) 5 MG/5ML solution Take 5-10 mLs (5-10 mg total) by mouth every 4 (four) hours as needed for moderate pain or severe pain.  . pantoprazole (PROTONIX) 40 MG tablet Take 1 tablet (40 mg total) by mouth daily.  . TRULICITY 1.5 MV/6.7MC SOPN INJECT 1.5 MG ONCE A WEEK INTO SKIN  . valsartan (DIOVAN) 160 MG tablet Take 160 mg by mouth every morning.    No facility-administered encounter medications on file as of 11/28/2018.    ALLERGIES: Allergies  Allergen Reactions  . Penicillins Itching and Rash  . Sulfa Antibiotics Itching and Rash   VACCINATION STATUS:  There is no immunization history on file for this patient.  Diabetes She presents for her follow-up diabetic visit. She has type 2 diabetes mellitus. Onset time: She was diagnosed at approximate age of 57 years. Her disease course has been improving. There are no hypoglycemic associated symptoms. Pertinent negatives for hypoglycemia include no confusion, pallor or seizures. There are no diabetic associated symptoms. Pertinent negatives for diabetes include no fatigue and no polyphagia. There are no hypoglycemic complications. Symptoms are improving. There are no diabetic complications. Risk factors for coronary artery disease include dyslipidemia, diabetes mellitus, obesity, hypertension and sedentary lifestyle. Current diabetic treatment includes intensive insulin program. She is compliant with treatment some of the time. She is following a generally unhealthy diet. When asked about meal planning, she reported none. Prior visit with dietitian: She will be scheduled with a  dietitian here. She never participates in exercise. Her home blood glucose trend is decreasing steadily. Her breakfast blood glucose range is generally 180-200 mg/dl. Her lunch blood glucose range is generally >200 mg/dl. Her dinner blood glucose range is generally >200 mg/dl. Her bedtime blood glucose range is generally >200 mg/dl. Her overall blood glucose range is >200 mg/dl. (She reports before breakfast readings between 165 and 171, before lunch readings between 213 and 328, before supper blood glucose readings between 154 and 260,  bedtime blood glucose readings between 134 and 226.  Her previsit labs show A1c of 9.7% increasing from 9.3%.) An ACE inhibitor/angiotensin II receptor blocker is being taken. Eye exam is current.  Hyperlipidemia This is a chronic problem. The current episode started more than 1 year ago. The problem is uncontrolled. Exacerbating diseases include diabetes and obesity. Pertinent negatives include no myalgias. Current antihyperlipidemic treatment includes statins. Risk factors for coronary artery disease include diabetes mellitus, dyslipidemia, hypertension, obesity and a sedentary lifestyle.     Objective:    There were no vitals taken for this visit.  Wt Readings from Last 3 Encounters:  04/12/18 297 lb (134.7 kg)  11/21/17 290 lb (131.5 kg)  11/21/17 290 lb (131.5 kg)     Complete Blood Count (Most recent): Lab Results  Component Value Date   WBC 11.9 (H) 11/13/2014   HGB 13.4 11/13/2014   HCT 41.3 11/13/2014   MCV 90.2 11/13/2014   PLT 317 11/13/2014   Chemistry (most recent): Lab Results  Component Value Date   NA 134 (L) 11/12/2014   K 4.5 11/12/2014   CL 99 (L) 11/12/2014   CO2 26 11/12/2014   BUN 11 11/22/2018   CREATININE 1.2 (A) 11/22/2018   Diabetic Labs (most recent): Lab Results  Component Value Date   HGBA1C 8.8 11/22/2018   HGBA1C 9.7 07/10/2018   HGBA1C 9.6 11/06/2017    Lipid Panel     Component Value Date/Time   CHOL 187  03/23/2017   TRIG 162 (A) 03/23/2017   HDL 36 03/23/2017   LDLCALC 119 03/23/2017     Assessment & Plan:   1. Uncontrolled type 2 diabetes mellitus with complication, with long-term current use of insulin (HCC)  Her diabetes is  complicated by obesity status post  Sleeve bariatric surgery and patient remains at a high risk for more acute and chronic complications of diabetes which include CAD, CVA, CKD, retinopathy, and neuropathy. These are all discussed in detail with the patient.  -Britta MccreedyBarbara reports significantly better glycemic profile and A1c of 8.8% improving from 9.7%.    - No reported nor documented hypoglycemia.   - she  admits there is a room for improvement in her diet and drink choices. -  Suggestion is made for her to avoid simple carbohydrates  from her diet including Cakes, Sweet Desserts / Pastries, Ice Cream, Soda (diet and regular), Sweet Tea, Candies, Chips, Cookies, Sweet Pastries,  Store Bought Juices, Alcohol in Excess of  1-2 drinks a day, Artificial Sweeteners, Coffee Creamer, and "Sugar-free" Products. This will help patient to have stable blood glucose profile and potentially avoid unintended weight gain.  - I have approached patient with the following individualized plan to manage diabetes and patient agrees.  She is s/p sleeve GBP, EAG from her Libre CGM is higher at 186 than before.  - She he is advised to continue Tresiba 100 units daily at 5 PM every day to avoid skipping some doses. -She is advised to continue NovoLog to 30 units 3 times a day before breakfast, lunch, and supper for pre-meal blood glucose readings above 90 mg/dL, plus patient specific correction for pre-meal blood glucose readings above 150 mg/dL.   -She is encouraged to use her CGM device at all times.  -If she continues to require higher dose of U100 insulin above 200 units a day, she will be considered for insulin U500 on subsequent visits.  - I advised her to document blood glucose at  least 4 times a  day, before meals and at bedtime.  - She is warned not to take insulin without documenting blood glucose off of the sensor.  -She is advised to continue Glumetza 1000 mg extended release 1 time a day with breakfast.    -She is advised to continue Trulicity 1.5 mg subcutaneously weekly.    2) hypertension:she is advised to home monitor blood pressure and report if > 140/90 on 2 separate readings.  She is advised to continue her current blood pressure medications including valsartan 160 mg p.o. daily in the morning with breakfast.    3) Lipids/HPL: Uncontrolled with LDL of 119.  She is advised to continue Vytorin.  She is advised to continue continue Vytorin.  I advised patient to maintain close follow up with her PCP for primary care needs.  - Patient Care Time Today:  25 min, of which >50% was spent in  counseling and the rest reviewing her  current and  previous labs/studies, previous treatments, her blood glucose readings, and medications' doses and developing a plan for long-term care based on the latest recommendations for standards of care.   Emily SimonBarbaretta S Tuckey participated in the discussions, expressed understanding, and voiced agreement with the above plans.  All questions were answered to her satisfaction. she is encouraged to contact clinic should she have any questions or concerns prior to her return visit.    Follow up plan: Return in about 4 months (around 03/30/2019), or logs 8, for A1c in Office- Bring Meter and logs, Bring Meter and Logs- A1c in Office.  Marquis LunchGebre Erikson Danzy, MD Phone: [phone]  Fax: [phone]  This note was partially dictated with voice recognition software. Similar sounding words can be transcribed inadequately or may not  be corrected upon review.  11/28/2018, 5:36 PM

## 2018-12-12 ENCOUNTER — Other Ambulatory Visit: Payer: Self-pay | Admitting: "Endocrinology

## 2019-01-07 ENCOUNTER — Other Ambulatory Visit: Payer: Self-pay | Admitting: "Endocrinology

## 2019-02-22 ENCOUNTER — Other Ambulatory Visit: Payer: Self-pay | Admitting: "Endocrinology

## 2019-04-05 ENCOUNTER — Ambulatory Visit: Payer: Medicare Other | Admitting: "Endocrinology

## 2019-04-13 ENCOUNTER — Other Ambulatory Visit: Payer: Self-pay | Admitting: "Endocrinology

## 2019-04-16 ENCOUNTER — Other Ambulatory Visit: Payer: Self-pay | Admitting: "Endocrinology

## 2019-04-25 ENCOUNTER — Other Ambulatory Visit: Payer: Self-pay

## 2019-04-25 ENCOUNTER — Encounter: Payer: Self-pay | Admitting: "Endocrinology

## 2019-04-25 ENCOUNTER — Ambulatory Visit: Payer: Medicare Other | Admitting: "Endocrinology

## 2019-04-25 VITALS — BP 132/82 | HR 75 | Ht 69.0 in | Wt 282.0 lb

## 2019-04-25 DIAGNOSIS — Z794 Long term (current) use of insulin: Secondary | ICD-10-CM | POA: Diagnosis not present

## 2019-04-25 DIAGNOSIS — I10 Essential (primary) hypertension: Secondary | ICD-10-CM

## 2019-04-25 DIAGNOSIS — E1165 Type 2 diabetes mellitus with hyperglycemia: Secondary | ICD-10-CM | POA: Diagnosis not present

## 2019-04-25 DIAGNOSIS — E118 Type 2 diabetes mellitus with unspecified complications: Secondary | ICD-10-CM

## 2019-04-25 DIAGNOSIS — IMO0002 Reserved for concepts with insufficient information to code with codable children: Secondary | ICD-10-CM

## 2019-04-25 DIAGNOSIS — E782 Mixed hyperlipidemia: Secondary | ICD-10-CM

## 2019-04-25 LAB — POCT GLYCOSYLATED HEMOGLOBIN (HGB A1C): Hemoglobin A1C: 9.1 % — AB (ref 4.0–5.6)

## 2019-04-25 MED ORDER — NOVOLOG FLEXPEN 100 UNIT/ML ~~LOC~~ SOPN: 30.0000 [IU] | PEN_INJECTOR | Freq: Three times a day (TID) | SUBCUTANEOUS | 2 refills | Status: DC

## 2019-04-25 NOTE — Progress Notes (Signed)
04/25/2019   Endocrinology follow-up note  Subjective:    Patient ID: Emily Leon, female    DOB: 07/22/1950,    Past Medical History:  Diagnosis Date  . Arthritis   . Bronchitis    hx of   . Cataracts, bilateral   . Chronic kidney disease   . Cough   . Diabetes mellitus without complication (HCC)   . Hypertension   . Shortness of breath dyspnea    walking distances or climbing stairs  . Sleep apnea    uses CPAP  . Tingling    toes bilat   . Urinary frequency    Past Surgical History:  Procedure Laterality Date  . ABDOMINAL HYSTERECTOMY    . BREATH TEK H PYLORI N/A 08/26/2014   Procedure: BREATH TEK H PYLORI;  Surgeon: Gaynelle Adu, MD;  Location: [facility];  Service: General;  Laterality: N/A;  . KNEE SURGERY Left 2009  . LAPAROSCOPIC GASTRIC SLEEVE RESECTION N/A 11/11/2014   Procedure: LAPAROSCOPIC GASTRIC SLEEVE RESECTION;  Surgeon: Gaynelle Adu, MD;  Location: [facility];  Service: General;  Laterality: N/A;  . UPPER GI ENDOSCOPY  11/11/2014   Procedure: UPPER GI ENDOSCOPY;  Surgeon: Gaynelle Adu, MD;  Location: [facility];  Service: General;;   Social History   Socioeconomic History  . Marital status: Married    Spouse name: Not on file  . Number of children: Not on file  . Years of education: Not on file  . Highest education level: Not on file  Occupational History  . Not on file  Tobacco Use  . Smoking status: Never Smoker  . Smokeless tobacco: Never Used  Substance and Sexual Activity  . Alcohol use: No  . Drug use: No  . Sexual activity: Not on file  Other Topics Concern  . Not on file  Social History Narrative  . Not on file   Social Determinants of Health   Financial Resource Strain:   . Difficulty of Paying Living Expenses: Not on file  Food Insecurity:   . Worried About Programme researcher, broadcasting/film/video in the Last Year: Not on file  . Ran Out of Food in the Last Year: Not on file  Transportation Needs:   . Lack of Transportation (Medical): Not on file   . Lack of Transportation (Non-Medical): Not on file  Physical Activity:   . Days of Exercise per Week: Not on file  . Minutes of Exercise per Session: Not on file  Stress:   . Feeling of Stress : Not on file  Social Connections:   . Frequency of Communication with Friends and Family: Not on file  . Frequency of Social Gatherings with Friends and Family: Not on file  . Attends Religious Services: Not on file  . Active Member of Clubs or Organizations: Not on file  . Attends Banker Meetings: Not on file  . Marital Status: Not on file   Outpatient Encounter Medications as of 04/25/2019  Medication Sig  . amLODipine (NORVASC) 10 MG tablet Take 10 mg by mouth every morning.  Marland Kitchen atenolol (TENORMIN) 100 MG tablet Take 100 mg by mouth every morning.   . B-D UF III MINI PEN NEEDLES 31G X 5 MM MISC USE 4 TIMES DAILY AS DIRECTED  . Continuous Blood Gluc Sensor (FREESTYLE LIBRE 14 DAY SENSOR) MISC 1 each by Does not apply route every 14 (fourteen) days.  . ergocalciferol (VITAMIN D2) 50000 UNITS capsule Take 50,000 Units by mouth every Monday.  Marland Kitchen  ezetimibe-simvastatin (VYTORIN) 10-40 MG tablet TAKE 1 TABLET BY MOUTH AT BEDTIME  . hydrochlorothiazide (HYDRODIURIL) 25 MG tablet Take 12.5 mg by mouth every morning.   . insulin aspart (NOVOLOG FLEXPEN) 100 UNIT/ML FlexPen Inject 30-36 Units into the skin 3 (three) times daily with meals.  Marland Kitchen loratadine (CLARITIN) 10 MG tablet Take 10 mg by mouth daily.  . metFORMIN (GLUCOPHAGE-XR) 500 MG 24 hr tablet TAKE 2 TABLETS BY MOUTH EVERY DAY WITH BREAKFAST  . ondansetron (ZOFRAN ODT) 4 MG disintegrating tablet Take 1 tablet (4 mg total) by mouth every 8 (eight) hours as needed for nausea or vomiting.  Marland Kitchen oxyCODONE (ROXICODONE) 5 MG/5ML solution Take 5-10 mLs (5-10 mg total) by mouth every 4 (four) hours as needed for moderate pain or severe pain.  . pantoprazole (PROTONIX) 40 MG tablet Take 1 tablet (40 mg total) by mouth daily.  . TRESIBA FLEXTOUCH  200 UNIT/ML SOPN INJECT 100 UNITS INTO THE SKIN EVERY MORNING.  . TRULICITY 1.5 MG/0.5ML SOPN INJECT 1.5 MG UNDER THE SKIN ONCE A WEEK  . valsartan (DIOVAN) 160 MG tablet Take 160 mg by mouth every morning.   . [DISCONTINUED] NOVOLOG FLEXPEN 100 UNIT/ML FlexPen INJECT 30 UNITS UNDER THE SKIN 3 TIMES A DAY   No facility-administered encounter medications on file as of 04/25/2019.   ALLERGIES: Allergies  Allergen Reactions  . Penicillins Itching and Rash  . Sulfa Antibiotics Itching and Rash   VACCINATION STATUS:  There is no immunization history on file for this patient.  Diabetes She presents for her follow-up diabetic visit. She has type 2 diabetes mellitus. Onset time: She was diagnosed at approximate age of 27 years. Her disease course has been improving. There are no hypoglycemic associated symptoms. Pertinent negatives for hypoglycemia include no confusion, pallor or seizures. There are no diabetic associated symptoms. Pertinent negatives for diabetes include no fatigue and no polyphagia. There are no hypoglycemic complications. Symptoms are improving. There are no diabetic complications. Risk factors for coronary artery disease include dyslipidemia, diabetes mellitus, obesity, hypertension and sedentary lifestyle. Current diabetic treatment includes intensive insulin program. She is compliant with treatment some of the time. She is following a generally unhealthy diet. When asked about meal planning, she reported none. Prior visit with dietitian: She will be scheduled with a dietitian here. She never participates in exercise. Her home blood glucose trend is decreasing steadily. Her breakfast blood glucose range is generally 180-200 mg/dl. Her lunch blood glucose range is generally >200 mg/dl. Her dinner blood glucose range is generally >200 mg/dl. Her bedtime blood glucose range is generally >200 mg/dl. Her overall blood glucose range is >200 mg/dl. (She reports before breakfast readings  between 165 and 171, before lunch readings between 213 and 328, before supper blood glucose readings between 154 and 260, bedtime blood glucose readings between 134 and 226.  Her previsit labs show A1c of 9.7% increasing from 9.3%.) An ACE inhibitor/angiotensin II receptor blocker is being taken. Eye exam is current.  Hyperlipidemia This is a chronic problem. The current episode started more than 1 year ago. The problem is uncontrolled. Exacerbating diseases include diabetes and obesity. Pertinent negatives include no myalgias. Current antihyperlipidemic treatment includes statins. Risk factors for coronary artery disease include diabetes mellitus, dyslipidemia, hypertension, obesity and a sedentary lifestyle.    Review of systems:  Constitutional: + weight loss, no fatigue, no subjective hyperthermia, no subjective hypothermia Eyes: no blurry vision, no xerophthalmia ENT: no sore throat, no nodules palpated in throat, no dysphagia/odynophagia, no hoarseness Cardiovascular:  no Chest Pain, no Shortness of Breath, no palpitations, no leg swelling Respiratory: no cough, no SOB Gastrointestinal: no Nausea/Vomiting/Diarhhea Musculoskeletal: no muscle/joint aches Skin: no rashes Neurological: no tremors, no numbness, no tingling, no dizziness Psychiatric: no depression, no anxiety   Objective:    BP 132/82   Pulse 75   Ht 5\' 9"  (1.753 m)   Wt 282 lb (127.9 kg)   BMI 41.64 kg/m   Wt Readings from Last 3 Encounters:  04/25/19 282 lb (127.9 kg)  04/12/18 297 lb (134.7 kg)  11/21/17 290 lb (131.5 kg)     Physical Exam- Limited  Constitutional:  Body mass index is 41.64 kg/m. , not in acute distress, normal state of mind Eyes:  EOMI, no exophthalmos Neck: Supple Respiratory: Adequate breathing efforts Musculoskeletal: no gross deformities, strength intact in all four extremities, no gross restriction of joint movements Skin:  no rashes, no hyperemia Neurological: no tremor with  outstretched hands.   Complete Blood Count (Most recent): Lab Results  Component Value Date   WBC 11.9 (H) 11/13/2014   HGB 13.4 11/13/2014   HCT 41.3 11/13/2014   MCV 90.2 11/13/2014   PLT 317 11/13/2014   Chemistry (most recent): Lab Results  Component Value Date   NA 134 (L) 11/12/2014   K 4.5 11/12/2014   CL 99 (L) 11/12/2014   CO2 26 11/12/2014   BUN 11 11/22/2018   CREATININE 1.2 (A) 11/22/2018   Diabetic Labs (most recent): Lab Results  Component Value Date   HGBA1C 9.1 (A) 04/25/2019   HGBA1C 8.8 11/22/2018   HGBA1C 9.7 07/10/2018    Lipid Panel     Component Value Date/Time   CHOL 187 03/23/2017 0000   TRIG 162 (A) 03/23/2017 0000   HDL 36 03/23/2017 0000   LDLCALC 119 03/23/2017 0000     Assessment & Plan:   1. Uncontrolled type 2 diabetes mellitus with complication, with long-term current use of insulin (HCC)  Her diabetes is  complicated by obesity status post  Sleeve bariatric surgery which resulted in  Modest 40 lbs of weight loss and patient remains at a high risk for more acute and chronic complications of diabetes which include CAD, CVA, CKD, retinopathy, and neuropathy. These are all discussed in detail with the patient.  -14/09/2016 presents with her CGM device which shows significantly above target glycemic profile both fasting and postprandial.  No hypoglycemia.  Time in range 54% of the time, above range 46% of the time.  Her point-of-care A1c is 9.1%, increasing from 8.8%.     - she  admits there is a room for improvement in her diet and drink choices. -  Suggestion is made for her to avoid simple carbohydrates  from her diet including Cakes, Sweet Desserts / Pastries, Ice Cream, Soda (diet and regular), Sweet Tea, Candies, Chips, Cookies, Sweet Pastries,  Store Bought Juices, Alcohol in Excess of  1-2 drinks a day, Artificial Sweeteners, Coffee Creamer, and "Sugar-free" Products. This will help patient to have stable blood glucose profile and  potentially avoid unintended weight gain.   - I have approached patient with the following individualized plan to manage diabetes and patient agrees.  She is s/p sleeve GBP with modest 40 pounds of weight loss.  She continues to require high-dose insulin to control diabetes.  Her CGM analysis reveals 54% time in range, 46% above range.  Average blood glucose is 179 for the last 14 days.  Her point-of-care A1c is 9.1, increasing from 8.8% during  her last visit. -She consumes relatively large supper last night, advised to shift some of her carbs to lunchtime.   - She he is advised to eat continue Tresiba 100 units daily at bedtime, increase NovoLog to 30 units 3 times a day before breakfast, lunch, supper for pre-meal blood glucose readings above 90 mg/dL, plus patient specific correction for pre-meal blood glucose readings above 150 mg/dL.   -She is encouraged to use her CGM device at all times.  -If she continues to require higher dose of U100 insulin above 200 units a day, she will be considered for insulin U500 on subsequent visits.  - I advised her to document blood glucose at least 4 times a day, before meals and at bedtime.  - She is warned not to take insulin without documenting blood glucose off of the sensor.  -She is advised to continue Glumetza 1000 mg extended release 1 time a day with breakfast.    -She is advised to continue Trulicity 1.5 mg subcutaneously weekly.    2) hypertension: Her blood pressure is controlled to target.  She is advised to continue her current blood pressure medications including valsartan 160 mg p.o. daily in the morning with breakfast.    3) Lipids/HPL: Her last lipid panel showed uncontrolled LDL at 119.   She is advised to continue Vytorin.  She is advised to continue continue Vytorin.  I advised patient to maintain close follow up with her PCP for primary care needs.   - Time spent on this patient care encounter:  35 min, of which > 50% was  spent in  counseling and the rest reviewing her blood glucose logs , discussing her hypoglycemia and hyperglycemia episodes, reviewing her current and  previous labs / studies  ( including abstraction from other facilities) and medications  doses and developing a  long term treatment plan and documenting her care.   Please refer to Patient Instructions for Blood Glucose Monitoring and Insulin/Medications Dosing Guide"  in media tab for additional information. Please  also refer to " Patient Self Inventory" in the Media  tab for reviewed elements of pertinent patient history.  [place] participated in the discussions, expressed understanding, and voiced agreement with the above plans.  All questions were answered to her satisfaction. she is encouraged to contact clinic should she have any questions or concerns prior to her return visit.   Follow up plan: Return in about 4 months (around 08/23/2019) for Follow up with Pre-visit Labs, Bring Meter and Logs- A1c in Office.  Glade Lloyd, MD Phone: [phone]  Fax: [phone]  This note was partially dictated with voice recognition software. Similar sounding words can be transcribed inadequately or may not  be corrected upon review.  04/25/2019, 10:50 AM

## 2019-04-25 NOTE — Patient Instructions (Signed)

## 2019-08-18 ENCOUNTER — Other Ambulatory Visit: Payer: Self-pay | Admitting: "Endocrinology

## 2019-08-27 ENCOUNTER — Ambulatory Visit: Payer: Medicare Other | Admitting: "Endocrinology

## 2019-09-12 ENCOUNTER — Other Ambulatory Visit: Payer: Self-pay

## 2019-09-12 MED ORDER — TRESIBA FLEXTOUCH 200 UNIT/ML ~~LOC~~ SOPN: 100.0000 [IU] | PEN_INJECTOR | Freq: Every day | SUBCUTANEOUS | 0 refills | Status: DC

## 2019-09-19 ENCOUNTER — Other Ambulatory Visit: Payer: Self-pay | Admitting: "Endocrinology

## 2019-10-10 ENCOUNTER — Other Ambulatory Visit: Payer: Self-pay | Admitting: "Endocrinology

## 2019-10-15 LAB — VITAMIN D 25 HYDROXY (VIT D DEFICIENCY, FRACTURES): Vit D, 25-Hydroxy: 22

## 2019-10-15 LAB — LIPID PANEL
Cholesterol: 203 — AB (ref 0–200)
HDL: 55 (ref 35–70)
LDL Cholesterol: 124
Triglycerides: 118 (ref 40–160)

## 2019-10-15 LAB — MICROALBUMIN, URINE: Microalb, Ur: 3.2

## 2019-10-15 LAB — BASIC METABOLIC PANEL
BUN: 13 (ref 4–21)
CO2: 32 — AB (ref 13–22)
Chloride: 104 (ref 99–108)
Creatinine: 1 (ref 0.5–1.1)
Potassium: 4.3 (ref 3.4–5.3)
Sodium: 139 (ref 137–147)

## 2019-10-15 LAB — COMPREHENSIVE METABOLIC PANEL
Calcium: 8.9 (ref 8.7–10.7)
GFR calc Af Amer: >60
GFR calc non Af Amer: >60

## 2019-10-15 LAB — TSH: TSH: 1.27 (ref 0.41–5.90)

## 2019-10-17 ENCOUNTER — Other Ambulatory Visit: Payer: Self-pay

## 2019-10-17 MED ORDER — NOVOLOG FLEXPEN 100 UNIT/ML ~~LOC~~ SOPN: PEN_INJECTOR | SUBCUTANEOUS | 0 refills | Status: DC

## 2019-11-20 ENCOUNTER — Encounter: Payer: Self-pay | Admitting: "Endocrinology

## 2019-11-20 ENCOUNTER — Other Ambulatory Visit: Payer: Self-pay

## 2019-11-20 ENCOUNTER — Ambulatory Visit: Payer: Medicare Other | Admitting: "Endocrinology

## 2019-11-20 VITALS — BP 150/84 | HR 84 | Ht 69.0 in | Wt 290.8 lb

## 2019-11-20 DIAGNOSIS — E1165 Type 2 diabetes mellitus with hyperglycemia: Secondary | ICD-10-CM | POA: Diagnosis not present

## 2019-11-20 DIAGNOSIS — I10 Essential (primary) hypertension: Secondary | ICD-10-CM

## 2019-11-20 DIAGNOSIS — E782 Mixed hyperlipidemia: Secondary | ICD-10-CM

## 2019-11-20 DIAGNOSIS — E118 Type 2 diabetes mellitus with unspecified complications: Secondary | ICD-10-CM

## 2019-11-20 DIAGNOSIS — IMO0002 Reserved for concepts with insufficient information to code with codable children: Secondary | ICD-10-CM

## 2019-11-20 DIAGNOSIS — Z794 Long term (current) use of insulin: Secondary | ICD-10-CM | POA: Diagnosis not present

## 2019-11-20 LAB — POCT GLYCOSYLATED HEMOGLOBIN (HGB A1C): Hemoglobin A1C: 9.7 % — AB (ref 4.0–5.6)

## 2019-11-20 MED ORDER — HUMULIN R U-500 KWIKPEN 500 UNIT/ML ~~LOC~~ SOPN
50.0000 [IU] | PEN_INJECTOR | Freq: Three times a day (TID) | SUBCUTANEOUS | 2 refills | Status: DC
Start: 2019-11-20 — End: 2019-11-27

## 2019-11-20 MED ORDER — EZETIMIBE-SIMVASTATIN 10-40 MG PO TABS: 1.0000 | ORAL_TABLET | Freq: Every day | ORAL | 2 refills | Status: DC

## 2019-11-20 NOTE — Patient Instructions (Signed)

## 2019-11-20 NOTE — Progress Notes (Signed)
11/20/2019   Endocrinology follow-up note  Subjective:    Patient ID: Emily Leon, female    DOB: July 18, 1950,    Past Medical History:  Diagnosis Date  . Arthritis   . Bronchitis    hx of   . Cataracts, bilateral   . Chronic kidney disease   . Cough   . Diabetes mellitus without complication (HCC)   . Hypertension   . Shortness of breath dyspnea    walking distances or climbing stairs  . Sleep apnea    uses CPAP  . Tingling    toes bilat   . Urinary frequency    Past Surgical History:  Procedure Laterality Date  . ABDOMINAL HYSTERECTOMY    . BREATH TEK H PYLORI N/A 08/26/2014   Procedure: BREATH TEK H PYLORI;  Surgeon: Gaynelle Adu, MD;  Location: [facility];  Service: General;  Laterality: N/A;  . KNEE SURGERY Left 2009  . LAPAROSCOPIC GASTRIC SLEEVE RESECTION N/A 11/11/2014   Procedure: LAPAROSCOPIC GASTRIC SLEEVE RESECTION;  Surgeon: Gaynelle Adu, MD;  Location: [facility];  Service: General;  Laterality: N/A;  . UPPER GI ENDOSCOPY  11/11/2014   Procedure: UPPER GI ENDOSCOPY;  Surgeon: Gaynelle Adu, MD;  Location: [facility];  Service: General;;   Social History   Socioeconomic History  . Marital status: Married    Spouse name: Not on file  . Number of children: Not on file  . Years of education: Not on file  . Highest education level: Not on file  Occupational History  . Not on file  Tobacco Use  . Smoking status: Never Smoker  . Smokeless tobacco: Never Used  Vaping Use  . Vaping Use: Never used  Substance and Sexual Activity  . Alcohol use: No  . Drug use: No  . Sexual activity: Not on file  Other Topics Concern  . Not on file  Social History Narrative  . Not on file   Social Determinants of Health   Financial Resource Strain:   . Difficulty of Paying Living Expenses:   Food Insecurity:   . Worried About Programme researcher, broadcasting/film/video in the Last Year:   . Barista in the Last Year:   Transportation Needs:   . Freight forwarder (Medical):   Marland Kitchen  Lack of Transportation (Non-Medical):   Physical Activity:   . Days of Exercise per Week:   . Minutes of Exercise per Session:   Stress:   . Feeling of Stress :   Social Connections:   . Frequency of Communication with Friends and Family:   . Frequency of Social Gatherings with Friends and Family:   . Attends Religious Services:   . Active Member of Clubs or Organizations:   . Attends Banker Meetings:   Marland Kitchen Marital Status:    Outpatient Encounter Medications as of 11/20/2019  Medication Sig  . amLODipine (NORVASC) 10 MG tablet Take 10 mg by mouth every morning.  Marland Kitchen atenolol (TENORMIN) 100 MG tablet Take 100 mg by mouth every morning.   . B-D UF III MINI PEN NEEDLES 31G X 5 MM MISC USE 4 TIMES DAILY AS DIRECTED  . Continuous Blood Gluc Sensor (FREESTYLE LIBRE 14 DAY SENSOR) MISC 1 each by Does not apply route every 14 (fourteen) days.  . ergocalciferol (VITAMIN D2) 50000 UNITS capsule Take 50,000 Units by mouth every Monday.  . ezetimibe-simvastatin (VYTORIN) 10-40 MG tablet Take 1 tablet by mouth at bedtime.  . hydrochlorothiazide (HYDRODIURIL) 25 MG tablet  Take 12.5 mg by mouth every morning.   . insulin regular human CONCENTRATED (HUMULIN R U-500 KWIKPEN) 500 UNIT/ML kwikpen Inject 50 Units into the skin 3 (three) times daily with meals.  Marland Kitchen loratadine (CLARITIN) 10 MG tablet Take 10 mg by mouth daily.  . metFORMIN (GLUCOPHAGE-XR) 500 MG 24 hr tablet TAKE 2 TABLETS BY MOUTH EVERY DAY WITH BREAKFAST  . ondansetron (ZOFRAN ODT) 4 MG disintegrating tablet Take 1 tablet (4 mg total) by mouth every 8 (eight) hours as needed for nausea or vomiting.  Marland Kitchen oxyCODONE (ROXICODONE) 5 MG/5ML solution Take 5-10 mLs (5-10 mg total) by mouth every 4 (four) hours as needed for moderate pain or severe pain.  . pantoprazole (PROTONIX) 40 MG tablet Take 1 tablet (40 mg total) by mouth daily.  . TRULICITY 1.5 MG/0.5ML SOPN INJECT 1.5 MG UNDER THE SKIN ONCE A WEEK  . valsartan (DIOVAN) 160 MG tablet  Take 160 mg by mouth every morning.   . [DISCONTINUED] ezetimibe-simvastatin (VYTORIN) 10-40 MG tablet TAKE 1 TABLET BY MOUTH AT BEDTIME  . [DISCONTINUED] insulin aspart (NOVOLOG FLEXPEN) 100 UNIT/ML FlexPen INJECT 30 UNITS UNDER THE SKIN 3 TIMES A DAY  . [DISCONTINUED] insulin degludec (TRESIBA FLEXTOUCH) 200 UNIT/ML FlexTouch Pen Inject 100 Units into the skin at bedtime.   No facility-administered encounter medications on file as of 11/20/2019.   ALLERGIES: Allergies  Allergen Reactions  . Penicillins Itching and Rash  . Sulfa Antibiotics Itching and Rash   VACCINATION STATUS:  There is no immunization history on file for this patient.  Diabetes She presents for her follow-up diabetic visit. She has type 2 diabetes mellitus. Onset time: She was diagnosed at approximate age of 27 years. Her disease course has been worsening. There are no hypoglycemic associated symptoms. Pertinent negatives for hypoglycemia include no confusion, pallor or seizures. There are no diabetic associated symptoms. Pertinent negatives for diabetes include no fatigue and no polyphagia. There are no hypoglycemic complications. Symptoms are worsening. There are no diabetic complications. Risk factors for coronary artery disease include dyslipidemia, diabetes mellitus, obesity, hypertension and sedentary lifestyle. Current diabetic treatment includes intensive insulin program. She is compliant with treatment some of the time. She is following a generally unhealthy diet. When asked about meal planning, she reported none. Prior visit with dietitian: She will be scheduled with a dietitian here. She never participates in exercise. Her home blood glucose trend is increasing steadily. Her breakfast blood glucose range is generally >200 mg/dl. Her lunch blood glucose range is generally >200 mg/dl. Her dinner blood glucose range is generally >200 mg/dl. Her bedtime blood glucose range is generally >200 mg/dl. Her overall blood glucose  range is >200 mg/dl. (She presents with her CGM device indicating average blood glucose of  217, point of care A1c 9.7%.  No hypoglycemia.   Her CGM analysis shows 29% time in range, 70% above range.  ) An ACE inhibitor/angiotensin II receptor blocker is being taken. Eye exam is current.  Hyperlipidemia This is a chronic problem. The current episode started more than 1 year ago. The problem is uncontrolled. Exacerbating diseases include diabetes and obesity. Pertinent negatives include no myalgias. Current antihyperlipidemic treatment includes statins. Risk factors for coronary artery disease include diabetes mellitus, dyslipidemia, hypertension, obesity and a sedentary lifestyle.    Review of systems:  Constitutional: + weight gain, no fatigue, no subjective hyperthermia, no subjective hypothermia Eyes: no blurry vision, no xerophthalmia ENT: no sore throat, no nodules palpated in throat, no dysphagia/odynophagia, no hoarseness Cardiovascular: no Chest  Pain, no Shortness of Breath, no palpitations, no leg swelling Respiratory: no cough, no SOB Gastrointestinal: no Nausea/Vomiting/Diarhhea Musculoskeletal: no muscle/joint aches Skin: no rashes Neurological: no tremors, no numbness, no tingling, no dizziness Psychiatric: no depression, no anxiety   Objective:    BP (!) 150/84   Pulse 84   Ht 5\' 9"  (1.753 m)   Wt 290 lb 12.8 oz (131.9 kg)   BMI 42.94 kg/m   Wt Readings from Last 3 Encounters:  11/20/19 290 lb 12.8 oz (131.9 kg)  04/25/19 282 lb (127.9 kg)  04/12/18 297 lb (134.7 kg)     Physical Exam- Limited  Constitutional:  Body mass index is 42.94 kg/m. , not in acute distress, normal state of mind Eyes:  EOMI, no exophthalmos Neck: Supple Respiratory: Adequate breathing efforts Musculoskeletal: no gross deformities, strength intact in all four extremities, no gross restriction of joint movements Skin:  no rashes, no hyperemia Neurological: no tremor with outstretched  hands.   Complete Blood Count (Most recent): Lab Results  Component Value Date   WBC 11.9 (H) 11/13/2014   HGB 13.4 11/13/2014   HCT 41.3 11/13/2014   MCV 90.2 11/13/2014   PLT 317 11/13/2014   Chemistry (most recent): Lab Results  Component Value Date   NA 139 10/15/2019   K 4.3 10/15/2019   CL 104 10/15/2019   CO2 32 (A) 10/15/2019   BUN 13 10/15/2019   CREATININE 1.0 10/15/2019   Diabetic Labs (most recent): Lab Results  Component Value Date   HGBA1C 9.7 (A) 11/20/2019   HGBA1C 9.1 (A) 04/25/2019   HGBA1C 8.8 11/22/2018    Lipid Panel     Component Value Date/Time   CHOL 203 (A) 10/15/2019 0000   TRIG 118 10/15/2019 0000   HDL 55 10/15/2019 0000   LDLCALC 124 10/15/2019 0000     Assessment & Plan:   1. Uncontrolled type 2 diabetes mellitus with complication, with long-term current use of insulin (HCC)  Her diabetes is  complicated by obesity status post  Sleeve bariatric surgery which resulted in  Modest 40 lbs of weight loss and patient remains at a high risk for more acute and chronic complications of diabetes which include CAD, CVA, CKD, retinopathy, and neuropathy. These are all discussed in detail with the patient.  -She presents with her CGM device indicating average blood glucose of 217-250, point of care A1c 9.7%.  No hypoglycemia.  - Her CGM analysis shows 29% time in range, 70% above range.   - she  admits there is a room for improvement in her diet and drink choices. -  Suggestion is made for her to avoid simple carbohydrates  from her diet including Cakes, Sweet Desserts / Pastries, Ice Cream, Soda (diet and regular), Sweet Tea, Candies, Chips, Cookies, Sweet Pastries,  Store Bought Juices, Alcohol in Excess of  1-2 drinks a day, Artificial Sweeteners, Coffee Creamer, and "Sugar-free" Products. This will help patient to have stable blood glucose profile and potentially avoid unintended weight gain.  - I have approached patient with the following  individualized plan to manage diabetes and patient agrees.  -She has reversed all the gains of her sleeve gastrectomy.  She continues to gain weight.   Her CGM analysis reveals 29% time in range, 70% above range.  Average blood glucose is 217-250.  -She consumes relatively large supper last night, advised to shift some of her carbs to lunchtime.   -After she utilized her current supplies of [place], she will be considered  for a new 500.    She  he is advised to eat continue Tresiba 100 units daily at bedtime, increase NovoLog to 30 units 3 times a day before breakfast, lunch, supper for pre-meal blood glucose readings above 90 mg/dL, plus patient specific correction for pre-meal blood glucose readings above 150 mg/dL.   -She is encouraged to use her CGM device at all times.  -After that, she will start weaning new 500 50 units 3 times daily AC for Premeal blood glucose readings above 90 mg per DL. -Insulin injection techniques are revisited with her. - I advised her to document blood glucose at least 4 times a day, before meals and at bedtime.  - She is warned not to take insulin without documenting blood glucose off of the sensor.  -She is advised to continue Glumetza 1000 mg extended release 1 time a day with breakfast.    -She is advised to continue Trulicity 1.5 mg subcutaneously weekly.    2) hypertension: -Her blood pressure is not controlled to target.  She is advised to continue her current blood pressure medications including valsartan 160 mg p.o. daily in the morning with breakfast.    3) Lipids/HPL: Her last lipid panel showed uncontrolled LDL at 119.   She is advised to continue Vytorin.  She is advised to continue continue Vytorin.  4) weight management: Her current BMI is 42.94.  This clearly complicates her diabetes care.  She is status post sleeve gastrectomy in the past with successful loss of 40+ pound weight.  However, more recently, she is gaining weight. See  above.  I advised patient to maintain close follow up with her PCP for primary care needs.   - Time spent on this patient care encounter:  35 min, of which > 50% was spent in  counseling and the rest reviewing her blood glucose logs , discussing her hypoglycemia and hyperglycemia episodes, reviewing her current and  previous labs / studies  ( including abstraction from other facilities) and medications  doses and developing a  long term treatment plan and documenting her care.   Please refer to Patient Instructions for Blood Glucose Monitoring and Insulin/Medications Dosing Guide"  in media tab for additional information. Please  also refer to " Patient Self Inventory" in the Media  tab for reviewed elements of pertinent patient history.  Edison Simon Kellman participated in the discussions, expressed understanding, and voiced agreement with the above plans.  All questions were answered to her satisfaction. she is encouraged to contact clinic should she have any questions or concerns prior to her return visit.    Follow up plan: Return in about 4 weeks (around 12/18/2019) for F/U with Meter and Logs Only - no Labs.  Marquis Lunch, MD Phone: [phone]  Fax: [phone]  This note was partially dictated with voice recognition software. Similar sounding words can be transcribed inadequately or may not  be corrected upon review.  11/20/2019, 6:01 PM

## 2019-11-27 ENCOUNTER — Telehealth: Payer: Self-pay | Admitting: "Endocrinology

## 2019-11-27 ENCOUNTER — Other Ambulatory Visit: Payer: Self-pay

## 2019-11-27 DIAGNOSIS — IMO0002 Reserved for concepts with insufficient information to code with codable children: Secondary | ICD-10-CM

## 2019-11-27 MED ORDER — HUMULIN R U-500 KWIKPEN 500 UNIT/ML ~~LOC~~ SOPN: 50.0000 [IU] | PEN_INJECTOR | Freq: Three times a day (TID) | SUBCUTANEOUS | 1 refills | Status: DC

## 2019-11-27 NOTE — Telephone Encounter (Signed)
Pt had OV on 8/4 and states that Dr Fransico Him was changing her insulin. She said she wnt to the pharmacy and they did not have anything

## 2019-11-27 NOTE — Telephone Encounter (Signed)
Resent to pharmacy, pt was advised.

## 2019-12-05 ENCOUNTER — Telehealth: Payer: Self-pay | Admitting: "Endocrinology

## 2019-12-05 NOTE — Telephone Encounter (Signed)
Left a message requesting a return call to the office. 

## 2019-12-05 NOTE — Telephone Encounter (Signed)
Pt is requesting a call back regarding her sugar dropping. Please advise

## 2019-12-06 NOTE — Telephone Encounter (Signed)
Pt returning your call

## 2019-12-06 NOTE — Telephone Encounter (Signed)
Left a message requesting a return call to the office. 

## 2019-12-09 NOTE — Telephone Encounter (Signed)
Pt called and states she has not been at her home phone. She can be reached at [phone]

## 2019-12-09 NOTE — Telephone Encounter (Signed)
Pt states she's taking Humulin R U-500 50 units at breakfast, lunch and dinner and in the evening her BG is dropping. Stated she was not at home and was unable to give me any of her BG readings. Advised pt to call me back with the past 3 days of glucose readings once she got home. Understanding voiced per pt.

## 2019-12-10 NOTE — Telephone Encounter (Signed)
Left a message requesting a return call to the office. 

## 2019-12-17 ENCOUNTER — Other Ambulatory Visit: Payer: Self-pay | Admitting: "Endocrinology

## 2019-12-18 ENCOUNTER — Ambulatory Visit: Payer: Medicare Other | Admitting: "Endocrinology

## 2019-12-19 ENCOUNTER — Other Ambulatory Visit: Payer: Self-pay

## 2019-12-19 ENCOUNTER — Encounter: Payer: Self-pay | Admitting: "Endocrinology

## 2019-12-19 ENCOUNTER — Ambulatory Visit (INDEPENDENT_AMBULATORY_CARE_PROVIDER_SITE_OTHER): Payer: Medicare Other | Admitting: "Endocrinology

## 2019-12-19 DIAGNOSIS — E118 Type 2 diabetes mellitus with unspecified complications: Secondary | ICD-10-CM | POA: Diagnosis not present

## 2019-12-19 DIAGNOSIS — Z794 Long term (current) use of insulin: Secondary | ICD-10-CM | POA: Diagnosis not present

## 2019-12-19 DIAGNOSIS — E1165 Type 2 diabetes mellitus with hyperglycemia: Secondary | ICD-10-CM

## 2019-12-19 DIAGNOSIS — IMO0002 Reserved for concepts with insufficient information to code with codable children: Secondary | ICD-10-CM

## 2019-12-19 MED ORDER — HUMULIN R U-500 KWIKPEN 500 UNIT/ML ~~LOC~~ SOPN: 40.0000 [IU] | PEN_INJECTOR | Freq: Three times a day (TID) | SUBCUTANEOUS | 1 refills | Status: DC

## 2019-12-19 NOTE — Patient Instructions (Signed)

## 2019-12-19 NOTE — Progress Notes (Signed)
12/19/2019   Endocrinology follow-up note  Subjective:    Patient ID: Emily Leon, female    DOB: 1951-04-15,    Past Medical History:  Diagnosis Date   Arthritis    Bronchitis    hx of    Cataracts, bilateral    Chronic kidney disease    Cough    Diabetes mellitus without complication (HCC)    Hypertension    Shortness of breath dyspnea    walking distances or climbing stairs   Sleep apnea    uses CPAP   Tingling    toes bilat    Urinary frequency    Past Surgical History:  Procedure Laterality Date   ABDOMINAL HYSTERECTOMY     BREATH TEK H PYLORI N/A 08/26/2014   Procedure: BREATH TEK H PYLORI;  Surgeon: Gaynelle Adu, MD;  Location: [facility];  Service: General;  Laterality: N/A;   KNEE SURGERY Left 2009   LAPAROSCOPIC GASTRIC SLEEVE RESECTION N/A 11/11/2014   Procedure: LAPAROSCOPIC GASTRIC SLEEVE RESECTION;  Surgeon: Gaynelle Adu, MD;  Location: [facility];  Service: General;  Laterality: N/A;   UPPER GI ENDOSCOPY  11/11/2014   Procedure: UPPER GI ENDOSCOPY;  Surgeon: Gaynelle Adu, MD;  Location: [facility];  Service: General;;   Social History   Socioeconomic History   Marital status: Married    Spouse name: Not on file   Number of children: Not on file   Years of education: Not on file   Highest education level: Not on file  Occupational History   Not on file  Tobacco Use   Smoking status: Never Smoker   Smokeless tobacco: Never Used  Vaping Use   Vaping Use: Never used  Substance and Sexual Activity   Alcohol use: No   Drug use: No   Sexual activity: Not on file  Other Topics Concern   Not on file  Social History Narrative   Not on file   Social Determinants of Health   Financial Resource Strain:    Difficulty of Paying Living Expenses: Not on file  Food Insecurity:    Worried About Running Out of Food in the Last Year: Not on file   Ran Out of Food in the Last Year: Not on file  Transportation Needs:    Lack  of Transportation (Medical): Not on file   Lack of Transportation (Non-Medical): Not on file  Physical Activity:    Days of Exercise per Week: Not on file   Minutes of Exercise per Session: Not on file  Stress:    Feeling of Stress : Not on file  Social Connections:    Frequency of Communication with Friends and Family: Not on file   Frequency of Social Gatherings with Friends and Family: Not on file   Attends Religious Services: Not on file   Active Member of Clubs or Organizations: Not on file   Attends Club or Organization Meetings: Not on file   Marital Status: Not on file   Outpatient Encounter Medications as of 12/19/2019  Medication Sig   amLODipine (NORVASC) 10 MG tablet Take 10 mg by mouth every morning.   atenolol (TENORMIN) 100 MG tablet Take 100 mg by mouth every morning.    B-D UF III MINI PEN NEEDLES 31G X 5 MM MISC USE 4 TIMES DAILY AS DIRECTED   Continuous Blood Gluc Sensor (FREESTYLE LIBRE 14 DAY SENSOR) MISC 1 each by Does not apply route every 14 (fourteen) days.   ergocalciferol (VITAMIN D2) 50000 UNITS capsule  Take 50,000 Units by mouth every Monday.   ezetimibe-simvastatin (VYTORIN) 10-40 MG tablet Take 1 tablet by mouth at bedtime.   hydrochlorothiazide (HYDRODIURIL) 25 MG tablet Take 12.5 mg by mouth every morning.    insulin regular human CONCENTRATED (HUMULIN R U-500 KWIKPEN) 500 UNIT/ML kwikpen Inject 40-60 Units into the skin 3 (three) times daily with meals.   loratadine (CLARITIN) 10 MG tablet Take 10 mg by mouth daily.   metFORMIN (GLUCOPHAGE-XR) 500 MG 24 hr tablet TAKE 2 TABLETS BY MOUTH EVERY DAY WITH BREAKFAST   ondansetron (ZOFRAN ODT) 4 MG disintegrating tablet Take 1 tablet (4 mg total) by mouth every 8 (eight) hours as needed for nausea or vomiting.   oxyCODONE (ROXICODONE) 5 MG/5ML solution Take 5-10 mLs (5-10 mg total) by mouth every 4 (four) hours as needed for moderate pain or severe pain.   pantoprazole (PROTONIX) 40 MG  tablet Take 1 tablet (40 mg total) by mouth daily.   TRULICITY 1.5 MG/0.5ML SOPN INJECT 1.5 MG UNDER THE SKIN ONCE A WEEK   valsartan (DIOVAN) 160 MG tablet Take 160 mg by mouth every morning.    [DISCONTINUED] insulin regular human CONCENTRATED (HUMULIN R U-500 KWIKPEN) 500 UNIT/ML kwikpen Inject 50 Units into the skin 3 (three) times daily with meals.   No facility-administered encounter medications on file as of 12/19/2019.   ALLERGIES: Allergies  Allergen Reactions   Penicillins Itching and Rash   Sulfa Antibiotics Itching and Rash   VACCINATION STATUS:  There is no immunization history on file for this patient.  Diabetes She presents for her follow-up diabetic visit. She has type 2 diabetes mellitus. Onset time: She was diagnosed at approximate age of 27 years. Her disease course has been improving. There are no hypoglycemic associated symptoms. Pertinent negatives for hypoglycemia include no confusion, pallor or seizures. There are no diabetic associated symptoms. Pertinent negatives for diabetes include no fatigue and no polyphagia. There are no hypoglycemic complications. Symptoms are improving. There are no diabetic complications. Risk factors for coronary artery disease include dyslipidemia, diabetes mellitus, obesity, hypertension and sedentary lifestyle. Current diabetic treatment includes intensive insulin program. She is compliant with treatment some of the time. She is following a generally unhealthy diet. When asked about meal planning, she reported none. Prior visit with dietitian: She will be scheduled with a dietitian here. She never participates in exercise. Her home blood glucose trend is increasing steadily. Her breakfast blood glucose range is generally 180-200 mg/dl. Her lunch blood glucose range is generally >200 mg/dl. Her dinner blood glucose range is generally >200 mg/dl. Her bedtime blood glucose range is generally 140-180 mg/dl. Her overall blood glucose range is  180-200 mg/dl. (She presents with evidence of improvement using her new U 500 insulin.  On repeat analysis of her CGM device, she has 28% time range mostly overnight, 72% above range.  No hypoglycemia.  She worries about tightening readings overnight.  This is despite her recent A1c was 9.7%, and her average blood glucose of 236 over the last 14 days. ) An ACE inhibitor/angiotensin II receptor blocker is being taken. Eye exam is current.  Hyperlipidemia This is a chronic problem. The current episode started more than 1 year ago. The problem is uncontrolled. Exacerbating diseases include diabetes and obesity. Pertinent negatives include no myalgias. Current antihyperlipidemic treatment includes statins. Risk factors for coronary artery disease include diabetes mellitus, dyslipidemia, hypertension, obesity and a sedentary lifestyle.    Review of systems:  Constitutional: + weight gain, no fatigue, no subjective  hyperthermia, no subjective hypothermia Eyes: no blurry vision, no xerophthalmia ENT: no sore throat, no nodules palpated in throat, no dysphagia/odynophagia, no hoarseness Cardiovascular: no Chest Pain, no Shortness of Breath, no palpitations, no leg swelling Respiratory: no cough, no SOB Gastrointestinal: no Nausea/Vomiting/Diarhhea Musculoskeletal: no muscle/joint aches Skin: no rashes Neurological: no tremors, no numbness, no tingling, no dizziness Psychiatric: no depression, no anxiety   Objective:    BP (!) 168/82    Pulse 100    Ht 5\' 9"  (1.753 m)    Wt 286 lb (129.7 kg)    BMI 42.23 kg/m   Wt Readings from Last 3 Encounters:  12/19/19 286 lb (129.7 kg)  11/20/19 290 lb 12.8 oz (131.9 kg)  04/25/19 282 lb (127.9 kg)     Physical Exam- Limited  Constitutional:  Body mass index is 42.23 kg/m. , not in acute distress, normal state of mind Eyes:  EOMI, no exophthalmos Neck: Supple Respiratory: Adequate breathing efforts Musculoskeletal: no gross deformities, strength  intact in all four extremities, no gross restriction of joint movements Skin:  no rashes, no hyperemia Neurological: no tremor with outstretched hands.   Complete Blood Count (Most recent): Lab Results  Component Value Date   WBC 11.9 (H) 11/13/2014   HGB 13.4 11/13/2014   HCT 41.3 11/13/2014   MCV 90.2 11/13/2014   PLT 317 11/13/2014   Chemistry (most recent): Lab Results  Component Value Date   NA 139 10/15/2019   K 4.3 10/15/2019   CL 104 10/15/2019   CO2 32 (A) 10/15/2019   BUN 13 10/15/2019   CREATININE 1.0 10/15/2019   Diabetic Labs (most recent): Lab Results  Component Value Date   HGBA1C 9.7 (A) 11/20/2019   HGBA1C 9.1 (A) 04/25/2019   HGBA1C 8.8 11/22/2018    Lipid Panel     Component Value Date/Time   CHOL 203 (A) 10/15/2019 0000   TRIG 118 10/15/2019 0000   HDL 55 10/15/2019 0000   LDLCALC 124 10/15/2019 0000     Assessment & Plan:   1. Uncontrolled type 2 diabetes mellitus with complication, with long-term current use of insulin (HCC)  Her diabetes is  complicated by obesity status post  Sleeve bariatric surgery which resulted in  Modest 40 lbs of weight loss and patient remains at a high risk for more acute and chronic complications of diabetes which include CAD, CVA, CKD, retinopathy, and neuropathy. These are all discussed in detail with the patient.  She presents with evidence of improvement using her new U 500 insulin.  On repeat analysis of her CGM device, she has 28% time range mostly overnight, 72% above range.  No hypoglycemia.  She worries about tightening readings overnight.  This is despite her recent A1c was 9.7%, and her average blood glucose of 236 over the last 14 days.  - she  admits there is a room for improvement in her diet and drink choices. -  Suggestion is made for her to avoid simple carbohydrates  from her diet including Cakes, Sweet Desserts / Pastries, Ice Cream, Soda (diet and regular), Sweet Tea, Candies, Chips, Cookies,  Sweet Pastries,  Store Bought Juices, Alcohol in Excess of  1-2 drinks a day, Artificial Sweeteners, Coffee Creamer, and "Sugar-free" Products. This will help patient to have stable blood glucose profile and potentially avoid unintended weight gain.   - I have approached patient with the following individualized plan to manage diabetes and patient agrees.  -She has reversed all the gains of her sleeve gastrectomy.  She continues to gain weight.   -She consumes relatively large supper last night, advised to shift some of her carbs to lunchtime.  Evidently, she is benefiting from the new 500 insulin.  She is assured that tightening of blood glucose is expected from better action of her new insulin. -I discussed and lowered her dose to 40 units at supper, increase her breakfast and lunch dose to 60 units for premeal blood glucose readings above 90 mg per DL. - I advised her to document blood glucose at least 4 times a day, before meals and at bedtime.  - She is warned not to take insulin without documenting blood glucose off of the sensor.  -She is advised to continue Glumetza 1000 mg extended release 1 time a day with breakfast.    -She is advised to continue Trulicity 1.5 mg subcutaneously weekly.    2) hypertension: -Her blood pressure is not controlled to target.  She is advised to continue her current blood pressure medications including valsartan 160 mg p.o. daily in the morning with breakfast.    3) Lipids/HPL: Her last lipid panel showed uncontrolled LDL at 119.   She is advised to continue Vytorin.  She is advised to continue continue Vytorin.  She complains of leg cramps.  She will be referred to vascular surgery for evaluation management  4) weight management: Her current BMI is 42.94.  This clearly complicates her diabetes care.  She is status post sleeve gastrectomy in the past with successful loss of 40+ pound weight.  However, more recently, she is gaining weight. See  above.  I advised patient to maintain close follow up with her PCP for primary care needs.   - Time spent on this patient care encounter:  35 min, of which > 50% was spent in  counseling and the rest reviewing her blood glucose logs , discussing her hypoglycemia and hyperglycemia episodes, reviewing her current and  previous labs / studies  ( including abstraction from other facilities) and medications  doses and developing a  long term treatment plan and documenting her care.   Please refer to Patient Instructions for Blood Glucose Monitoring and Insulin/Medications Dosing Guide"  in media tab for additional information. Please  also refer to " Patient Self Inventory" in the Media  tab for reviewed elements of pertinent patient history.  Emily Leon participated in the discussions, expressed understanding, and voiced agreement with the above plans.  All questions were answered to her satisfaction. she is encouraged to contact clinic should she have any questions or concerns prior to her return visit.    Follow up plan: Return in about 9 weeks (around 02/20/2020) for Bring Meter and Logs- A1c in Office.  Marquis Lunch, MD Phone: [phone]  Fax: [phone]  This note was partially dictated with voice recognition software. Similar sounding words can be transcribed inadequately or may not  be corrected upon review.  12/19/2019, 4:33 PM

## 2020-01-06 ENCOUNTER — Telehealth: Payer: Self-pay

## 2020-01-06 NOTE — Telephone Encounter (Signed)
I returned call to patient, she states that Sat readings were 178 am, 399 lunch, 396 at dinner and around 9-10pm she checked it and it was 40-50, stated that she took 60 units of insulin twice that day and 40 units at bedtime, did drink some OJ to get her sugar back up. On Sunday readings 310 am, 267 lunch, 249 dinner, 175 at hs, this morning 248. Stated that she did not take the 40 units at bedtime, she is not satisfied with how her blood glucose readings are. Please advise.

## 2020-01-06 NOTE — Telephone Encounter (Signed)
Returned call to patient, had to leave a voicemail for pt to return call to advise of changes,

## 2020-01-06 NOTE — Telephone Encounter (Signed)
Patient left a VM that she is having high readings , she is asking for a return call

## 2020-01-06 NOTE — Telephone Encounter (Signed)
-   the most important aspect of her care is consistency. She needs to strictly follow the meal/insulin timing suggested .  We do not want her to do it randomly . Her meals and insulin doses have to separated by at least 4 hoursfrom each other. She may increase her U500  insulin to 50 unitsat supper,  keep it same at  60 units for breakfast and lunch for blood glucose readings above 90 mg per DL.

## 2020-01-07 NOTE — Telephone Encounter (Signed)
Called pt, no answer, left voicemail for pt to return call to advise.

## 2020-01-07 NOTE — Telephone Encounter (Signed)
Attempted to contact patient, no answer, had to leave a voicemail for return call

## 2020-01-07 NOTE — Telephone Encounter (Signed)
Patient returned call and I advised her of your instructions. She states that she has been consistent with following the recommendations, what is happening is after she goes to bed and sleeps for a couple of hours she will wake up not feeling well, sweating, and her blood glucose level will be 40-50.Please advise

## 2020-01-07 NOTE — Telephone Encounter (Signed)
Ok, another recommendation: Lower U500 to 50 units with breakfast and lunch and lower to 30 units with supper.

## 2020-01-08 NOTE — Telephone Encounter (Signed)
Patient returned call and advised of the changes, verbalized understanding.

## 2020-01-14 ENCOUNTER — Other Ambulatory Visit: Payer: Self-pay

## 2020-01-14 DIAGNOSIS — IMO0002 Reserved for concepts with insufficient information to code with codable children: Secondary | ICD-10-CM

## 2020-01-20 NOTE — Telephone Encounter (Signed)
Spoke with patient, she is stating that on Friday her fasting BG was 247 AM, 418 after she ate Breakfast, 40 in the middle of the night, 178 AM fasting on Sunday, 399 after breakfast, 475 after lunch, this morning 149 fasting and now after breakfast 347, she is concerned about the high readings during the day and waking up in the middle of the night with BG 40-54. Please advise.

## 2020-01-20 NOTE — Telephone Encounter (Signed)
I returned call to patient and advised of changes and recommendations, pt is still concerned about the numbers being elevated 2 hours after she eats, she doesn't seem happy with the insulin that she is on, stated that she never had this when she was on the novolog. I did advise her that we need the pre meal blood glucose levels and to only test after meals if she felt it was dropping.

## 2020-01-20 NOTE — Telephone Encounter (Signed)
Patient is still having high sugar readings. Please advise.

## 2020-01-20 NOTE — Telephone Encounter (Signed)
-  Advise not to test after meals unless she feels symptoms of low. Blood glucose readings will be high after meals and we do not use those to make changes. - continue  U500 50 units with breakfast and lunch and lower to 20 units with supper.

## 2020-01-21 NOTE — Telephone Encounter (Signed)
Let her stay on U500 and will switch her to [place] and Novolog during her next visit if necessary.

## 2020-02-06 ENCOUNTER — Other Ambulatory Visit: Payer: Self-pay | Admitting: "Endocrinology

## 2020-02-19 ENCOUNTER — Ambulatory Visit: Payer: Medicare Other | Admitting: "Endocrinology

## 2020-02-19 ENCOUNTER — Encounter: Payer: Self-pay | Admitting: "Endocrinology

## 2020-02-19 ENCOUNTER — Other Ambulatory Visit: Payer: Self-pay

## 2020-02-19 VITALS — BP 146/84 | HR 76 | Ht 69.0 in | Wt 277.4 lb

## 2020-02-19 DIAGNOSIS — I10 Essential (primary) hypertension: Secondary | ICD-10-CM | POA: Diagnosis not present

## 2020-02-19 DIAGNOSIS — Z794 Long term (current) use of insulin: Secondary | ICD-10-CM | POA: Diagnosis not present

## 2020-02-19 DIAGNOSIS — IMO0002 Reserved for concepts with insufficient information to code with codable children: Secondary | ICD-10-CM

## 2020-02-19 DIAGNOSIS — E782 Mixed hyperlipidemia: Secondary | ICD-10-CM

## 2020-02-19 DIAGNOSIS — E1165 Type 2 diabetes mellitus with hyperglycemia: Secondary | ICD-10-CM

## 2020-02-19 DIAGNOSIS — E118 Type 2 diabetes mellitus with unspecified complications: Secondary | ICD-10-CM | POA: Diagnosis not present

## 2020-02-19 LAB — POCT GLYCOSYLATED HEMOGLOBIN (HGB A1C): Hemoglobin A1C: 11.1 % — AB (ref 4.0–5.6)

## 2020-02-19 NOTE — Progress Notes (Signed)
02/19/2020   Endocrinology follow-up note  Subjective:    Patient ID: Emily Leon, female    DOB: Feb 17, 1951,    Past Medical History:  Diagnosis Date  . Arthritis   . Bronchitis    hx of   . Cataracts, bilateral   . Chronic kidney disease   . Cough   . Diabetes mellitus without complication (HCC)   . Hypertension   . Shortness of breath dyspnea    walking distances or climbing stairs  . Sleep apnea    uses CPAP  . Tingling    toes bilat   . Urinary frequency    Past Surgical History:  Procedure Laterality Date  . ABDOMINAL HYSTERECTOMY    . BREATH TEK H PYLORI N/A 08/26/2014   Procedure: BREATH TEK H PYLORI;  Surgeon: Gaynelle Adu, MD;  Location: [facility];  Service: General;  Laterality: N/A;  . KNEE SURGERY Left 2009  . LAPAROSCOPIC GASTRIC SLEEVE RESECTION N/A 11/11/2014   Procedure: LAPAROSCOPIC GASTRIC SLEEVE RESECTION;  Surgeon: Gaynelle Adu, MD;  Location: [facility];  Service: General;  Laterality: N/A;  . UPPER GI ENDOSCOPY  11/11/2014   Procedure: UPPER GI ENDOSCOPY;  Surgeon: Gaynelle Adu, MD;  Location: [facility];  Service: General;;   Social History   Socioeconomic History  . Marital status: Married    Spouse name: Not on file  . Number of children: Not on file  . Years of education: Not on file  . Highest education level: Not on file  Occupational History  . Not on file  Tobacco Use  . Smoking status: Never Smoker  . Smokeless tobacco: Never Used  Vaping Use  . Vaping Use: Never used  Substance and Sexual Activity  . Alcohol use: No  . Drug use: No  . Sexual activity: Not on file  Other Topics Concern  . Not on file  Social History Narrative  . Not on file   Social Determinants of Health   Financial Resource Strain:   . Difficulty of Paying Living Expenses: Not on file  Food Insecurity:   . Worried About Programme researcher, broadcasting/film/video in the Last Year: Not on file  . Ran Out of Food in the Last Year: Not on file  Transportation Needs:   . Lack  of Transportation (Medical): Not on file  . Lack of Transportation (Non-Medical): Not on file  Physical Activity:   . Days of Exercise per Week: Not on file  . Minutes of Exercise per Session: Not on file  Stress:   . Feeling of Stress : Not on file  Social Connections:   . Frequency of Communication with Friends and Family: Not on file  . Frequency of Social Gatherings with Friends and Family: Not on file  . Attends Religious Services: Not on file  . Active Member of Clubs or Organizations: Not on file  . Attends Banker Meetings: Not on file  . Marital Status: Not on file   Outpatient Encounter Medications as of 02/19/2020  Medication Sig  . amLODipine (NORVASC) 10 MG tablet Take 10 mg by mouth every morning.  Marland Kitchen atenolol (TENORMIN) 100 MG tablet Take 100 mg by mouth every morning.   . B-D UF III MINI PEN NEEDLES 31G X 5 MM MISC USE 4 TIMES DAILY AS DIRECTED  . Continuous Blood Gluc Sensor (FREESTYLE LIBRE 14 DAY SENSOR) MISC 1 each by Does not apply route every 14 (fourteen) days.  . ergocalciferol (VITAMIN D2) 50000 UNITS capsule  Take 50,000 Units by mouth every Monday.  . ezetimibe-simvastatin (VYTORIN) 10-40 MG tablet Take 1 tablet by mouth at bedtime.  . hydrochlorothiazide (HYDRODIURIL) 25 MG tablet Take 12.5 mg by mouth every morning.   . insulin regular human CONCENTRATED (HUMULIN R U-500 KWIKPEN) 500 UNIT/ML kwikpen Inject 40-60 Units into the skin 3 (three) times daily with meals.  Marland Kitchen loratadine (CLARITIN) 10 MG tablet Take 10 mg by mouth daily.  . metFORMIN (GLUCOPHAGE-XR) 500 MG 24 hr tablet TAKE 2 TABLETS BY MOUTH EVERY DAY WITH BREAKFAST  . ondansetron (ZOFRAN ODT) 4 MG disintegrating tablet Take 1 tablet (4 mg total) by mouth every 8 (eight) hours as needed for nausea or vomiting.  Marland Kitchen oxyCODONE (ROXICODONE) 5 MG/5ML solution Take 5-10 mLs (5-10 mg total) by mouth every 4 (four) hours as needed for moderate pain or severe pain.  . pantoprazole (PROTONIX) 40 MG  tablet Take 1 tablet (40 mg total) by mouth daily.  . TRULICITY 1.5 MG/0.5ML SOPN INJECT 1.5 MG UNDER THE SKIN ONCE A WEEK  . valsartan (DIOVAN) 160 MG tablet Take 160 mg by mouth every morning.    No facility-administered encounter medications on file as of 02/19/2020.   ALLERGIES: Allergies  Allergen Reactions  . Penicillins Itching and Rash  . Sulfa Antibiotics Itching and Rash   VACCINATION STATUS:  There is no immunization history on file for this patient.  Diabetes She presents for her follow-up diabetic visit. She has type 2 diabetes mellitus. Onset time: She was diagnosed at approximate age of 27 years. Her disease course has been worsening. There are no hypoglycemic associated symptoms. Pertinent negatives for hypoglycemia include no confusion, pallor or seizures. Associated symptoms include polydipsia and polyuria. Pertinent negatives for diabetes include no fatigue and no polyphagia. There are no hypoglycemic complications. Symptoms are worsening. There are no diabetic complications. Risk factors for coronary artery disease include dyslipidemia, diabetes mellitus, obesity, hypertension and sedentary lifestyle. Current diabetic treatment includes intensive insulin program. She is compliant with treatment some of the time. Her weight is decreasing steadily. She is following a generally unhealthy diet. When asked about meal planning, she reported none. Prior visit with dietitian: She will be scheduled with a dietitian here. She never participates in exercise. Her home blood glucose trend is increasing steadily. Her breakfast blood glucose range is generally 180-200 mg/dl. Her lunch blood glucose range is generally >200 mg/dl. Her dinner blood glucose range is generally >200 mg/dl. Her bedtime blood glucose range is generally >200 mg/dl. Her overall blood glucose range is 180-200 mg/dl. (She presents with her CGM device.  Printouts show only 22% time in range, 77% above range, 0% hypoglycemia.   Her point-of-care A1c is 11.1% increasing from 9.7%.   ) An ACE inhibitor/angiotensin II receptor blocker is being taken. Eye exam is current.  Hyperlipidemia This is a chronic problem. The current episode started more than 1 year ago. The problem is uncontrolled. Exacerbating diseases include diabetes and obesity. Pertinent negatives include no myalgias. Current antihyperlipidemic treatment includes statins. Risk factors for coronary artery disease include diabetes mellitus, dyslipidemia, hypertension, obesity and a sedentary lifestyle.    Review of systems:  Constitutional: + weight loss, no fatigue, no subjective hyperthermia, no subjective hypothermia Eyes: no blurry vision, no xerophthalmia ENT: no sore throat, no nodules palpated in throat, no dysphagia/odynophagia, no hoarseness Cardiovascular: no Chest Pain, no Shortness of Breath, no palpitations, no leg swelling Respiratory: no cough, no SOB Gastrointestinal: no Nausea/Vomiting/Diarhhea Musculoskeletal: no muscle/joint aches Skin: no rashes Neurological:  no tremors, no numbness, no tingling, no dizziness Psychiatric: no depression, no anxiety   Objective:    BP (!) 146/84   Pulse 76   Ht 5\' 9"  (1.753 m)   Wt 277 lb 6.4 oz (125.8 kg)   BMI 40.96 kg/m   Wt Readings from Last 3 Encounters:  02/19/20 277 lb 6.4 oz (125.8 kg)  12/19/19 286 lb (129.7 kg)  11/20/19 290 lb 12.8 oz (131.9 kg)     Physical Exam- Limited  Constitutional:  Body mass index is 40.96 kg/m. , not in acute distress, normal state of mind Eyes:  EOMI, no exophthalmos Neck: Supple Respiratory: Adequate breathing efforts Musculoskeletal: no gross deformities, strength intact in all four extremities, no gross restriction of joint movements Skin:  no rashes, no hyperemia Neurological: no tremor with outstretched hands.   Complete Blood Count (Most recent): Lab Results  Component Value Date   WBC 11.9 (H) 11/13/2014   HGB 13.4 11/13/2014   HCT  41.3 11/13/2014   MCV 90.2 11/13/2014   PLT 317 11/13/2014   Chemistry (most recent): Lab Results  Component Value Date   NA 139 10/15/2019   K 4.3 10/15/2019   CL 104 10/15/2019   CO2 32 (A) 10/15/2019   BUN 13 10/15/2019   CREATININE 1.0 10/15/2019   Diabetic Labs (most recent): Lab Results  Component Value Date   HGBA1C 11.1 (A) 02/19/2020   HGBA1C 9.7 (A) 11/20/2019   HGBA1C 9.1 (A) 04/25/2019    Lipid Panel     Component Value Date/Time   CHOL 203 (A) 10/15/2019 0000   TRIG 118 10/15/2019 0000   HDL 55 10/15/2019 0000   LDLCALC 124 10/15/2019 0000     Assessment & Plan:   1. Uncontrolled type 2 diabetes mellitus with complication, with long-term current use of insulin (HCC)  Her diabetes is  complicated by obesity status post  Sleeve bariatric surgery which resulted in  Modest 40 lbs of weight loss and patient remains at a high risk for more acute and chronic complications of diabetes which include CAD, CVA, CKD, retinopathy, and neuropathy. These are all discussed in detail with the patient.  She presents with her CGM device.  Printouts show only 22% time in range, 77% above range, 0% hypoglycemia.  Her point-of-care A1c is 11.1% increasing from 9.7%.    -- she  admits there is a room for improvement in her diet and drink choices. -  Suggestion is made for her to avoid simple carbohydrates  from her diet including Cakes, Sweet Desserts / Pastries, Ice Cream, Soda (diet and regular), Sweet Tea, Candies, Chips, Cookies, Sweet Pastries,  Store Bought Juices, Alcohol in Excess of  1-2 drinks a day, Artificial Sweeteners, Coffee Creamer, and "Sugar-free" Products. This will help patient to have stable blood glucose profile and potentially avoid unintended weight gain.  - I have approached patient with the following individualized plan to manage diabetes and patient agrees.  -She has reversed all the gains of her sleeve gastrectomy.    Evidently, she is benefiting from  the new 500 insulin.  She is assured that tightening of blood glucose is expected from better action of her new insulin. -She worries about hypoglycemia even though she has 0% hypoglycemia below 60, 1% between 55 and 70, 22% target , 77 significantly above target glycemic profile.     -I discussed and lowered her dose to 40 units at supper, advised to increase U50 0 to 60 units with breakfast and lunch  for premeal blood glucose readings above 90 mg per DL. - I advised her to document blood glucose at least 4 times a day, before meals and at bedtime.  - She is warned not to take insulin without documenting blood glucose off of the sensor.  -She is advised not to inject insulin at bedtime. -She is advised to continue Glumetza 1000 mg extended release 1 time a day with breakfast.    -She is advised to continue Trulicity 1.5 mg subcutaneously weekly.    2) hypertension: -Her blood pressure is not controlled to target.  She is advised to continue her current blood pressure medications including valsartan 160 mg p.o. daily in the morning with breakfast.    3) Lipids/HPL: Her last lipid panel showed uncontrolled LDL at 119.   She is advised to continue Vytorin.  She is advised to continue continue Vytorin.  She complains of leg cramps.  She will be referred to vascular surgery for evaluation management  4) weight management: Her current BMI is 42.94.  This clearly complicates her diabetes care.  She is status post sleeve gastrectomy in the past with successful loss of 40+ pound weight.  However, more recently, she is gaining weight. See above.  I advised patient to maintain close follow up with her PCP for primary care needs.   - Time spent on this patient care encounter:  35 min, of which > 50% was spent in  counseling and the rest reviewing her blood glucose logs , discussing her hypoglycemia and hyperglycemia episodes, reviewing her current and  previous labs / studies  ( including abstraction  from other facilities) and medications  doses and developing a  long term treatment plan and documenting her care.   Please refer to Patient Instructions for Blood Glucose Monitoring and Insulin/Medications Dosing Guide"  in media tab for additional information. Please  also refer to " Patient Self Inventory" in the Media  tab for reviewed elements of pertinent patient history.  Edison SimonBarbaretta S Bathgate participated in the discussions, expressed understanding, and voiced agreement with the above plans.  All questions were answered to her satisfaction. she is encouraged to contact clinic should she have any questions or concerns prior to her return visit.   Follow up plan: Return in about 4 weeks (around 03/18/2020) for F/U with Meter and Logs Only - no Labs.  Marquis LunchGebre Felipe Paluch, MD Phone: [phone]  Fax: [phone]  This note was partially dictated with voice recognition software. Similar sounding words can be transcribed inadequately or may not  be corrected upon review.  02/19/2020, 8:16 PM

## 2020-02-19 NOTE — Patient Instructions (Signed)

## 2020-02-20 ENCOUNTER — Ambulatory Visit: Payer: Medicare Other | Admitting: "Endocrinology

## 2020-03-04 ENCOUNTER — Ambulatory Visit: Payer: Medicare Other | Admitting: Internal Medicine

## 2020-03-16 ENCOUNTER — Ambulatory Visit: Payer: Medicare Other | Admitting: Internal Medicine

## 2020-03-16 ENCOUNTER — Other Ambulatory Visit: Payer: Self-pay

## 2020-03-16 ENCOUNTER — Encounter: Payer: Self-pay | Admitting: Internal Medicine

## 2020-03-16 VITALS — BP 138/78 | HR 112 | Ht 69.0 in | Wt 270.8 lb

## 2020-03-16 DIAGNOSIS — E1142 Type 2 diabetes mellitus with diabetic polyneuropathy: Secondary | ICD-10-CM

## 2020-03-16 DIAGNOSIS — E1165 Type 2 diabetes mellitus with hyperglycemia: Secondary | ICD-10-CM

## 2020-03-16 MED ORDER — NOVOLOG FLEXPEN 100 UNIT/ML ~~LOC~~ SOPN: 20.0000 [IU] | PEN_INJECTOR | Freq: Three times a day (TID) | SUBCUTANEOUS | 3 refills | Status: DC

## 2020-03-16 MED ORDER — TRESIBA FLEXTOUCH 200 UNIT/ML ~~LOC~~ SOPN: 30.0000 [IU] | PEN_INJECTOR | Freq: Every day | SUBCUTANEOUS | 3 refills | Status: DC

## 2020-03-16 NOTE — Patient Instructions (Addendum)
Please continue: - Metformin ER 1000 mg with breakfast - Trulicity 1.5 mg weekly  Please stop the U500 for now and switch to: - Tresiba 30-40 units daily - Novolog 20-30 units 15 min before a meal  Please let me know if the sugars are consistently <80 or >200.  Please return in 1.5-2 months with your sugar log.   PATIENT INSTRUCTIONS FOR TYPE 2 DIABETES:  **Please join MyChart!** - see attached instructions about how to join if you have not done so already.  DIET AND EXERCISE Diet and exercise is an important part of diabetic treatment.  We recommended aerobic exercise in the form of brisk walking (working between 40-60% of maximal aerobic capacity, similar to brisk walking) for 150 minutes per week (such as 30 minutes five days per week) along with 3 times per week performing 'resistance' training (using various gauge rubber tubes with handles) 5-10 exercises involving the major muscle groups (upper body, lower body and core) performing 10-15 repetitions (or near fatigue) each exercise. Start at half the above goal but build slowly to reach the above goals. If limited by weight, joint pain, or disability, we recommend daily walking in a swimming pool with water up to waist to reduce pressure from joints while allow for adequate exercise.    BLOOD GLUCOSES Monitoring your blood glucoses is important for continued management of your diabetes. Please check your blood glucoses 2-4 times a day: fasting, before meals and at bedtime (you can rotate these measurements - e.g. one day check before the 3 meals, the next day check before 2 of the meals and before bedtime, etc.).   HYPOGLYCEMIA (low blood sugar) Hypoglycemia is usually a reaction to not eating, exercising, or taking too much insulin/ other diabetes drugs.  Symptoms include tremors, sweating, hunger, confusion, headache, etc. Treat IMMEDIATELY with 15 grams of Carbs: . 4 glucose tablets .  cup regular juice/soda . 2 tablespoons  raisins . 4 teaspoons sugar . 1 tablespoon honey Recheck blood glucose in 15 mins and repeat above if still symptomatic/blood glucose <100.  RECOMMENDATIONS TO REDUCE YOUR RISK OF DIABETIC COMPLICATIONS: * Take your prescribed MEDICATION(S) * Follow a DIABETIC diet: Complex carbs, fiber rich foods, (monounsaturated and polyunsaturated) fats * AVOID saturated/trans fats, high fat foods, >2,300 mg salt per day. * EXERCISE at least 5 times a week for 30 minutes or preferably daily.  * DO NOT SMOKE OR DRINK more than 1 drink a day. * Check your FEET every day. Do not wear tightfitting shoes. Contact us if you develop an ulcer * See your EYE doctor once a year or more if needed * Get a FLU shot once a year * Get a PNEUMONIA vaccine once before and once after age 64 years  GOALS:  * Your Hemoglobin A1c of <7%  * fasting sugars need to be <130 * after meals sugars need to be <180 (2h after you start eating) * Your Systolic BP should be 140 or lower  * Your Diastolic BP should be 80 or lower  * Your HDL (Good Cholesterol) should be 40 or higher  * Your LDL (Bad Cholesterol) should be 100 or lower. * Your Triglycerides should be 150 or lower  * Your Urine microalbumin (kidney function) should be <30 * Your Body Mass Index should be 25 or lower    Please consider the following ways to cut down carbs and fat and increase fiber and micronutrients in your diet: - substitute whole grain for white bread or  pasta - substitute brown rice for white rice - substitute 90-calorie flat bread pieces for slices of bread when possible - substitute sweet potatoes or yams for white potatoes - substitute humus for margarine - substitute tofu for cheese when possible - substitute almond or rice milk for regular milk (would not drink soy milk daily due to concern for soy estrogen influence on breast cancer risk) - substitute dark chocolate for other sweets when possible - substitute water - can add lemon or  orange slices for taste - for diet sodas (artificial sweeteners will trick your body that you can eat sweets without getting calories and will lead you to overeating and weight gain in the long run) - do not skip breakfast or other meals (this will slow down the metabolism and will result in more weight gain over time)  - can try smoothies made from fruit and almond/rice milk in am instead of regular breakfast - can also try old-fashioned (not instant) oatmeal made with almond/rice milk in am - order the dressing on the side when eating salad at a restaurant (pour less than half of the dressing on the salad) - eat as little meat as possible - can try juicing, but should not forget that juicing will get rid of the fiber, so would alternate with eating raw veg./fruits or drinking smoothies - use as little oil as possible, even when using olive oil - can dress a salad with a mix of balsamic vinegar and lemon juice, for e.g. - use agave nectar, stevia sugar, or regular sugar rather than artificial sweateners - steam or broil/roast veggies  - snack on veggies/fruit/nuts (unsalted, preferably) when possible, rather than processed foods - reduce or eliminate aspartame in diet (it is in diet sodas, chewing gum, etc) Read the labels!  Try to read Dr. Katherina Right book: "Program for Reversing Diabetes" for other ideas for healthy eating.

## 2020-03-16 NOTE — Progress Notes (Addendum)
Patient ID: Emily Leon, female   DOB: 1950/07/29, 69 y.o.   MRN: [id number]   This visit occurred during the SARS-CoV-2 public health emergency.  Safety protocols were in place, including screening questions prior to the visit, additional usage of staff PPE, and extensive cleaning of exam room while observing appropriate contact time as indicated for disinfecting solutions.   HPI: Emily Leon is a 69 y.o.-year-old female, referred by her PCP, Dr. Celine Mans, for management of DM2, dx at 69 y/o, insulin-dependent, uncontrolled, with long-term complications (CKD, PN, fatty liver).  She previously saw Dr. Fransico Him, last visit with him 02/19/2020.  Reviewed HbA1c: Lab Results  Component Value Date   HGBA1C 11.1 (A) 02/19/2020   HGBA1C 9.7 (A) 11/20/2019   HGBA1C 9.1 (A) 04/25/2019   HGBA1C 8.8 11/22/2018   HGBA1C 9.7 07/10/2018   HGBA1C 9.6 11/06/2017   HGBA1C 9.6 06/26/2017   HGBA1C 9.2 03/23/2017   HGBA1C 8.7 12/20/2016   HGBA1C 8.9 07/22/2016   Pt is on a regimen of: - Metformin ER 1000 mg with breakfast and lunch - Trulicity 1.5 mg weekly - U500 insulin 60-60-40 units 3x a day started 2 mo >> now 60 units before B b/c 40s and 50s Prev. On Novolog and [place].  She checks her sugars more than 4 times a day with her freestyle libre CGM.  Freestyle libre CGM parameters: - Average: 251 - % active CGM time: 88% of the time - Glucose variability 26.2% (target < or = to 36%) - time in range:  - very low (<54): 0% - low (54-69): 0% - normal range (70-180):16% - high sugars (181-250): 35% - very high sugars (>250): 49%    Lowest sugar was 40 (when she was taking U500 insulin later in the day, also); she has hypoglycemia awareness at 70.  Highest sugar was 300s.  Pt's meals are: - Breakfast: egg, toast, fruit - Lunch: salad and fruit - Dinner: baked chicken, salad, fruit - Snacks: -  - + mild CKD, last BUN/creatinine:  Lab Results  Component Value Date   BUN 13 10/15/2019    BUN 11 11/22/2018   CREATININE 1.0 10/15/2019   CREATININE 1.2 (A) 11/22/2018  On valsartan 160 mg daily  - + HL; last set of lipids: Lab Results  Component Value Date   CHOL 203 (A) 10/15/2019   HDL 55 10/15/2019   LDLCALC 124 10/15/2019   TRIG 118 10/15/2019  On ezetimibe-simvastatin 10-40 mg daily.  - last eye exam was in 02/2020. No DR reportedly. + macular edema. Has a retina specialist in [place].   - + numbness and tingling in her feet. On B12 and Benfotiamine.  Pt has FH of DM in sister and unclear in mother  She also has a history of sleeve gastrectomy, OSA-on CPAP.  ROS: Constitutional: + weight gain, no weight loss, no fatigue, + subjective hyperthermia, no subjective hypothermia, no nocturia Eyes: no blurry vision, no xerophthalmia ENT: no sore throat, no nodules palpated in neck, no dysphagia, no odynophagia, no hoarseness, no tinnitus, no hypoacusis Cardiovascular: no CP, no SOB, no palpitations, no leg swelling Respiratory: no cough, no SOB, no wheezing Gastrointestinal: no N, no V, no D, no C, no acid reflux Musculoskeletal: no muscle, + joint aches Skin: no rash, no hair loss Neurological: no tremors, no numbness or tingling/no dizziness/no HAs Psychiatric: no depression, no anxiety + low libido  Past Medical History:  Diagnosis Date  . Arthritis   . Bronchitis    hx  of   . Cataracts, bilateral   . Chronic kidney disease   . Cough   . Diabetes mellitus without complication (HCC)   . Hypertension   . Shortness of breath dyspnea    walking distances or climbing stairs  . Sleep apnea    uses CPAP  . Tingling    toes bilat   . Urinary frequency    Past Surgical History:  Procedure Laterality Date  . ABDOMINAL HYSTERECTOMY    . BREATH TEK H PYLORI N/A 08/26/2014   Procedure: BREATH TEK H PYLORI;  Surgeon: Gaynelle Adu, MD;  Location: [facility];  Service: General;  Laterality: N/A;  . KNEE SURGERY Left 2009  . LAPAROSCOPIC GASTRIC SLEEVE  RESECTION N/A 11/11/2014   Procedure: LAPAROSCOPIC GASTRIC SLEEVE RESECTION;  Surgeon: Gaynelle Adu, MD;  Location: [facility];  Service: General;  Laterality: N/A;  . UPPER GI ENDOSCOPY  11/11/2014   Procedure: UPPER GI ENDOSCOPY;  Surgeon: Gaynelle Adu, MD;  Location: [facility];  Service: General;;   Social History   Socioeconomic History  . Marital status: Married    Spouse name: Not on file  . Number of children: 1  . Years of education: Not on file  . Highest education level: Not on file  Occupational History  . Occupation: Retired  Tobacco Use  . Smoking status: Never Smoker  . Smokeless tobacco: Never Used  Vaping Use  . Vaping Use: Never used  Substance and Sexual Activity  . Alcohol use: No  . Drug use: No  . Sexual activity: Not on file  Other Topics Concern  . Not on file  Social History Narrative  . Not on file   Social Determinants of Health   Financial Resource Strain:   . Difficulty of Paying Living Expenses: Not on file  Food Insecurity:   . Worried About Programme researcher, broadcasting/film/video in the Last Year: Not on file  . Ran Out of Food in the Last Year: Not on file  Transportation Needs:   . Lack of Transportation (Medical): Not on file  . Lack of Transportation (Non-Medical): Not on file  Physical Activity:   . Days of Exercise per Week: Not on file  . Minutes of Exercise per Session: Not on file  Stress:   . Feeling of Stress : Not on file  Social Connections:   . Frequency of Communication with Friends and Family: Not on file  . Frequency of Social Gatherings with Friends and Family: Not on file  . Attends Religious Services: Not on file  . Active Member of Clubs or Organizations: Not on file  . Attends Banker Meetings: Not on file  . Marital Status: Not on file  Intimate Partner Violence:   . Fear of Current or Ex-Partner: Not on file  . Emotionally Abused: Not on file  . Physically Abused: Not on file  . Sexually Abused: Not on file   Current  Outpatient Medications on File Prior to Visit  Medication Sig Dispense Refill  . amLODipine (NORVASC) 10 MG tablet Take 10 mg by mouth every morning.    Marland Kitchen atenolol (TENORMIN) 100 MG tablet Take 100 mg by mouth every morning.     . B-D UF III MINI PEN NEEDLES 31G X 5 MM MISC USE 4 TIMES DAILY AS DIRECTED 100 each 2  . Continuous Blood Gluc Sensor (FREESTYLE LIBRE 14 DAY SENSOR) MISC 1 each by Does not apply route every 14 (fourteen) days. 3 each 5  . ergocalciferol (  VITAMIN D2) 50000 UNITS capsule Take 50,000 Units by mouth every Monday.    . ezetimibe-simvastatin (VYTORIN) 10-40 MG tablet Take 1 tablet by mouth at bedtime. 30 tablet 2  . hydrochlorothiazide (HYDRODIURIL) 25 MG tablet Take 12.5 mg by mouth every morning.     . loratadine (CLARITIN) 10 MG tablet Take 10 mg by mouth daily.    . metFORMIN (GLUCOPHAGE-XR) 500 MG 24 hr tablet TAKE 2 TABLETS BY MOUTH EVERY DAY WITH BREAKFAST 60 tablet 2  . ondansetron (ZOFRAN ODT) 4 MG disintegrating tablet Take 1 tablet (4 mg total) by mouth every 8 (eight) hours as needed for nausea or vomiting. 30 tablet 0  . oxyCODONE (ROXICODONE) 5 MG/5ML solution Take 5-10 mLs (5-10 mg total) by mouth every 4 (four) hours as needed for moderate pain or severe pain.  0  . pantoprazole (PROTONIX) 40 MG tablet Take 1 tablet (40 mg total) by mouth daily. 30 tablet 0  . TRULICITY 1.5 MG/0.5ML SOPN INJECT 1.5 MG UNDER THE SKIN ONCE A WEEK 6 mL 1  . valsartan (DIOVAN) 160 MG tablet Take 160 mg by mouth every morning.      No current facility-administered medications on file prior to visit.  + U500 insulin 60 units at b'fast  Allergies  Allergen Reactions  . Penicillins Itching and Rash  . Sulfa Antibiotics Itching and Rash   Family History  Problem Relation Age of Onset  . Diabetes Mother   . Diabetes Sister    PE: BP 138/78   Pulse (!) 112   Ht 5\' 9"  (1.753 m)   Wt 270 lb 12.8 oz (122.8 kg)   SpO2 95%   BMI 39.99 kg/m  Wt Readings from Last 3  Encounters:  03/16/20 270 lb 12.8 oz (122.8 kg)  02/19/20 277 lb 6.4 oz (125.8 kg)  12/19/19 286 lb (129.7 kg)   Constitutional: overweight, in NAD Eyes: PERRLA, EOMI, no exophthalmos ENT: moist mucous membranes, no thyromegaly, no cervical lymphadenopathy Cardiovascular: Tachycardia, RR, No MRG Respiratory: CTA B Gastrointestinal: abdomen soft, NT, ND, BS+ Musculoskeletal: no deformities, strength intact in all 4 Skin: moist, warm, no rashes Neurological: no tremor with outstretched hands, DTR normal in all 4  ASSESSMENT: 1. DM2, insulin-dependent, uncontrolled, with long-term complications - mild CKD - fatty liver - PN  PLAN:  1. Patient with long-standing, uncontrolled diabetes, on oral antidiabetic regimen (Metformin ER), concentrated insulin (U500) and GLP-1 receptor agonist (Trulicity), which became insufficient.  Latest HbA1c was 11.1% at the beginning of the month. -She tells me that she has had problems with her blood sugars ever since she started concentrated insulin approximately 2 months ago.  She was initially on the regimen of 60-60-40 units with meals (she was taking the insulin at the start of the meal, rather than 30 minutes before). This regimen caused significant problems with nocturnal hypoglycemia (sugars in the 40s and 50s) and she eliminated the evening and the lunchtime doses and effort to improve her sugars overnight. CGM interpretation: -At today's visit, we reviewed her CGM downloads: It appears that 16% of values are in target range, while 84% are higher than 180, and 0% are lower than 70 now, after eliminating the lunch and dinner U500 insulin doses.  The calculated average blood sugar is 251, very high.  The projected HbA1c for the next 3 months (GMI) is 9.3%, still better than the last HbA1c that was measured at 11.1%. -Reviewing the CGM trends, it appears that her sugars are high throughout the day  and night, but they increase significantly starting  approximately 4 AM, before breakfast or coffee.  I suspect that by that time, the U500 insulin is out of her system.  Her sugars peak after breakfast, which is at approximately 9 AM and they start to decrease afterwards with a much smaller post dinner hyperglycemic peak. -She is convinced that the U500 insulin is not a good fit for her.  It does appear that this insulin stays longer in her system and I agree that we can try again to switch to Levemir-NovoLog regimen and may need to go back to U500 insulin afterwards but with a more conservative regimen.  Will avoid low blood sugars overnight, I advised him to start with 30 units of Levemir and increase to 40 if sugars are not controlled afterwards.  I advised her to inject NovoLog before each meal, initially starting with 20 units and increasing to 30 if needed especially with larger meals.  I advised her to join my chart and stay in touch with me and we can continue to adjust the doses as needed before her next visit.  For now, we will continue Metformin and Trulicity at the same dose.  We could increase visit at next visit but she is now complaining of decreased appetite so we will stay on the same dose for now. - I suggested to:  Patient Instructions  Please continue: - Metformin ER 1000 mg with breakfast - Trulicity 1.5 mg weekly  Please stop the U500 for now and switch to: - Tresiba 30-40 units daily - Novolog 20-30 units 15 min before a meal  Please let me know if the sugars are consistently <80 or >200.  Please return in 1.5-2 months with your sugar log.   - Strongly advised her to start checking sugars at different times of the day - check 4x a day, rotating checks - discussed about CBG targets for treatment: 80-130 mg/dL before meals and <846 mg/dL after meals; target [id number] <7%. - given sugar log and advised how to fill it and to bring it at next appt  - given foot care handout and explained the principles  - given instructions for  hypoglycemia management "15-15 rule"  - advised for yearly eye exams  - Return to clinic in 2 mo   Carlus Pavlov, MD PhD [facility] Endocrinology

## 2020-03-19 ENCOUNTER — Telehealth: Payer: Self-pay | Admitting: "Endocrinology

## 2020-03-19 NOTE — Telephone Encounter (Signed)
Pt called and requested to cancel her future appts here at [facility], pt states she will be seeing a new dr.

## 2020-03-23 ENCOUNTER — Ambulatory Visit: Payer: Medicare Other | Admitting: "Endocrinology

## 2020-04-30 ENCOUNTER — Other Ambulatory Visit: Payer: Self-pay

## 2020-04-30 ENCOUNTER — Encounter: Payer: Self-pay | Admitting: Internal Medicine

## 2020-04-30 ENCOUNTER — Ambulatory Visit: Payer: Medicare Other | Admitting: Internal Medicine

## 2020-04-30 VITALS — BP 128/80 | HR 71 | Ht 69.0 in | Wt 273.4 lb

## 2020-04-30 DIAGNOSIS — E7800 Pure hypercholesterolemia, unspecified: Secondary | ICD-10-CM

## 2020-04-30 DIAGNOSIS — E669 Obesity, unspecified: Secondary | ICD-10-CM | POA: Diagnosis not present

## 2020-04-30 DIAGNOSIS — E1165 Type 2 diabetes mellitus with hyperglycemia: Secondary | ICD-10-CM

## 2020-04-30 DIAGNOSIS — E1142 Type 2 diabetes mellitus with diabetic polyneuropathy: Secondary | ICD-10-CM | POA: Diagnosis not present

## 2020-04-30 LAB — POCT GLYCOSYLATED HEMOGLOBIN (HGB A1C): Hemoglobin A1C: 10.7 % — AB (ref 4.0–5.6)

## 2020-04-30 MED ORDER — TRULICITY 3 MG/0.5ML ~~LOC~~ SOAJ
3.0000 mg | SUBCUTANEOUS | 3 refills | Status: DC
Start: 2020-04-30 — End: 2021-08-16

## 2020-04-30 NOTE — Patient Instructions (Addendum)
Please continue: - Metformin ER 1000 mg with breakfast and lunch  Please increase: - Trulicity 3 mg weekly  Please increase: - Tresiba 36-40 units daily - Novolog 20-26 units 15 min before a meal  Please return in 3 months.

## 2020-04-30 NOTE — Addendum Note (Signed)
Addended by: Kenyon Ana on: 04/30/2020 02:18 PM   Modules accepted: Orders

## 2020-04-30 NOTE — Progress Notes (Signed)
Patient ID: Emily Leon, female   DOB: 11/07/1950, 70 y.o.   MRN: [id number]   This visit occurred during the SARS-CoV-2 public health emergency.  Safety protocols were in place, including screening questions prior to the visit, additional usage of staff PPE, and extensive cleaning of exam room while observing appropriate contact time as indicated for disinfecting solutions.   HPI: Emily Leon is a 70 y.o.-year-old female, initially referred by her PCP, Dr. Celine Mans, returning for follow-up for DM2, dx at 70 y/o, insulin-dependent, uncontrolled, with long-term complications (CKD, PN, fatty liver).  She previously saw Dr. Fransico Him, last visit with him 02/19/2020.  Our first visit was 1.5 months ago.  She was on steroids for 10 days over the [facility] for an upper respiratory infection (COVID-negative) >> sugars higher.   Reviewed HbA1c levels: Lab Results  Component Value Date   HGBA1C 11.1 (A) 02/19/2020   HGBA1C 9.7 (A) 11/20/2019   HGBA1C 9.1 (A) 04/25/2019   HGBA1C 8.8 11/22/2018   HGBA1C 9.7 07/10/2018   HGBA1C 9.6 11/06/2017   HGBA1C 9.6 06/26/2017   HGBA1C 9.2 03/23/2017   HGBA1C 8.7 12/20/2016   HGBA1C 8.9 07/22/2016   At last visit, she was on: - Metformin ER 1000 mg with breakfast and lunch - Trulicity 1.5 mg weekly - U500 insulin 60-60-40 units 3x a day started 2 mo >> now 60 units before B b/c 40s and 50s Prev. On Novolog and [place].  We changed to: - Metformin ER 1000 mg with breakfast and lunch - Trulicity 1.5 mg weekly - Tresiba 30-40 units daily - Novolog 20-24  units 15 min before a meal  She checks her sugars more than 4 times a day with her freestyle libre CGM.  Freestyle libre CGM parameters: - Average: 251 - % active CGM time: 88% of the time - Glucose variability 26.2% (target < or = to 36%) - time in range:  - very low (<54): 0% - low (54-69): 0% - normal range (70-180):16% - high sugars (181-250): 35% - very high sugars (>250):  49%    Previously:   Lowest sugar was 40 (when she was taking U500 insulin later in the day, also) >> 100; she has hypoglycemia awareness at 70.  Highest sugar was 300s >> 500.  Pt's meals are: - Breakfast: egg, toast, fruit - Lunch: salad and fruit - Dinner: baked chicken, salad, fruit - Snacks: -  -+ Mild CKD, last BUN/creatinine:  Lab Results  Component Value Date   BUN 13 10/15/2019   BUN 11 11/22/2018   CREATININE 1.0 10/15/2019   CREATININE 1.2 (A) 11/22/2018  On valsartan 160 mg daily.  -+ HL; last set of lipids: Lab Results  Component Value Date   CHOL 203 (A) 10/15/2019   HDL 55 10/15/2019   LDLCALC 124 10/15/2019   TRIG 118 10/15/2019  On Zetia-simvastatin 10-40 mg daily.  - last eye exam was in 02/2020: No DR reportedly, + macular edema. Has a retina specialist in [place].   - + numbness and tingling in her feet. On B12 and Benfotiamine.  Pt has FH of DM in sister and unclear in mother  She also has a history of sleeve gastrectomy, OSA-on CPAP.  ROS: Constitutional: no weight gain/no weight loss, no fatigue, + subjective hyperthermia, no subjective hypothermia Eyes: no blurry vision, no xerophthalmia ENT: no sore throat, no nodules palpated in neck, no dysphagia, no odynophagia, no hoarseness Cardiovascular: no CP/no SOB/no palpitations/no leg swelling Respiratory: no cough/no SOB/no wheezing Gastrointestinal:  no N/no V/no D/no C/no acid reflux Musculoskeletal: no muscle aches/+ joint aches Skin: no rashes, no hair loss Neurological: no tremors/no numbness/no tingling/no dizziness  I reviewed pt's medications, allergies, PMH, social hx, family hx, and changes were documented in the history of present illness. Otherwise, unchanged from my initial visit note.  Past Medical History:  Diagnosis Date  . Arthritis   . Bronchitis    hx of   . Cataracts, bilateral   . Chronic kidney disease   . Cough   . Diabetes mellitus without complication (HCC)    . Hypertension   . Shortness of breath dyspnea    walking distances or climbing stairs  . Sleep apnea    uses CPAP  . Tingling    toes bilat   . Urinary frequency    Past Surgical History:  Procedure Laterality Date  . ABDOMINAL HYSTERECTOMY    . BREATH TEK H PYLORI N/A 08/26/2014   Procedure: BREATH TEK H PYLORI;  Surgeon: Gaynelle AduEric Wilson, MD;  [facility];  Service: General;  Laterality: N/A;  . KNEE SURGERY Left 2009  . LAPAROSCOPIC GASTRIC SLEEVE RESECTION N/A 11/11/2014   Procedure: LAPAROSCOPIC GASTRIC SLEEVE RESECTION;  Surgeon: Gaynelle AduEric Wilson, MD;  Location: [facility];  Service: General;  Laterality: N/A;  . UPPER GI ENDOSCOPY  11/11/2014   Procedure: UPPER GI ENDOSCOPY;  Surgeon: Gaynelle AduEric Wilson, MD;  Location: [facility];  Service: General;;   Social History   Socioeconomic History  . Marital status: Married    Spouse name: Not on file  . Number of children: 1  . Years of education: Not on file  . Highest education level: Not on file  Occupational History  . Occupation: Retired  Tobacco Use  . Smoking status: Never Smoker  . Smokeless tobacco: Never Used  Vaping Use  . Vaping Use: Never used  Substance and Sexual Activity  . Alcohol use: No  . Drug use: No  . Sexual activity: Not on file  Other Topics Concern  . Not on file  Social History Narrative  . Not on file   Social Determinants of Health   Financial Resource Strain: Not on file  Food Insecurity: Not on file  Transportation Needs: Not on file  Physical Activity: Not on file  Stress: Not on file  Social Connections: Not on file  Intimate Partner Violence: Not on file   Current Outpatient Medications on File Prior to Visit  Medication Sig Dispense Refill  . amLODipine (NORVASC) 10 MG tablet Take 10 mg by mouth every morning.    Marland Kitchen. atenolol (TENORMIN) 100 MG tablet Take 100 mg by mouth every morning.     . B-D UF III MINI PEN NEEDLES 31G X 5 MM MISC USE 4 TIMES DAILY AS DIRECTED 100 each 2  . Continuous  Blood Gluc Sensor (FREESTYLE LIBRE 14 DAY SENSOR) MISC 1 each by Does not apply route every 14 (fourteen) days. 3 each 5  . ergocalciferol (VITAMIN D2) 50000 UNITS capsule Take 50,000 Units by mouth every Monday.    . ezetimibe-simvastatin (VYTORIN) 10-40 MG tablet Take 1 tablet by mouth at bedtime. 30 tablet 2  . hydrochlorothiazide (HYDRODIURIL) 25 MG tablet Take 12.5 mg by mouth every morning.     . insulin aspart (NOVOLOG FLEXPEN) 100 UNIT/ML FlexPen Inject 20-30 Units into the skin 3 (three) times daily before meals. 45 mL 3  . insulin degludec (TRESIBA FLEXTOUCH) 200 UNIT/ML FlexTouch Pen Inject 30-40 Units into the skin daily. 9 mL 3  .  loratadine (CLARITIN) 10 MG tablet Take 10 mg by mouth daily.    . metFORMIN (GLUCOPHAGE-XR) 500 MG 24 hr tablet TAKE 2 TABLETS BY MOUTH EVERY DAY WITH BREAKFAST 60 tablet 2  . ondansetron (ZOFRAN ODT) 4 MG disintegrating tablet Take 1 tablet (4 mg total) by mouth every 8 (eight) hours as needed for nausea or vomiting. 30 tablet 0  . oxyCODONE (ROXICODONE) 5 MG/5ML solution Take 5-10 mLs (5-10 mg total) by mouth every 4 (four) hours as needed for moderate pain or severe pain.  0  . pantoprazole (PROTONIX) 40 MG tablet Take 1 tablet (40 mg total) by mouth daily. 30 tablet 0  . TRULICITY 1.5 MG/0.5ML SOPN INJECT 1.5 MG UNDER THE SKIN ONCE A WEEK 6 mL 1  . valsartan (DIOVAN) 160 MG tablet Take 160 mg by mouth every morning.      No current facility-administered medications on file prior to visit.   Allergies  Allergen Reactions  . Penicillins Itching and Rash  . Sulfa Antibiotics Itching and Rash   Family History  Problem Relation Age of Onset  . Diabetes Mother   . Diabetes Sister    PE: BP 128/80   Pulse 71   Ht 5\' 9"  (1.753 m)   Wt 273 lb 6.4 oz (124 kg)   SpO2 97%   BMI 40.37 kg/m  Wt Readings from Last 3 Encounters:  04/30/20 273 lb 6.4 oz (124 kg)  03/16/20 270 lb 12.8 oz (122.8 kg)  02/19/20 277 lb 6.4 oz (125.8 kg)   Constitutional:  overweight, in NAD Eyes: PERRLA, EOMI, no exophthalmos ENT: moist mucous membranes, no thyromegaly, no cervical lymphadenopathy Cardiovascular: RRR, No MRG Respiratory: CTA B Gastrointestinal: abdomen soft, NT, ND, BS+ Musculoskeletal: no deformities, strength intact in all 4 Skin: moist, warm, no rashes Neurological: no tremor with outstretched hands, DTR normal in all 4  ASSESSMENT: 1. DM2, insulin-dependent, uncontrolled, with long-term complications - mild CKD - fatty liver - PN  2. HL  3.  Obesity class II  PLAN:  1. Patient with longstanding, uncontrolled insulin resistance type 2 diabetes, on oral medication with metformin ER, also weekly GLP-1 receptor agonist and basal-bolus insulin regimen, changed at last visit.  At that time, HbA1c was 11.1% and she was having very fluctuating blood sugars on U500 concentrated insulin.  She was convinced that the U500 insulin was not a good fit for her since her sugars were dropping to the 40s and 50s overnight if she took more than 1 dose of insulin a day.  Therefore, she was only taking 500 in the morning, before breakfast.  Per review of her CGM download, sugars were very high throughout the day, but especially midday.  16% of the blood glucose values were in target range, while 84% were higher than 180. -At last visit, we discussed about options for treatment and we decided to stop the concentrated insulin and switch to a basal/bolus insulin regimen.  I suggested 13/03/21 and NovoLog and I advised her how to vary the doses based on the size of the meal that she plans to eat and also the sugars before the meal.  She was having decreased appetite at that time so we did not increase the dose of Trulicity.  We did continue metformin, though. CGM interpretation: -At today's visit, we reviewed her CGM downloads: It appears that 26% of values are in target range (goal >70%), an improvement from last visit when only 16% of the values were in target  range.  Approximately 74% of the blood sugars are higher than 180 (goal <25%), and 0% are lower than 70 (goal <4%).  The calculated average blood sugar is 249.  The projected HbA1c for the next 3 months (GMI) is 9.3%. -Reviewing the CGM trends, it appears that her blood sugars are doing much better overnight, but they increase around 6 AM.  Upon questioning, she is waking up around 5 AM, but only eating 9 AM.  The sugars are not increasing after every meal, with a decrease in the late postprandial period.  Her sugars may have been higher due to the holidays but also steroid injections, so the last months is more difficult to evaluate. - She is taking the lower dose of Tresiba at the recommended  -at last visit I advised her to start with 30 units but then increase to 40 units if needed.  She is still taking 30 units.  Since sugars are higher than target, I recommended to increase the dose to approximately 36 regarding the NovoLog, she is also not using the higher dose recommended in I said that she could use a higher dose for breakfast and also if eating larger meals.  In the meantime, we will also try to decrease the Trulicity dose from 1.5 mg to 3 mg weekly. -Overall, she likes the basal-bolus insulin regimen better than the U500 insulin.  We will continue with this for now. - I suggested to:  Patient Instructions  Please continue: - Metformin ER 1000 mg with breakfast and lunch  Please increase: - Trulicity 3 mg weekly  Please increase: - Tresiba 36-40 units daily - Novolog 20-26 units 15 min before a meal  Please return in 3 months.  - we checked her HbA1c: 10.7% (better) - advised to check sugars at different times of the day - 4x a day, rotating check times - advised for yearly eye exams >> she is UTD - return to clinic in 3 months  2. HL - Reviewed latest lipid panel from 09/2019: LDL above target, as is her total cholesterol level: Lab Results  Component Value Date   CHOL 203 (A)  10/15/2019   HDL 55 10/15/2019   LDLCALC 124 10/15/2019   TRIG 118 10/15/2019  - Continues Zetia-simvastatin 10-40 mg daily without side effects. -We will need to recheck her lipid panel after improvement of her diabetes.  3. obesity class II -Continue GLP-1 receptor agonist which should also help with weight loss.  We will increase the dose of Trulicity at this visit -Since last visit, she gained 3 pounds   Carlus Pavlov, MD PhD [facility]

## 2020-05-29 DIAGNOSIS — U071 COVID-19: Secondary | ICD-10-CM

## 2020-05-29 DIAGNOSIS — J1282 Pneumonia due to coronavirus disease 2019: Secondary | ICD-10-CM

## 2020-05-29 DIAGNOSIS — R0902 Hypoxemia: Secondary | ICD-10-CM

## 2020-05-29 HISTORY — DX: Hypoxemia: R09.02

## 2020-05-29 HISTORY — DX: COVID-19: U07.1

## 2020-07-15 ENCOUNTER — Telehealth: Payer: Self-pay | Admitting: Internal Medicine

## 2020-07-15 DIAGNOSIS — E1142 Type 2 diabetes mellitus with diabetic polyneuropathy: Secondary | ICD-10-CM

## 2020-07-15 NOTE — Telephone Encounter (Signed)
MEDICATION: bd mini pen needles 31G x 58mm  PHARMACY:   [id number] - [place], [place] - [address] Phone:  [phone]  Fax:  [phone]      HAS THE PATIENT CONTACTED THEIR PHARMACY?  no  IS THIS A 90 DAY SUPPLY : no  IS PATIENT OUT OF MEDICATION: no, has enough for couple days  IF NOT; HOW MUCH IS LEFT:   LAST APPOINTMENT DATE: @1 /13/2022  NEXT APPOINTMENT DATE:@4 /18/2022  DO WE HAVE YOUR PERMISSION TO LEAVE A DETAILED MESSAGE?:  OTHER COMMENTS:    **Let patient know to contact pharmacy at the end of the day to make sure medication is ready. **  ** Please notify patient to allow 48-72 hours to process**  **Encourage patient to contact the pharmacy for refills or they can request refills through [facility]**

## 2020-07-20 MED ORDER — BD PEN NEEDLE MINI U/F 31G X 5 MM MISC: 0 refills | Status: DC

## 2020-07-20 NOTE — Telephone Encounter (Signed)
Rx sent to preferred pharmacy.

## 2020-07-31 ENCOUNTER — Ambulatory Visit: Payer: Medicare Other | Admitting: Internal Medicine

## 2020-08-03 ENCOUNTER — Other Ambulatory Visit: Payer: Self-pay

## 2020-08-03 ENCOUNTER — Encounter: Payer: Self-pay | Admitting: Internal Medicine

## 2020-08-03 ENCOUNTER — Ambulatory Visit: Payer: Medicare Other | Admitting: Internal Medicine

## 2020-08-03 VITALS — BP 128/82 | HR 78 | Ht 69.0 in | Wt 262.8 lb

## 2020-08-03 DIAGNOSIS — E1165 Type 2 diabetes mellitus with hyperglycemia: Secondary | ICD-10-CM | POA: Diagnosis not present

## 2020-08-03 DIAGNOSIS — E7800 Pure hypercholesterolemia, unspecified: Secondary | ICD-10-CM | POA: Diagnosis not present

## 2020-08-03 DIAGNOSIS — E669 Obesity, unspecified: Secondary | ICD-10-CM

## 2020-08-03 DIAGNOSIS — E66812 Obesity, class 2: Secondary | ICD-10-CM

## 2020-08-03 DIAGNOSIS — E1142 Type 2 diabetes mellitus with diabetic polyneuropathy: Secondary | ICD-10-CM | POA: Diagnosis not present

## 2020-08-03 LAB — POCT GLYCOSYLATED HEMOGLOBIN (HGB A1C): Hemoglobin A1C: 9.8 % — AB (ref 4.0–5.6)

## 2020-08-03 NOTE — Patient Instructions (Addendum)
Please continue: - Metformin ER 1000 mg with breakfast and lunch - Trulicity 3 mg weekly - Tresiba 36-40 units daily  Increase: - Novolog 24-28 units 15-30 min before a meal  Please return in 3 months.

## 2020-08-03 NOTE — Progress Notes (Signed)
Patient ID: Emily Leon, female   DOB: [birth year], 70 y.o.   MRN: [id number]   This visit occurred during the SARS-CoV-2 public health emergency.  Safety protocols were in place, including screening questions prior to the visit, additional usage of staff PPE, and extensive cleaning of exam room while observing appropriate contact time as indicated for disinfecting solutions.   HPI: Emily Leon is a 70 y.o.-year-old female, initially referred by her PCP, Dr. Celine Mansesai, returning for follow-up for DM2, dx at 70 y/o, insulin-dependent, uncontrolled, with long-term complications (CKD, PN, fatty liver).  She previously saw Dr. Fransico HimNida, last visit with him 02/19/2020.  Last visit 3 months ago.  Interim history: She was admitted 05/2020 for Covid19. Sugars are high around that time: Glu 423 on 06/01/2020. She recovered well, only having occasional fatigue.   No Nausea, increased urination, blurry vision.  Reviewed HbA1c levels: Lab Results  Component Value Date   HGBA1C 10.7 (A) 04/30/2020   HGBA1C 11.1 (A) 02/19/2020   HGBA1C 9.7 (A) 11/20/2019   HGBA1C 9.1 (A) 04/25/2019   HGBA1C 8.8 11/22/2018   HGBA1C 9.7 07/10/2018   HGBA1C 9.6 11/06/2017   HGBA1C 9.6 06/26/2017   HGBA1C 9.2 03/23/2017   HGBA1C 8.7 12/20/2016   At last visit, she was on: - Metformin ER 1000 mg with breakfast and lunch - Trulicity 1.5 mg weekly - U500 insulin 60-60-40 units 3x a day started 2 mo >> now 60 units before B b/c 40s and 50s Prev. On Novolog and [place].  We changed to: - Metformin ER 1000 mg with breakfast and lunch - Trulicity 1.5 mg weekly - Tresiba 30-40 units daily - Novolog 20-24  units 15 min before a meal  Then to: - Metformin ER 1000 mg with breakfast and lunch - Trulicity 3 mg weekly - Tresiba 36-40 units daily  - Novolog 20-22-26 units 15 min before a meal  She checks her sugars more than 4 times a day with her freestyle libre CGM.  However, at today's visit, we could not download her  CGM data for more than 1 day.  I was able to review her receiver data and it appears that sugars have been high after breakfast and after dinner.   Previously:   Previously:   Lowest sugar was 40 (when she was taking U500 insulin later in the day, also) >> 100 >> 40 (at night - ? Why) ; she has hypoglycemia awareness at 2570.  Highest sugar was 300s >> 500 >> 400s in hospital, at home:300s..  Pt's meals are: - Breakfast: egg, toast, fruit - Lunch: salad and fruit - Dinner: baked chicken, salad, fruit - Snacks: -  -+ Mild CKD, last BUN/creatinine:  06/02/2020: Glu 352, BUN/Cr 46/1.71, GFR 35 Lab Results  Component Value Date   BUN 13 10/15/2019   BUN 11 11/22/2018   CREATININE 1.0 10/15/2019   CREATININE 1.2 (A) 11/22/2018  On valsartan 160 mg daily.  -+ HL; last set of lipids: Lab Results  Component Value Date   CHOL 203 (A) 10/15/2019   HDL 55 10/15/2019   LDLCALC 124 10/15/2019   TRIG 118 10/15/2019  On Zetia-simvastatin 10-40 mg daily.  - last eye exam was in 02/2020: No DR reportedly, + macular edema. [place] a retina specialist in [place].   - + numbness and tingling in her feet. On B12 and Benfotiamine.  Pt has FH of DM in sister and unclear in mother  She also has a history of sleeve gastrectomy 2016, OSA-on  CPAP.  ROS: Constitutional: no weight gain/+ weight loss, no fatigue, + subjective hyperthermia, no subjective hypothermia Eyes: no blurry vision, no xerophthalmia ENT: no sore throat, no nodules palpated in neck, no dysphagia, no odynophagia, no hoarseness Cardiovascular: no CP/no SOB/no palpitations/no leg swelling Respiratory: no cough/no SOB/no wheezing Gastrointestinal: no N/no V/no D/no C/no acid reflux Musculoskeletal: no muscle aches/+ joint aches Skin: no rashes, no hair loss Neurological: no tremors/no numbness/no tingling/no dizziness  I reviewed pt's medications, allergies, PMH, social hx, family hx, and changes were documented in the history  of present illness. Otherwise, unchanged from my initial visit note.  Past Medical History:  Diagnosis Date  . Arthritis   . Bronchitis    hx of   . Cataracts, bilateral   . Chronic kidney disease   . Cough   . Diabetes mellitus without complication (HCC)   . Hypertension   . Shortness of breath dyspnea    walking distances or climbing stairs  . Sleep apnea    uses CPAP  . Tingling    toes bilat   . Urinary frequency    Past Surgical History:  Procedure Laterality Date  . ABDOMINAL HYSTERECTOMY    . BREATH TEK H PYLORI N/A 08/26/2014   Procedure: BREATH TEK H PYLORI;  Surgeon: Gaynelle Adu, MD;  Location: [facility];  Service: General;  Laterality: N/A;  . KNEE SURGERY Left 2009  . LAPAROSCOPIC GASTRIC SLEEVE RESECTION N/A 11/11/2014   Procedure: LAPAROSCOPIC GASTRIC SLEEVE RESECTION;  Surgeon: Gaynelle Adu, MD;  Location: [facility];  Service: General;  Laterality: N/A;  . UPPER GI ENDOSCOPY  11/11/2014   Procedure: UPPER GI ENDOSCOPY;  Surgeon: Gaynelle Adu, MD;  Location: [facility];  Service: General;;   Social History   Socioeconomic History  . Marital status: Married    Spouse name: Not on file  . Number of children: 1  . Years of education: Not on file  . Highest education level: Not on file  Occupational History  . Occupation: Retired  Tobacco Use  . Smoking status: Never Smoker  . Smokeless tobacco: Never Used  Vaping Use  . Vaping Use: Never used  Substance and Sexual Activity  . Alcohol use: No  . Drug use: No  . Sexual activity: Not on file  Other Topics Concern  . Not on file  Social History Narrative  . Not on file   Social Determinants of Health   Financial Resource Strain: Not on file  Food Insecurity: Not on file  Transportation Needs: Not on file  Physical Activity: Not on file  Stress: Not on file  Social Connections: Not on file  Intimate Partner Violence: Not on file   Current Outpatient Medications on File Prior to Visit  Medication Sig  Dispense Refill  . amLODipine (NORVASC) 10 MG tablet Take 10 mg by mouth every morning.    Marland Kitchen atenolol (TENORMIN) 100 MG tablet Take 100 mg by mouth every morning.     . Continuous Blood Gluc Sensor (FREESTYLE LIBRE 14 DAY SENSOR) MISC 1 each by Does not apply route every 14 (fourteen) days. 3 each 5  . Dulaglutide (TRULICITY) 3 MG/0.5ML SOPN Inject 3 mg into the skin once a week. 6 mL 3  . ergocalciferol (VITAMIN D2) 50000 UNITS capsule Take 50,000 Units by mouth every Monday.    . ezetimibe-simvastatin (VYTORIN) 10-40 MG tablet Take 1 tablet by mouth at bedtime. 30 tablet 2  . hydrochlorothiazide (HYDRODIURIL) 25 MG tablet Take 12.5 mg by mouth every  morning.     . insulin aspart (NOVOLOG FLEXPEN) 100 UNIT/ML FlexPen Inject 20-30 Units into the skin 3 (three) times daily before meals. 45 mL 3  . insulin degludec (TRESIBA FLEXTOUCH) 200 UNIT/ML FlexTouch Pen Inject 30-40 Units into the skin daily. 9 mL 3  . Insulin Pen Needle (B-D UF III MINI PEN NEEDLES) 31G X 5 MM MISC USE 4 TIMES DAILY AS DIRECTED 360 each 0  . loratadine (CLARITIN) 10 MG tablet Take 10 mg by mouth daily.    . metFORMIN (GLUCOPHAGE-XR) 500 MG 24 hr tablet TAKE 2 TABLETS BY MOUTH EVERY DAY WITH BREAKFAST 60 tablet 2  . ondansetron (ZOFRAN ODT) 4 MG disintegrating tablet Take 1 tablet (4 mg total) by mouth every 8 (eight) hours as needed for nausea or vomiting. 30 tablet 0  . oxyCODONE (ROXICODONE) 5 MG/5ML solution Take 5-10 mLs (5-10 mg total) by mouth every 4 (four) hours as needed for moderate pain or severe pain.  0  . pantoprazole (PROTONIX) 40 MG tablet Take 1 tablet (40 mg total) by mouth daily. (Patient not taking: Reported on 04/30/2020) 30 tablet 0  . valsartan (DIOVAN) 160 MG tablet Take 160 mg by mouth every morning.      No current facility-administered medications on file prior to visit.   Allergies  Allergen Reactions  . Penicillins Itching and Rash  . Sulfa Antibiotics Itching and Rash   Family History   Problem Relation Age of Onset  . Diabetes Mother   . Diabetes Sister    PE: BP 128/82 (BP Location: Right Arm, Patient Position: Sitting, Cuff Size: Normal)   Pulse 78   Ht 5\' 9"  (1.753 m)   Wt 262 lb 12.8 oz (119.2 kg)   SpO2 96%   BMI 38.81 kg/m  Wt Readings from Last 3 Encounters:  08/03/20 262 lb 12.8 oz (119.2 kg)  04/30/20 273 lb 6.4 oz (124 kg)  03/16/20 270 lb 12.8 oz (122.8 kg)   Constitutional: overweight, in NAD Eyes: PERRLA, EOMI, no exophthalmos ENT: moist mucous membranes, no thyromegaly, no cervical lymphadenopathy Cardiovascular: RRR, No MRG Respiratory: CTA B Gastrointestinal: abdomen soft, NT, ND, BS+ Musculoskeletal: no deformities, strength intact in all 4 Skin: moist, warm, no rashes Neurological: no tremor with outstretched hands, DTR normal in all 4  ASSESSMENT: 1. DM2, insulin-dependent, uncontrolled, with long-term complications - mild CKD - fatty liver - PN  2. HL  3.  Obesity class II  PLAN:  1. Patient with longstanding, uncontrolled, insulin resistant type 2 diabetes, on oral antidiabetic regimen with metformin ER, and also basal-bolus insulin regimen and weekly GLP-1 receptor agonist, with improving control.  At last visit, sugars were still high throughout the day especially midday.  The HbA1c was 10.7%, improved from 11.1%, however, per review of her CGM downloads, the projected HbA1c was 9.3%.  Sugars were much better overnight but they were increasing around 6 AM.  She was taking a lower Tresiba dose than recommended so we increased this.  Since sugars were higher after larger meals, we also increased her NovoLog before meals and the Trulicity dose from 1.5 to 3 mg weekly.  Overall, she was doing better on the above regimen compared to the U500 insulin in the past, which she did not feel was a good fit for her. -Reviewing the CGM trends for the last 2 weeks on her device (we could not download more than 1 day of data), sugars appear to be  increasing significantly after breakfast and dinner.  Upon questioning, she is injecting 22 units of NovoLog usually approximately 15 minutes before each meal.  We discussed that she may need to increase the dose and also, if sugars are high before the meal to move the bolus 30 minutes before that meal.  The Tresiba dose appeared to be adequate.  She occasionally has a low blood sugar but I do not feel that this is related to [place] but a late effect of NovoLog.  Taking it earlier before meals should help.  However, if she had persistent low blood sugars at next visit, we may need to back off the [place] dose.  We also discussed about possibly increasing the dose of Trulicity to 4.5 mg weekly since she is tolerating it well, but she has a large stock of 3 mg dose at home so we will continue with this.  We also discussed about adding an SGLT2 inhibitor but she has very frequent urination as it is, while on HCTZ, so for now, we will hold off adding these. - I suggested to:  Patient Instructions  Please continue: - Metformin ER 1000 mg with breakfast and lunch - Trulicity 3 mg weekly - Tresiba 36-40 units daily  Increase: - Novolog 24-28 units 15-30 min before a meal  Please return in 3 months.  - we checked her HbA1c: 9.8% (lower) - advised to check sugars at different times of the day - 4x a day, rotating check times - advised for yearly eye exams >> she is UTD - return to clinic in 3 months  2. HL -Reviewed latest lipid panel from 09/2019: LDL above target, as is her total cholesterol level: Lab Results  Component Value Date   CHOL 203 (A) 10/15/2019   HDL 55 10/15/2019   LDLCALC 124 10/15/2019   TRIG 118 10/15/2019  -Continue Zetia-simvastatin 10-40 mg daily without side effects  3. Obesity class II -We will continue her GLP-1 receptor agonist which should also help with weight loss -She gained 3 pounds before last visit, but lost 11 lbs since then.  She has low appetite and feels  that Trulicity is helping with this.  Carlus Pavlov, MD PhD [facility] Endocrinology

## 2020-09-29 DIAGNOSIS — I70209 Unspecified atherosclerosis of native arteries of extremities, unspecified extremity: Secondary | ICD-10-CM | POA: Insufficient documentation

## 2020-09-29 HISTORY — DX: Unspecified atherosclerosis of native arteries of extremities, unspecified extremity: I70.209

## 2020-10-05 ENCOUNTER — Other Ambulatory Visit: Payer: Self-pay | Admitting: Internal Medicine

## 2020-11-05 ENCOUNTER — Ambulatory Visit: Payer: Medicare Other | Admitting: Internal Medicine

## 2020-12-02 ENCOUNTER — Telehealth: Payer: Self-pay | Admitting: Internal Medicine

## 2020-12-02 NOTE — Telephone Encounter (Signed)
Pt has voiced that we have received 3 faxes regarding diabetic supplies for freestyle. Pt has enough for a couple of days.the patient would like a call back when this is completed

## 2020-12-03 NOTE — Telephone Encounter (Signed)
Pt states that she has two more days of diabetic supplies left of her supply

## 2020-12-03 NOTE — Telephone Encounter (Signed)
Advised pt paperwork completed and faxed today.

## 2020-12-07 ENCOUNTER — Other Ambulatory Visit: Payer: Self-pay | Admitting: Internal Medicine

## 2020-12-07 ENCOUNTER — Telehealth: Payer: Self-pay | Admitting: Internal Medicine

## 2020-12-07 DIAGNOSIS — E1165 Type 2 diabetes mellitus with hyperglycemia: Secondary | ICD-10-CM

## 2020-12-07 DIAGNOSIS — E1142 Type 2 diabetes mellitus with diabetic polyneuropathy: Secondary | ICD-10-CM

## 2020-12-07 MED ORDER — EZETIMIBE-SIMVASTATIN 10-40 MG PO TABS: 1.0000 | ORAL_TABLET | Freq: Every day | ORAL | 3 refills | Status: DC

## 2020-12-07 MED ORDER — FREESTYLE LIBRE 14 DAY SENSOR MISC: 1.0000 | 3 refills | Status: DC

## 2020-12-07 MED ORDER — EZETIMIBE-SIMVASTATIN 10-40 MG PO TABS
1.0000 | ORAL_TABLET | Freq: Every day | ORAL | 2 refills | Status: DC
Start: 2020-12-07 — End: 2020-12-07

## 2020-12-07 NOTE — Telephone Encounter (Signed)
Patient called to request the following new RX with refills (states Dr. Elvera Lennox fills the following RX now)      [facility] 14 Day Reader:   AND   MEDICATION: ezetimibe-simvastatin (VYTORIN) 10-40 MG tablet  PHARMACY:   [id number] [place], [place] - [address] Phone:  [phone]  Fax:  [phone]      HAS THE PATIENT CONTACTED THEIR PHARMACY?  Requires new RX  IS THIS A 90 DAY SUPPLY : No  IS PATIENT OUT OF MEDICATION: Yes  IF NOT; HOW MUCH IS LEFT: 0  LAST APPOINTMENT DATE: @8 /17/2022  NEXT APPOINTMENT DATE:@10 /19/2022  DO WE HAVE YOUR PERMISSION TO LEAVE A DETAILED MESSAGE?:Yes  OTHER COMMENTS:    **Let patient know to contact pharmacy at the end of the day to make sure medication is ready. **  ** Please notify patient to allow 48-72 hours to process**  **Encourage patient to contact the pharmacy for refills or they can request refills through [facility]**

## 2020-12-07 NOTE — Addendum Note (Signed)
Addended by: Kenyon Ana on: 12/07/2020 12:35 PM   Modules accepted: Orders

## 2020-12-07 NOTE — Telephone Encounter (Signed)
Rx sent 

## 2020-12-07 NOTE — Addendum Note (Signed)
Addended by: Kenyon Ana on: 12/07/2020 05:50 PM   Modules accepted: Orders

## 2020-12-07 NOTE — Telephone Encounter (Signed)
T,Yes, OK to send the Vytorin for 90 days with 3 refills. Ty!

## 2020-12-07 NOTE — Telephone Encounter (Signed)
Rx for sent for [facility] to send Rx for  ezetimibe-simvastatin (VYTORIN) 10-40 MG tablet

## 2021-01-06 ENCOUNTER — Other Ambulatory Visit: Payer: Self-pay | Admitting: Internal Medicine

## 2021-01-06 DIAGNOSIS — E1142 Type 2 diabetes mellitus with diabetic polyneuropathy: Secondary | ICD-10-CM

## 2021-02-02 ENCOUNTER — Telehealth: Payer: Self-pay

## 2021-02-02 ENCOUNTER — Encounter: Payer: Self-pay | Admitting: Internal Medicine

## 2021-02-02 NOTE — Telephone Encounter (Signed)
Incoming fax from [employer] requesting recent chart notes for continuation coverage for CGM has been signed and faxed along with recent chart notes. Fax Number: [phone]

## 2021-02-03 ENCOUNTER — Encounter: Payer: Self-pay | Admitting: Internal Medicine

## 2021-02-03 ENCOUNTER — Other Ambulatory Visit: Payer: Self-pay | Admitting: Internal Medicine

## 2021-02-03 ENCOUNTER — Ambulatory Visit (INDEPENDENT_AMBULATORY_CARE_PROVIDER_SITE_OTHER): Payer: Medicare Other | Admitting: Internal Medicine

## 2021-02-03 ENCOUNTER — Other Ambulatory Visit: Payer: Self-pay

## 2021-02-03 VITALS — BP 156/92 | HR 85 | Ht 69.0 in | Wt 264.2 lb

## 2021-02-03 DIAGNOSIS — E669 Obesity, unspecified: Secondary | ICD-10-CM

## 2021-02-03 DIAGNOSIS — E1142 Type 2 diabetes mellitus with diabetic polyneuropathy: Secondary | ICD-10-CM

## 2021-02-03 DIAGNOSIS — E1165 Type 2 diabetes mellitus with hyperglycemia: Secondary | ICD-10-CM

## 2021-02-03 DIAGNOSIS — E7800 Pure hypercholesterolemia, unspecified: Secondary | ICD-10-CM | POA: Diagnosis not present

## 2021-02-03 LAB — POCT GLYCOSYLATED HEMOGLOBIN (HGB A1C): Hemoglobin A1C: 10.1 % — AB (ref 4.0–5.6)

## 2021-02-03 LAB — BASIC METABOLIC PANEL
BUN: 21 mg/dL (ref 6–23)
CO2: 27 mEq/L (ref 19–32)
Calcium: 9.7 mg/dL (ref 8.4–10.5)
Chloride: 104 mEq/L (ref 96–112)
Creatinine, Ser: 1.55 mg/dL — ABNORMAL HIGH (ref 0.40–1.20)
GFR: 34.16 mL/min — ABNORMAL LOW (ref >60.00)
Glucose, Bld: 212 mg/dL — ABNORMAL HIGH (ref 70–99)
Potassium: 3.9 mEq/L (ref 3.5–5.1)
Sodium: 139 mEq/L (ref 135–145)

## 2021-02-03 LAB — LIPID PANEL
Cholesterol: 184 mg/dL (ref 0–200)
HDL: 38.8 mg/dL — ABNORMAL LOW (ref >39.00)
LDL Cholesterol: 116 mg/dL — ABNORMAL HIGH (ref 0–99)
NonHDL: 144.76
Total CHOL/HDL Ratio: 5
Triglycerides: 142 mg/dL (ref 0.0–149.0)
VLDL: 28.4 mg/dL (ref 0.0–40.0)

## 2021-02-03 MED ORDER — FREESTYLE LIBRE 14 DAY SENSOR MISC
1.0000 | 3 refills | Status: DC
Start: 2021-02-03 — End: 2021-05-11

## 2021-02-03 MED ORDER — FREESTYLE LIBRE 14 DAY READER DEVI: 1.0000 | Freq: Once | 1 refills | Status: DC

## 2021-02-03 NOTE — Progress Notes (Signed)
Patient ID: Emily Leon, female   DOB: [birth year], 70 y.o.   MRN: [id number]   This visit occurred during the SARS-CoV-2 public health emergency.  Safety protocols were in place, including screening questions prior to the visit, additional usage of staff PPE, and extensive cleaning of exam room while observing appropriate contact time as indicated for disinfecting solutions.   HPI: Emily Leon is a 70 y.o.-year-old female, initially referred by her PCP, Dr. Celine Mans, returning for follow-up for DM2, dx at 70 y/o, insulin-dependent, uncontrolled, with long-term complications (CKD, PN, fatty liver).  She previously saw Dr. Fransico Him, last visit with him 02/19/2020.  Last visit 6 months ago.  Interim history: She was in the emergency room in [place] with acute sinusitis.  At that time, glucose was high, and 281, she had slightly high osmolality of 301 (275-300), but CO2 was 29.  Chest x-ray was normal. 2 weeks ago >> GFR lower at appt with PCP >> advised to stop Ibuprofen and recheck today. No increased urination, blurry vision, nausea, chest pain.  Reviewed HbA1c levels: Lab Results  Component Value Date   HGBA1C 9.8 (A) 08/03/2020   HGBA1C 10.7 (A) 04/30/2020   HGBA1C 11.1 (A) 02/19/2020   HGBA1C 9.7 (A) 11/20/2019   HGBA1C 9.1 (A) 04/25/2019   HGBA1C 8.8 11/22/2018   HGBA1C 9.7 07/10/2018   HGBA1C 9.6 11/06/2017   HGBA1C 9.6 06/26/2017   HGBA1C 9.2 03/23/2017   Previously on: - Metformin ER 1000 mg with breakfast and lunch - Trulicity 1.5 mg weekly - U500 insulin 60-60-40 units 3x a day started 2 mo >> now 60 units before B b/c 40s and 50s Prev. On Novolog and [place].  We changed to: - Metformin ER 1000 mg with breakfast and lunch - Trulicity 1.5 mg weekly - Tresiba 30-40 units daily - Novolog 20-24  units 15 min before a meal  Then to: - Metformin ER 1000 mg with breakfast and lunch - Trulicity 3 mg weekly - Tresiba 36-40 units daily  - Novolog 20-22-26 >> 24 units 15  min before a meal  She checks her sugars more than 4 times a day with her freestyle libre CGM:   Previously:   Previously:   Previously:   Lowest sugar was 40 (on U500) >> 40 (at night - ? Why) >> 70 ; she has hypoglycemia awareness at 70.  Highest sugar was  500 >> 400s in hospital, at home:300s >> 300s.  Pt's meals are: - Breakfast: egg, toast, fruit >> cereals, bananas - Lunch: salad and fruit - Dinner: baked chicken, salad, fruit - Snacks: -  -+ Mild CKD, last BUN/creatinine:  09/24/2020 (in [place] ED with sinusitis): Glu 281, serum osm 301 (275-300), BUN/creatinine 12/1.4, GFR 41, CO2 29 09/09/2020: 20/1.55, GFR 40, glucose 270 06/02/2020: Glu 352, BUN/Cr 46/1.71, GFR 35 Lab Results  Component Value Date   BUN 13 10/15/2019   BUN 11 11/22/2018   CREATININE 1.0 10/15/2019   CREATININE 1.2 (A) 11/22/2018  On valsartan 160 mg daily.  -+ HL; last set of lipids: Lab Results  Component Value Date   CHOL 203 (A) 10/15/2019   HDL 55 10/15/2019   LDLCALC 124 10/15/2019   TRIG 118 10/15/2019  On Zetia-simvastatin 10-40 mg daily.  - last eye exam was in 02/2020: No DR reportedly, + macular edema. Has a retina specialist in [place].   - + numbness and tingling in her feet. On B12 and Benfotiamine.  Pt has FH of DM in sister and  unclear in mother  She also has a history of sleeve gastrectomy 2016, OSA-on CPAP.  ROS: + See HPI + Joint aches  I reviewed pt's medications, allergies, PMH, social hx, family hx, and changes were documented in the history of present illness. Otherwise, unchanged from my initial visit note.  Past Medical History:  Diagnosis Date   Arthritis    Bronchitis    hx of    Cataracts, bilateral    Chronic kidney disease    Cough    Diabetes mellitus without complication (HCC)    Hypertension    Shortness of breath dyspnea    walking distances or climbing stairs   Sleep apnea    uses CPAP   Tingling    toes bilat    Urinary frequency     Past Surgical History:  Procedure Laterality Date   ABDOMINAL HYSTERECTOMY     BREATH TEK H PYLORI N/A 08/26/2014   Procedure: BREATH TEK H PYLORI;  Surgeon: Gaynelle Adu, MD;  Location: [facility];  Service: General;  Laterality: N/A;   KNEE SURGERY Left 2009   LAPAROSCOPIC GASTRIC SLEEVE RESECTION N/A 11/11/2014   Procedure: LAPAROSCOPIC GASTRIC SLEEVE RESECTION;  Surgeon: Gaynelle Adu, MD;  Location: [facility];  Service: General;  Laterality: N/A;   UPPER GI ENDOSCOPY  11/11/2014   Procedure: UPPER GI ENDOSCOPY;  Surgeon: Gaynelle Adu, MD;  Location: [facility];  Service: General;;   Social History   Socioeconomic History   Marital status: Married    Spouse name: Not on file   Number of children: 1   Years of education: Not on file   Highest education level: Not on file  Occupational History   Occupation: Retired  Tobacco Use   Smoking status: Never   Smokeless tobacco: Never  Vaping Use   Vaping Use: Never used  Substance and Sexual Activity   Alcohol use: No   Drug use: No   Sexual activity: Not on file  Other Topics Concern   Not on file  Social History Narrative   Not on file   Social Determinants of Health   Financial Resource Strain: Not on file  Food Insecurity: Not on file  Transportation Needs: Not on file  Physical Activity: Not on file  Stress: Not on file  Social Connections: Not on file  Intimate Partner Violence: Not on file   Current Outpatient Medications on File Prior to Visit  Medication Sig Dispense Refill   amLODipine (NORVASC) 10 MG tablet Take 10 mg by mouth every morning.     atenolol (TENORMIN) 100 MG tablet Take 100 mg by mouth every morning.      B-D UF III MINI PEN NEEDLES 31G X 5 MM MISC USE 4 TIMES DAILY AS DIRECTED 400 each 1   Continuous Blood Gluc Sensor (FREESTYLE LIBRE 14 DAY SENSOR) MISC 1 each by Does not apply route every 14 (fourteen) days. 6 each 3   Dulaglutide (TRULICITY) 3 MG/0.5ML SOPN Inject 3 mg into the skin once a week. 6  mL 3   ergocalciferol (VITAMIN D2) 50000 UNITS capsule Take 50,000 Units by mouth every Monday.     ezetimibe-simvastatin (VYTORIN) 10-40 MG tablet Take 1 tablet by mouth at bedtime. 90 tablet 3   hydrochlorothiazide (HYDRODIURIL) 25 MG tablet Take 12.5 mg by mouth every morning.      insulin degludec (TRESIBA FLEXTOUCH) 200 UNIT/ML FlexTouch Pen Inject 30-40 Units into the skin daily. 9 mL 3   loratadine (CLARITIN) 10 MG tablet Take 10  mg by mouth daily.     metFORMIN (GLUCOPHAGE-XR) 500 MG 24 hr tablet TAKE 2 TABLETS BY MOUTH EVERY DAY WITH BREAKFAST 60 tablet 2   NOVOLOG FLEXPEN 100 UNIT/ML FlexPen INJECT 20-30 UNITS INTO THE SKIN 3 (THREE) TIMES DAILY BEFORE MEALS. 45 mL 3   ondansetron (ZOFRAN ODT) 4 MG disintegrating tablet Take 1 tablet (4 mg total) by mouth every 8 (eight) hours as needed for nausea or vomiting. 30 tablet 0   oxyCODONE (ROXICODONE) 5 MG/5ML solution Take 5-10 mLs (5-10 mg total) by mouth every 4 (four) hours as needed for moderate pain or severe pain.  0   pantoprazole (PROTONIX) 40 MG tablet Take 1 tablet (40 mg total) by mouth daily. 30 tablet 0   valsartan (DIOVAN) 160 MG tablet Take 160 mg by mouth every morning.      No current facility-administered medications on file prior to visit.   Allergies  Allergen Reactions   Penicillins Itching and Rash   Sulfa Antibiotics Itching and Rash   Family History  Problem Relation Age of Onset   Diabetes Mother    Diabetes Sister    PE: BP (!) 156/92 (BP Location: Left Arm, Patient Position: Sitting, Cuff Size: Normal)   Pulse 85   Ht 5\' 9"  (1.753 m)   Wt 264 lb 3.2 oz (119.8 kg)   SpO2 98%   BMI 39.02 kg/m  Wt Readings from Last 3 Encounters:  02/03/21 264 lb 3.2 oz (119.8 kg)  08/03/20 262 lb 12.8 oz (119.2 kg)  04/30/20 273 lb 6.4 oz (124 kg)   Constitutional: overweight, in NAD Eyes: PERRLA, EOMI, no exophthalmos ENT: moist mucous membranes, no thyromegaly, no cervical lymphadenopathy Cardiovascular: RRR,  No MRG Respiratory: CTA B Gastrointestinal: abdomen soft, NT, ND, BS+ Musculoskeletal: no deformities, strength intact in all 4 Skin: moist, warm, no rashes Neurological: no tremor with outstretched hands, DTR normal in all 4  ASSESSMENT: 1. DM2, insulin-dependent, uncontrolled, with long-term complications - mild CKD - fatty liver - PN  2. HL  3.  Obesity class II  PLAN:  1. Patient with longstanding, uncontrolled, insulin resistance type 2 diabetes, on oral antidiabetic regimen with metformin ER and also basal-bolus insulin regimen and weekly GLP-1 receptor agonist, with improving, but still poor control.  She was on U500 insulin in the past, which she did not feel was a good fit for her as sugars were very fluctuating.  We changed to 05/02/20 and NovoLog as sugars started to improve. -At last visit, we continued her on Tresiba and increase her NovoLog dose.  At that time, sugars were significantly higher after breakfast and dinner.  We did discuss that last visit about possibly starting an SGLT2 inhibitor, but she had very frequent urination while on HCTZ so we held off adding this.  HbA1c was lower, at 9.8%. -However, reviewing her labs since last visit, glucose was elevated on regular labs: 270, 281 CGM interpretation: -At today's visit, we reviewed her CGM downloads: It appears that 32% of values are in target range (goal >70%), while 68% are higher than 180 (goal <25%), and 0% are lower than 70 (goal <4%).  The calculated average blood sugar is 224.  The projected HbA1c for the next 3 months (GMI) is 8.7%. -Reviewing the CGM trends, it appears that her sugars are lower overnight, but still with many values above target, but they increase significantly after breakfast, to the 230-345 range.  Afterwards, they decrease and they remain controlled after lunch while increasing  slightly after dinner.  However, the largest peak appears to be after breakfast.  Upon questioning, she is eating  cereals for breakfast and I strongly advised her to stop this and gave her examples of healthier breakfast with a lower glycemic index.  I did advise her that if the sugars remain elevated after she changes breakfast, she definitely needs a higher dose of NovoLog before this meal.  I also encouraged her to use the 28 units of NovoLog more frequently.  She is now using mostly the 24 units with all meals.  She is injecting sometimes before a meal but mostly at the start of the meal, and I advised her that this is too late.  She needs to inject at least 15 minutes before each meal.  Ultimately, upon questioning, she is still drinking sweet tea and regular soda [facility]).  I advised her that for [place] to be able to control her diabetes she absolutely needs to be off any sweet drinks. -At today's visit, she would want me to check her kidney function as this returned low at last visit with PCP.  I will need to fax the results to PCP. - I suggested to:  Patient Instructions  Please continue: - Metformin ER 1000 mg with breakfast and lunch - Trulicity 3 mg weekly - Tresiba 36-40 units daily - Novolog 24-28 units 15 min before a meal  STOP SWEET TEA and SODA.  STOP cereals. You can replace these with: - oatmeal + unsalted nuts, chia seeds, grounds, flax seeds + berries: bluberries, strawberries - whole grain avocado toast - while grain bread with hummus - crispbread ([place]) with a spread as above  Please return in 3 months.  - we checked her HbA1c: 10.1% (higher) - advised to check sugars at different times of the day - 4x a day, rotating check times - advised for yearly eye exams >> she is UTD - return to clinic in 3 months  2. HL -Reviewed latest lipid panel from 09/2019: LDL above target, rest of fractions at goal: Lab Results  Component Value Date   CHOL 203 (A) 10/15/2019   HDL 55 10/15/2019   LDLCALC 124 10/15/2019   TRIG 118 10/15/2019  -She continues on simvastatin 40-Zetia 10 mg  daily without side effects -She is due for another lipid panel -we will check this again today, fasting  3. Obesity class II -We will continue her GLP-1 receptor agonist (Trulicity, 3 mg weekly), as this should also help with weight loss -She lost 11 pounds before last visit and gained 2 pounds since then  Component     Latest Ref Rng & Units 02/03/2021  Sodium     135 - 145 mEq/L 139  Potassium     3.5 - 5.1 mEq/L 3.9  Chloride     96 - 112 mEq/L 104  CO2     19 - 32 mEq/L 27  Glucose     70 - 99 mg/dL 161 (H)  BUN     6 - 23 mg/dL 21  Creatinine     0.96 - 1.20 mg/dL 0.45 (H)  GFR     >40.98 mL/min 34.16 (L)  Calcium     8.4 - 10.5 mg/dL 9.7  Cholesterol     0 - 200 mg/dL 119  Triglycerides     0.0 - 149.0 mg/dL 147.8  HDL Cholesterol     >39.00 mg/dL 29.56 (L)  VLDL     0.0 - 40.0 mg/dL 28.4  LDL (calc)     0 - 99 mg/dL 350 (H)  Total CHOL/HDL Ratio      5  NonHDL      144.76   Urine ACR pending.  LDL is above target.  I will check with her if she is taking cholesterol medication consistently.  I also suggested changes in her diet. Glucose is high, and GFR is lower than before, is 34.  I will try to forward these labs to her PCP.  Carlus Pavlov, MD PhD [facility]

## 2021-02-03 NOTE — Patient Instructions (Addendum)
Please continue: - Metformin ER 1000 mg with breakfast and lunch - Trulicity 3 mg weekly - Tresiba 36-40 units daily - Novolog 24-28 units 15 min before a meal  STOP SWEET TEA and SODA.  STOP cereals. You can replace these with: - oatmeal + unsalted nuts, chia seeds, grounds, flax seeds + berries: bluberries, strawberries - whole grain avocado toast - while grain bread with hummus - crispbread ([place]) with a spread as above  Please return in 3 months.

## 2021-02-04 ENCOUNTER — Other Ambulatory Visit (HOSPITAL_COMMUNITY): Payer: Self-pay

## 2021-03-02 ENCOUNTER — Other Ambulatory Visit: Payer: Self-pay | Admitting: "Endocrinology

## 2021-03-08 ENCOUNTER — Other Ambulatory Visit: Payer: Self-pay | Admitting: "Endocrinology

## 2021-03-26 ENCOUNTER — Other Ambulatory Visit: Payer: Self-pay | Admitting: "Endocrinology

## 2021-04-27 ENCOUNTER — Telehealth: Payer: Self-pay | Admitting: Internal Medicine

## 2021-04-27 DIAGNOSIS — E1142 Type 2 diabetes mellitus with diabetic polyneuropathy: Secondary | ICD-10-CM

## 2021-05-11 ENCOUNTER — Ambulatory Visit: Payer: Medicare Other | Admitting: Internal Medicine

## 2021-05-11 ENCOUNTER — Other Ambulatory Visit: Payer: Self-pay

## 2021-05-11 ENCOUNTER — Encounter: Payer: Self-pay | Admitting: Internal Medicine

## 2021-05-11 VITALS — BP 130/82 | HR 81 | Ht 69.0 in | Wt 265.6 lb

## 2021-05-11 DIAGNOSIS — E1165 Type 2 diabetes mellitus with hyperglycemia: Secondary | ICD-10-CM | POA: Diagnosis not present

## 2021-05-11 DIAGNOSIS — E1142 Type 2 diabetes mellitus with diabetic polyneuropathy: Secondary | ICD-10-CM | POA: Diagnosis not present

## 2021-05-11 LAB — POCT GLYCOSYLATED HEMOGLOBIN (HGB A1C): Hemoglobin A1C: 10.8 % — AB (ref 4.0–5.6)

## 2021-05-11 MED ORDER — FREESTYLE LIBRE 2 SENSOR MISC: 3 refills | Status: DC

## 2021-05-11 NOTE — Progress Notes (Signed)
Patient ID: Emily Leon, female   DOB: [birth year], 71 y.o.   MRN: [id number]   This visit occurred during the SARS-CoV-2 public health emergency.  Safety protocols were in place, including screening questions prior to the visit, additional usage of staff PPE, and extensive cleaning of exam room while observing appropriate contact time as indicated for disinfecting solutions.   HPI: Emily Leon is a 71 y.o.-year-old female, initially referred by her PCP, Dr. Celine Mans, returning for follow-up for DM2, dx at 71 y/o, insulin-dependent, uncontrolled, with long-term complications (CKD, PN, fatty liver).  She previously saw Dr. Fransico Him, last visit with him 02/19/2020.  Last visit 3 months ago.  Interim history: No increased urination, blurry vision, nausea, chest pain. She has diarrhea if she eats early in am.  Reviewed HbA1c levels: Lab Results  Component Value Date   HGBA1C 10.1 (A) 02/03/2021   HGBA1C 9.8 (A) 08/03/2020   HGBA1C 10.7 (A) 04/30/2020   HGBA1C 11.1 (A) 02/19/2020   HGBA1C 9.7 (A) 11/20/2019   HGBA1C 9.1 (A) 04/25/2019   HGBA1C 8.8 11/22/2018   HGBA1C 9.7 07/10/2018   HGBA1C 9.6 11/06/2017   HGBA1C 9.6 06/26/2017   Previously on: - Metformin ER 1000 mg with breakfast and lunch - Trulicity 1.5 mg weekly - U500 insulin 60-60-40 units 3x a day started 2 mo >> now 60 units before B b/c 40s and 50s Prev. On Novolog and [place].  We changed to: - Metformin ER 1000 mg with breakfast and lunch - Trulicity 1.5 mg weekly - Tresiba 30-40 units daily - Novolog 20-24  units 15 min before a meal  Then to: - Metformin ER 1000 mg with breakfast and lunch - Trulicity 3 mg weekly - Tresiba 36 units daily  - Novolog 20-22-26 >> 24-28 units 15 min before a meal  She checks her sugars more than 4 times a day with her freestyle libre CGM:   Previously:   Previously:   Previously:   Lowest sugar was 40 (on U500) >> 40 (at night - ? Why) >> 70 >> 60; she has hypoglycemia  awareness at 72.  Highest sugar was  500 >> 400s in hospital, at home:300s >> 300s >> 300s.  Pt's meals are: - Breakfast: egg, toast, fruit >> cereals, bananas - Lunch: salad and fruit - Dinner: baked chicken, salad, fruit - Snacks: -  -+ Mild CKD, last BUN/creatinine:   Lab Results  Component Value Date   BUN 21 02/03/2021   BUN 13 10/15/2019   CREATININE 1.55 (H) 02/03/2021   CREATININE 1.0 10/15/2019  On valsartan 160 mg daily.  -+ HL; last set of lipids: Lab Results  Component Value Date   CHOL 184 02/03/2021   HDL 38.80 (L) 02/03/2021   LDLCALC 116 (H) 02/03/2021   TRIG 142.0 02/03/2021   CHOLHDL 5 02/03/2021  09/24/2020 (in [place] ED with sinusitis): Glu 281, serum osm 301 (275-300), BUN/creatinine 12/1.4, GFR 41, CO2 29 09/09/2020: 20/1.55, GFR 40, glucose 270 06/02/2020: Glu 352, BUN/Cr 46/1.71, GFR 35 On Zetia-simvastatin 10-40 mg daily.  - last eye exam was in 02/2020: No DR reportedly, + macular edema. Has a retina specialist in [place].   - + numbness and tingling in her feet. On B12 and Benfotiamine.  She sees podiatry-last visit 03/18/2021.  Pt has FH of DM in sister and unclear in mother  She also has a history of sleeve gastrectomy 2016, OSA-on CPAP.  ROS: + See HPI + Joint aches  I reviewed pt's medications, allergies,  PMH, social hx, family hx, and changes were documented in the history of present illness. Otherwise, unchanged from my initial visit note.  Past Medical History:  Diagnosis Date   Arthritis    Bronchitis    hx of    Cataracts, bilateral    Chronic kidney disease    Cough    Diabetes mellitus without complication (HCC)    Hypertension    Shortness of breath dyspnea    walking distances or climbing stairs   Sleep apnea    uses CPAP   Tingling    toes bilat    Urinary frequency    Past Surgical History:  Procedure Laterality Date   ABDOMINAL HYSTERECTOMY     BREATH TEK H PYLORI N/A 08/26/2014   Procedure: BREATH TEK H  PYLORI;  Surgeon: Gaynelle AduEric Wilson, MD;  [facility];  Service: General;  Laterality: N/A;   KNEE SURGERY Left 2009   LAPAROSCOPIC GASTRIC SLEEVE RESECTION N/A 11/11/2014   Procedure: LAPAROSCOPIC GASTRIC SLEEVE RESECTION;  Surgeon: Gaynelle AduEric Wilson, MD;  Location: [facility];  Service: General;  Laterality: N/A;   UPPER GI ENDOSCOPY  11/11/2014   Procedure: UPPER GI ENDOSCOPY;  Surgeon: Gaynelle AduEric Wilson, MD;  Location: [facility];  Service: General;;   Social History   Socioeconomic History   Marital status: Married    Spouse name: Not on file   Number of children: 1   Years of education: Not on file   Highest education level: Not on file  Occupational History   Occupation: Retired  Tobacco Use   Smoking status: Never   Smokeless tobacco: Never  Vaping Use   Vaping Use: Never used  Substance and Sexual Activity   Alcohol use: No   Drug use: No   Sexual activity: Not on file  Other Topics Concern   Not on file  Social History Narrative   Not on file   Social Determinants of Health   Financial Resource Strain: Not on file  Food Insecurity: Not on file  Transportation Needs: Not on file  Physical Activity: Not on file  Stress: Not on file  Social Connections: Not on file  Intimate Partner Violence: Not on file   Current Outpatient Medications on File Prior to Visit  Medication Sig Dispense Refill   amLODipine (NORVASC) 10 MG tablet Take 10 mg by mouth every morning.     atenolol (TENORMIN) 100 MG tablet Take 100 mg by mouth every morning.      B-D UF III MINI PEN NEEDLES 31G X 5 MM MISC USE 4 TIMES DAILY AS DIRECTED 400 each 1   Continuous Blood Gluc Receiver (FREESTYLE LIBRE 2 READER) DEVI USE 1 EACH BY DOES NOT APPLY ROUTE ONCE FOR 1 DOSE. 1 each 1   Continuous Blood Gluc Sensor (FREESTYLE LIBRE 14 DAY SENSOR) MISC 1 each by Does not apply route every 14 (fourteen) days. 6 each 3   Dulaglutide (TRULICITY) 3 MG/0.5ML SOPN Inject 3 mg into the skin once a week. 6 mL 3    ergocalciferol (VITAMIN D2) 50000 UNITS capsule Take 50,000 Units by mouth every Monday.     ezetimibe-simvastatin (VYTORIN) 10-40 MG tablet Take 1 tablet by mouth at bedtime. 90 tablet 3   hydrochlorothiazide (HYDRODIURIL) 25 MG tablet Take 12.5 mg by mouth every morning.      insulin degludec (TRESIBA FLEXTOUCH) 200 UNIT/ML FlexTouch Pen INJECT 30-40 UNITS INTO THE SKIN DAILY. 9 mL 1   loratadine (CLARITIN) 10 MG tablet Take 10 mg by mouth  daily.     metFORMIN (GLUCOPHAGE-XR) 500 MG 24 hr tablet TAKE 2 TABLETS BY MOUTH EVERY DAY WITH BREAKFAST 60 tablet 2   NOVOLOG FLEXPEN 100 UNIT/ML FlexPen INJECT 20-30 UNITS INTO THE SKIN 3 (THREE) TIMES DAILY BEFORE MEALS. 45 mL 3   ondansetron (ZOFRAN ODT) 4 MG disintegrating tablet Take 1 tablet (4 mg total) by mouth every 8 (eight) hours as needed for nausea or vomiting. 30 tablet 0   oxyCODONE (ROXICODONE) 5 MG/5ML solution Take 5-10 mLs (5-10 mg total) by mouth every 4 (four) hours as needed for moderate pain or severe pain.  0   pantoprazole (PROTONIX) 40 MG tablet Take 1 tablet (40 mg total) by mouth daily. 30 tablet 0   valsartan (DIOVAN) 160 MG tablet Take 160 mg by mouth every morning.      No current facility-administered medications on file prior to visit.   Allergies  Allergen Reactions   Penicillins Itching and Rash   Sulfa Antibiotics Itching and Rash   Family History  Problem Relation Age of Onset   Diabetes Mother    Diabetes Sister    PE: BP 130/82 (BP Location: Right Arm, Patient Position: Sitting, Cuff Size: Normal)    Pulse 81    Ht 5\' 9"  (1.753 m)    Wt 265 lb 9.6 oz (120.5 kg)    SpO2 96%    BMI 39.22 kg/m  Wt Readings from Last 3 Encounters:  05/11/21 265 lb 9.6 oz (120.5 kg)  02/03/21 264 lb 3.2 oz (119.8 kg)  08/03/20 262 lb 12.8 oz (119.2 kg)   Constitutional: overweight, in NAD Eyes: PERRLA, EOMI, no exophthalmos ENT: moist mucous membranes, no thyromegaly, no cervical lymphadenopathy Cardiovascular: RRR, No  MRG Respiratory: CTA B Musculoskeletal: no deformities, strength intact in all 4 Skin: moist, warm, no rashes Neurological: no tremor with outstretched hands, DTR normal in all 4  ASSESSMENT: 1. DM2, insulin-dependent, uncontrolled, with long-term complications - mild CKD - fatty liver - PN  2. HL  3.  Obesity class II  PLAN:  1. Patient with longstanding, uncontrolled, insulin resistant type 2 diabetes, on oral antidiabetic regimen with metformin ER and also basal-bolus insulin regimen and weekly GLP-1 receptor agonist, with still poor control.  She was on U500 insulin in the past, which she did not feel was a good fit for her as sugars were very fluctuating.  We changed to 08/05/20 and NovoLog and sugars started to improve, however, at last visit, they were higher and HbA1c increased again to 10.1%.  At that time, reviewing her CGM trends, it appears that her sugars were lower overnight but still with many values above target, and they were increasing significantly after breakfast, in the upper 200s and even 300s range.  Afterwards, they were decreasing and remained controlled after lunch while increasing slightly after dinner.  The largest hyperglycemic peak appeared to be after breakfast.  She was eating cereals for breakfast and I strongly advised her to change to a healthier breakfast with a lower glycemic index.  We discussed about examples of better breakfast meals.  She was mostly injecting insulin at the start of the meal and we discussed about moving the insulin dose 15 minutes before the meal.  I strongly advised her to stop sodas, since she was still drinking [facility].  At this visit, she tells me that she still drinks sodas, but starting to cut down. CGM interpretation: -At today's visit, we reviewed her CGM downloads: It appears that 33% of  values are in target range (goal >70%), while 64% are higher than 180 (goal <25%), and 3% are lower than 70 (goal <4%).  The calculated  average blood sugar is 206.  The projected HbA1c for the next 3 months (GMI) is 8.2%. -Reviewing the CGM trends, it appears that her sugars are still very fluctuating, lower overnight, with varying between 50s and 250s throughout the night, and much higher during the day, increasing after every meal and dropping abruptly between meals.  We discussed that this is a pattern reminiscent of taking her NovoLog insulin too late.  She admits of taking it from 15 min before to 15 min after a meal.  We discussed that she absolutely needs to take it before the meal and I advised her to try to leave 15 to 30 minutes after she takes her NovoLog before she starts her meal.  In the meantime, I advised her to increase Tresiba dose and I again underlined the need to stop sweet tea and soda.  She also tried to change her breakfast since last visit which did not like oatmeal.  At last visit I gave her several healthier options for breakfast -discussed about trying the rest of the options. - I suggested to:  Patient Instructions  Please continue: - Metformin ER 1000 mg with breakfast and lunch - Trulicity 3 mg weekly - Novolog 24-28 units 15-30 min before a meal  Try to increase: - Tresiba 42 units daily  STOP SWEET TEA and SODA.  Please return in 3 months.  - we checked her HbA1c: 10.8% (still very high, possibly due to the holidays) - advised to check sugars at different times of the day - 4x a day, rotating check times - advised for yearly eye exams >> she is not UTD - return to clinic in 3 months  2. HL -Reviewed latest lipid panel from 01/2021: LDL above target, the rest of the fractions at goal: Lab Results  Component Value Date   CHOL 184 02/03/2021   HDL 38.80 (L) 02/03/2021   LDLCALC 116 (H) 02/03/2021   TRIG 142.0 02/03/2021   CHOLHDL 5 02/03/2021  -She continues on simvastatin 40, Zetia 10 mg daily without side effects  3. Obesity class II -We will continue her GLP-1 receptor agonist, which  should also help with weight loss -She gained 2 pounds before last visit, previously lost 11 pounds  Carlus Pavlovristina Iolanda Folson, MD PhD [facility]

## 2021-05-11 NOTE — Patient Instructions (Signed)
Please continue: - Metformin ER 1000 mg with breakfast and lunch - Trulicity 3 mg weekly - Novolog 24-28 units 15-30 min before a meal  Try to increase: - Tresiba 42 units daily  STOP SWEET TEA and SODA.  Please return in 3 months.

## 2021-05-18 MED ORDER — METFORMIN HCL ER 500 MG PO TB24: ORAL_TABLET | ORAL | 3 refills | Status: DC

## 2021-05-18 NOTE — Telephone Encounter (Signed)
Pt is calling in stating that she is out of Rx's ezetimibe-simvastatin (VYTORIN) 10-40 MG and metformin (GLUCOPHAGE-XR) 500 MG.  Pharm:  [facility] [address] in [place], [place]

## 2021-05-18 NOTE — Telephone Encounter (Signed)
Metformin rx sent to preferred pharmacy. Confirmed with pharmacy refills available for ezetimibe-simvastatin (VYTORIN) on file.

## 2021-08-10 ENCOUNTER — Ambulatory Visit: Payer: Medicare Other | Admitting: Internal Medicine

## 2021-08-12 ENCOUNTER — Encounter: Payer: Self-pay | Admitting: Internal Medicine

## 2021-08-12 ENCOUNTER — Ambulatory Visit: Payer: Medicare Other | Admitting: Internal Medicine

## 2021-08-12 VITALS — BP 138/78 | HR 78 | Ht 69.0 in | Wt 274.8 lb

## 2021-08-12 DIAGNOSIS — E1142 Type 2 diabetes mellitus with diabetic polyneuropathy: Secondary | ICD-10-CM

## 2021-08-12 DIAGNOSIS — E1165 Type 2 diabetes mellitus with hyperglycemia: Secondary | ICD-10-CM

## 2021-08-12 DIAGNOSIS — E669 Obesity, unspecified: Secondary | ICD-10-CM | POA: Diagnosis not present

## 2021-08-12 DIAGNOSIS — E7800 Pure hypercholesterolemia, unspecified: Secondary | ICD-10-CM | POA: Diagnosis not present

## 2021-08-12 LAB — POCT GLYCOSYLATED HEMOGLOBIN (HGB A1C): Hemoglobin A1C: 9.5 % — AB (ref 4.0–5.6)

## 2021-08-12 MED ORDER — FREESTYLE LIBRE 2 SENSOR MISC: 3 refills | Status: DC

## 2021-08-12 NOTE — Progress Notes (Signed)
Patient ID: Emily Leon, female   DOB: [birth year], 71 y.o.   MRN: [id number]  ? ?This visit occurred during the SARS-CoV-2 public health emergency.  Safety protocols were in place, including screening questions prior to the visit, additional usage of staff PPE, and extensive cleaning of exam room while observing appropriate contact time as indicated for disinfecting solutions.  ? ?HPI: ?Emily MillardBarbaretta S Brunette is a 71 y.o.-year-old female, initially referred by her PCP, Dr. Celine Mansesai, returning for follow-up for DM2, dx at 71 y/o, insulin-dependent, uncontrolled, with long-term complications (CKD, PN, fatty liver).  She previously saw Dr. Fransico HimNida, last visit with him 02/19/2020.  Last visit 3 months ago. ? ?Interim history: ?No increased urination, blurry vision, nausea, chest pain. ?She has diarrhea if she eats early in am. ?Since last visit, she stopped sweet drinks.  Sugars improved and she feels better. ? ?Reviewed HbA1c levels: ?Lab Results  ?Component Value Date  ? HGBA1C 10.8 (A) 05/11/2021  ? HGBA1C 10.1 (A) 02/03/2021  ? HGBA1C 9.8 (A) 08/03/2020  ? HGBA1C 10.7 (A) 04/30/2020  ? HGBA1C 11.1 (A) 02/19/2020  ? HGBA1C 9.7 (A) 11/20/2019  ? HGBA1C 9.1 (A) 04/25/2019  ? HGBA1C 8.8 11/22/2018  ? HGBA1C 9.7 07/10/2018  ? HGBA1C 9.6 11/06/2017  ? ?Previously on: ?- Metformin ER 1000 mg with breakfast and lunch ?- Trulicity 1.5 mg weekly ?- U500 insulin 60-60-40 units 3x a day started 2 mo >> now 60 units before B b/c 40s and 50s ?Prev. On Novolog and [place]. ? ?We changed to: ?- Metformin ER 1000 mg with breakfast and lunch ?- Trulicity 1.5 mg weekly ?- Tresiba 30-40 units daily ?- Novolog 20-24  units 15 min before a meal ? ?Then to: ?- Metformin ER 1000 mg with breakfast and lunch ?- Trulicity 3 mg weekly ?- Tresiba 36 >> 42 units daily ? - Novolog 20-22-26 >> 24-28 units 15-30 min before a meal ? ?She checks her sugars more than 4 times a day with her freestyle libre CGM: ? ? ?Previously: ? ? ?Previously: ? ? ?Lowest  sugar was 40 (at night - ? Why) >> 70 >> 60 >> 50s; she has hypoglycemia awareness at 72.  ?Highest sugar was  500 >> .Marland Kitchen.Marland Kitchen. 300s >> 300s. ? ?Pt's meals are: ?- Breakfast: egg, toast, fruit >> cereals, bananas ?- Lunch: salad and fruit ?- Dinner: baked chicken, salad, fruit ?- Snacks: - ? ?-+ Mild CKD, last BUN/creatinine:  ? ?Lab Results  ?Component Value Date  ? BUN 21 02/03/2021  ? BUN 13 10/15/2019  ? CREATININE 1.55 (H) 02/03/2021  ? CREATININE 1.0 10/15/2019  ?On valsartan 160 mg daily. ? ?-+ HL; last set of lipids: ?Lab Results  ?Component Value Date  ? CHOL 184 02/03/2021  ? HDL 38.80 (L) 02/03/2021  ? LDLCALC 116 (H) 02/03/2021  ? TRIG 142.0 02/03/2021  ? CHOLHDL 5 02/03/2021  ?09/24/2020 (in [place] ED with sinusitis): Glu 281, serum osm 301 (275-300), BUN/creatinine 12/1.4, GFR 41, CO2 29 ?09/09/2020: 20/1.55, GFR 40, glucose 270 ?06/02/2020: Glu 352, BUN/Cr 46/1.71, GFR 35 ?On Zetia-simvastatin 10-40 mg daily. ? ?- last eye exam was in Fall 2022: No DR reportedly, + macular edema. [place] retina specialist in [place].  ? ?- + numbness and tingling in her feet. On B12 and Benfotiamine.  She sees podiatry-last visit 03/18/2021. ? ?Pt has FH of DM in sister and unclear in mother ? ?She also has a history of sleeve gastrectomy 2016, OSA-on CPAP. ? ?ROS: ?+ See HPI ?+  Joint aches ? ?I reviewed pt's medications, allergies, PMH, social hx, family hx, and changes were documented in the history of present illness. Otherwise, unchanged from my initial visit note. ? ?Past Medical History:  ?Diagnosis Date  ? Arthritis   ? Bronchitis   ? hx of   ? Cataracts, bilateral   ? Chronic kidney disease   ? Cough   ? Diabetes mellitus without complication (HCC)   ? Hypertension   ? Shortness of breath dyspnea   ? walking distances or climbing stairs  ? Sleep apnea   ? uses CPAP  ? Tingling   ? toes bilat   ? Urinary frequency   ? ?Past Surgical History:  ?Procedure Laterality Date  ? ABDOMINAL HYSTERECTOMY    ? BREATH TEK H PYLORI  N/A 08/26/2014  ? Procedure: BREATH TEK H PYLORI;  Surgeon: Gaynelle Adu, MD;  Location: [facility];  Service: General;  Laterality: N/A;  ? KNEE SURGERY Left 2009  ? LAPAROSCOPIC GASTRIC SLEEVE RESECTION N/A 11/11/2014  ? Procedure: LAPAROSCOPIC GASTRIC SLEEVE RESECTION;  Surgeon: Gaynelle Adu, MD;  Location: [facility];  Service: General;  Laterality: N/A;  ? UPPER GI ENDOSCOPY  11/11/2014  ? Procedure: UPPER GI ENDOSCOPY;  Surgeon: Gaynelle Adu, MD;  Location: [facility];  Service: General;;  ? ?Social History  ? ?Socioeconomic History  ? Marital status: Married  ?  Spouse name: Not on file  ? Number of children: 1  ? Years of education: Not on file  ? Highest education level: Not on file  ?Occupational History  ? Occupation: Retired  ?Tobacco Use  ? Smoking status: Never  ? Smokeless tobacco: Never  ?Vaping Use  ? Vaping Use: Never used  ?Substance and Sexual Activity  ? Alcohol use: No  ? Drug use: No  ? Sexual activity: Not on file  ?Other Topics Concern  ? Not on file  ?Social History Narrative  ? Not on file  ? ?Social Determinants of Health  ? ?Financial Resource Strain: Not on file  ?Food Insecurity: Not on file  ?Transportation Needs: Not on file  ?Physical Activity: Not on file  ?Stress: Not on file  ?Social Connections: Not on file  ?Intimate Partner Violence: Not on file  ? ?Current Outpatient Medications on File Prior to Visit  ?Medication Sig Dispense Refill  ? amLODipine (NORVASC) 10 MG tablet Take 10 mg by mouth every morning.    ? atenolol (TENORMIN) 100 MG tablet Take 100 mg by mouth every morning.     ? B-D UF III MINI PEN NEEDLES 31G X 5 MM MISC USE 4 TIMES DAILY AS DIRECTED 400 each 1  ? Continuous Blood Gluc Receiver (FREESTYLE LIBRE 2 READER) DEVI USE 1 EACH BY DOES NOT APPLY ROUTE ONCE FOR 1 DOSE. 1 each 1  ? Continuous Blood Gluc Sensor (FREESTYLE LIBRE 2 SENSOR) MISC Use as instructed to check blood sugar change every 14 days. 6 each 3  ? Dulaglutide (TRULICITY) 3 MG/0.5ML SOPN Inject 3 mg into the  skin once a week. 6 mL 3  ? ergocalciferol (VITAMIN D2) 50000 UNITS capsule Take 50,000 Units by mouth every Monday.    ? ezetimibe-simvastatin (VYTORIN) 10-40 MG tablet Take 1 tablet by mouth at bedtime. 90 tablet 3  ? hydrochlorothiazide (HYDRODIURIL) 25 MG tablet Take 12.5 mg by mouth every morning.     ? insulin degludec (TRESIBA FLEXTOUCH) 200 UNIT/ML FlexTouch Pen INJECT 30-40 UNITS INTO THE SKIN DAILY. 9 mL 1  ? loratadine (CLARITIN) 10  MG tablet Take 10 mg by mouth daily.    ? metFORMIN (GLUCOPHAGE-XR) 500 MG 24 hr tablet TAKE 2 TABLETS BY MOUTH EVERY DAY WITH BREAKFAST 180 tablet 3  ? NOVOLOG FLEXPEN 100 UNIT/ML FlexPen INJECT 20-30 UNITS INTO THE SKIN 3 (THREE) TIMES DAILY BEFORE MEALS. 45 mL 3  ? ondansetron (ZOFRAN ODT) 4 MG disintegrating tablet Take 1 tablet (4 mg total) by mouth every 8 (eight) hours as needed for nausea or vomiting. 30 tablet 0  ? oxyCODONE (ROXICODONE) 5 MG/5ML solution Take 5-10 mLs (5-10 mg total) by mouth every 4 (four) hours as needed for moderate pain or severe pain.  0  ? pantoprazole (PROTONIX) 40 MG tablet Take 1 tablet (40 mg total) by mouth daily. 30 tablet 0  ? valsartan (DIOVAN) 160 MG tablet Take 160 mg by mouth every morning.     ? ?No current facility-administered medications on file prior to visit.  ? ?Allergies  ?Allergen Reactions  ? Penicillins Itching and Rash  ? Sulfa Antibiotics Itching and Rash  ? ?Family History  ?Problem Relation Age of Onset  ? Diabetes Mother   ? Diabetes Sister   ? ?PE: ?BP 138/78 (BP Location: Right Arm, Patient Position: Sitting, Cuff Size: Normal)   Pulse 78   Ht 5\' 9"  (1.753 m)   Wt 274 lb 12.8 oz (124.6 kg)   SpO2 99%   BMI 40.58 kg/m?  ?Wt Readings from Last 3 Encounters:  ?08/12/21 274 lb 12.8 oz (124.6 kg)  ?05/11/21 265 lb 9.6 oz (120.5 kg)  ?02/03/21 264 lb 3.2 oz (119.8 kg)  ? ?Constitutional: overweight, in NAD ?Eyes: PERRLA, EOMI, no exophthalmos ?ENT: moist mucous membranes, no thyromegaly, no cervical  lymphadenopathy ?Cardiovascular: RRR, No MRG ?Respiratory: CTA B ?Musculoskeletal: no deformities, strength intact in all 4 ?Skin: moist, warm, no rashes ?Neurological: no tremor with outstretched hands, DTR normal in all 4 ?

## 2021-08-12 NOTE — Patient Instructions (Addendum)
Please continue: ?- Metformin ER 1000 mg with breakfast and lunch ?- Trulicity 3 mg weekly ?- Tresiba 42 units daily ? ?Change: ?- Novolog ?- 24-28 units before b'fast and dinner ?- 18-20 units before lunch ? ?Stop the [employer] coffee. If you have coffee at home, you may need to take Nvolog 6-10 units before coffee. ? ?Please return in 3-4 months. ?

## 2021-08-16 ENCOUNTER — Other Ambulatory Visit: Payer: Self-pay | Admitting: Internal Medicine

## 2021-09-02 DIAGNOSIS — N184 Chronic kidney disease, stage 4 (severe): Secondary | ICD-10-CM | POA: Insufficient documentation

## 2021-09-02 DIAGNOSIS — N1832 Chronic kidney disease, stage 3b: Secondary | ICD-10-CM

## 2021-09-02 HISTORY — DX: Chronic kidney disease, stage 4 (severe): N18.4

## 2021-11-08 ENCOUNTER — Encounter: Payer: Self-pay | Admitting: Neurology

## 2021-11-23 DIAGNOSIS — G4700 Insomnia, unspecified: Secondary | ICD-10-CM | POA: Insufficient documentation

## 2021-11-23 HISTORY — DX: Insomnia, unspecified: G47.00

## 2021-11-30 ENCOUNTER — Ambulatory Visit: Payer: Medicare Other | Admitting: Neurology

## 2021-11-30 DIAGNOSIS — I639 Cerebral infarction, unspecified: Secondary | ICD-10-CM | POA: Insufficient documentation

## 2021-11-30 HISTORY — DX: Cerebral infarction, unspecified: I63.9

## 2021-12-02 DIAGNOSIS — F411 Generalized anxiety disorder: Secondary | ICD-10-CM

## 2021-12-02 HISTORY — DX: Generalized anxiety disorder: F41.1

## 2021-12-04 ENCOUNTER — Other Ambulatory Visit: Payer: Self-pay | Admitting: Internal Medicine

## 2021-12-09 NOTE — Progress Notes (Deleted)
NEUROLOGY CONSULTATION NOTE  Emily Leon MRN: [id number] DOB: March 17, 1951  Referring provider: Aggie Cosier, MD Primary care provider: Aggie Cosier, MD  Reason for consult:  stroke  Assessment/Plan:   ***   Subjective:  Emily Leon is a 71 year old female with HTN, DM II, sleep apnea, and CKD who presents for stroke.  History supplemented by hospital records.  On 11/01/2021, ***.  She was admitted to [facility] in [place].  CT head showed no acute intracranial abnormality but follow up MRI of brain revealed acute infarcts within the left frontoparietal lobes and left centrum semiovale.  CTA of head and neck showed severe stenosis versus partially occlusive thrombus of the left MCA involving the distal left M1 segment/left MCA bifurcation.  ***.  2D echocardiogram showed EF 60-65% without thrombus, significant valvulopathy or interatrial shunt.     ED 8/1 difficulty breathing *** generalized weakness ED 8/9  PAST MEDICAL HISTORY: Past Medical History:  Diagnosis Date   Arthritis    Bronchitis    hx of    Cataracts, bilateral    Chronic kidney disease    Cough    Diabetes mellitus without complication (HCC)    Hypertension    Shortness of breath dyspnea    walking distances or climbing stairs   Sleep apnea    uses CPAP   Tingling    toes bilat    Urinary frequency     PAST SURGICAL HISTORY: Past Surgical History:  Procedure Laterality Date   ABDOMINAL HYSTERECTOMY     BREATH TEK H PYLORI N/A 08/26/2014   Procedure: BREATH TEK H PYLORI;  Surgeon: [facility], MD;  Location: [facility];  Service: General;  Laterality: N/A;   KNEE SURGERY Left 2009   LAPAROSCOPIC GASTRIC SLEEVE RESECTION N/A 11/11/2014   Procedure: LAPAROSCOPIC GASTRIC SLEEVE RESECTION;  Surgeon: [facility], MD;  Location: [facility];  Service: General;  Laterality: N/A;   UPPER GI ENDOSCOPY  11/11/2014   Procedure: UPPER GI ENDOSCOPY;  Surgeon: [facility], MD;  Location: [facility];  Service:  General;;    MEDICATIONS: Current Outpatient Medications on File Prior to Visit  Medication Sig Dispense Refill   amLODipine (NORVASC) 10 MG tablet Take 10 mg by mouth every morning.     atenolol (TENORMIN) 100 MG tablet Take 100 mg by mouth every morning.      B-D UF III MINI PEN NEEDLES 31G X 5 MM MISC USE 4 TIMES DAILY AS DIRECTED 400 each 1   Continuous Blood Gluc Receiver (FREESTYLE LIBRE 2 READER) DEVI USE 1 EACH BY DOES NOT APPLY ROUTE ONCE FOR 1 DOSE. 1 each 1   Continuous Blood Gluc Sensor (FREESTYLE LIBRE 2 SENSOR) MISC Use as instructed to check blood sugar change every 14 days. 6 each 3   ergocalciferol (VITAMIN D2) 50000 UNITS capsule Take 50,000 Units by mouth every Monday.     ezetimibe-simvastatin (VYTORIN) 10-40 MG tablet Take 1 tablet by mouth at bedtime. 90 tablet 3   hydrochlorothiazide (HYDRODIURIL) 25 MG tablet Take 12.5 mg by mouth every morning.      loratadine (CLARITIN) 10 MG tablet Take 10 mg by mouth daily.     metFORMIN (GLUCOPHAGE-XR) 500 MG 24 hr tablet TAKE 2 TABLETS BY MOUTH EVERY DAY WITH BREAKFAST 180 tablet 3   NOVOLOG FLEXPEN 100 UNIT/ML FlexPen INJECT 20-30 UNITS INTO THE SKIN 3 (THREE) TIMES DAILY BEFORE MEALS. 45 mL 3   ondansetron (ZOFRAN ODT) 4 MG disintegrating tablet Take 1 tablet (  4 mg total) by mouth every 8 (eight) hours as needed for nausea or vomiting. 30 tablet 0   oxyCODONE (ROXICODONE) 5 MG/5ML solution Take 5-10 mLs (5-10 mg total) by mouth every 4 (four) hours as needed for moderate pain or severe pain.  0   pantoprazole (PROTONIX) 40 MG tablet Take 1 tablet (40 mg total) by mouth daily. 30 tablet 0   TRESIBA FLEXTOUCH 200 UNIT/ML FlexTouch Pen INJECT 30-40 UNITS INTO THE SKIN DAILY 9 mL 1   TRULICITY 3 MG/0.5ML SOPN INJECT 3 MG INTO THE SKIN ONCE A WEEK. 6 mL 3   valsartan (DIOVAN) 160 MG tablet Take 160 mg by mouth every morning.      No current facility-administered medications on file prior to visit.    ALLERGIES: Allergies   Allergen Reactions   Cefdinir Diarrhea   Cephalexin Rash   Metoprolol Rash   Penicillins Itching and Rash   Sulfa Antibiotics Itching and Rash    FAMILY HISTORY: Family History  Problem Relation Age of Onset   Diabetes Mother    Diabetes Sister     Objective:  *** General: No acute distress.  Patient appears well-groomed.   Head:  Normocephalic/atraumatic Eyes:  fundi examined but not visualized Neck: supple, no paraspinal tenderness, full range of motion Back: No paraspinal tenderness Heart: regular rate and rhythm Lungs: Clear to auscultation bilaterally. Vascular: No carotid bruits. Neurological Exam: Mental status: alert and oriented to person, place, and time, speech fluent and not dysarthric, language intact. Cranial nerves: CN I: not tested CN II: pupils equal, round and reactive to light, visual fields intact CN III, IV, VI:  full range of motion, no nystagmus, no ptosis CN V: facial sensation intact. CN VII: upper and lower face symmetric CN VIII: hearing intact CN IX, X: gag intact, uvula midline CN XI: sternocleidomastoid and trapezius muscles intact CN XII: tongue midline Bulk & Tone: normal, no fasciculations. Motor:  muscle strength 5/5 throughout Sensation:  Pinprick, temperature and vibratory sensation intact. Deep Tendon Reflexes:  2+ throughout,  toes downgoing.   Finger to nose testing:  Without dysmetria.   Heel to shin:  Without dysmetria.   Gait:  Normal station and stride.  Romberg negative.    Thank you for allowing me to take part in the care of this patient.  Shon Millet, DO  CC: Aggie Cosier, MD

## 2021-12-10 ENCOUNTER — Ambulatory Visit: Payer: Medicare Other | Admitting: Neurology

## 2021-12-14 NOTE — Progress Notes (Unsigned)
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  Hypertension Type 2 diabetes mellitus Hyperlipidemia Obstructive sleep apnea   As patient has had at least 2 symptomatic events, would refer to endovascular radiology for evaluation and possible treatment for severe left MCA stenosis.  In meantime, would recommend keeping SBP slightly elevated (SBP 130-150) as managed by PCP Other secondary stroke prevention management as per PCP: Continue DAPT (ASA 81mg  and Brilinta 90mg  BID)  Statin.  LDL goal less than 70 Optimize glycemic control.  Hgb A1c goal less than 7 CPAP Recommend she follow up with her eye doctor for formal eye exam including visual field testing.   No driving.  Remain out of work at least until follow up with me in 3 months. Follow up 3 months.  Total time spent with patient reviewing hospital records and face to face:  63 minutes   Subjective:  Emily Leon is a 71 year old female with HTN, DM II, sleep apnea, and CKD who presents for stroke.  History supplemented by hospital records.  She is accompanied by her son and sister who also supplements history.    Around 10/30/2021, she began experiencing intermittent episodes of slurred speech with word-finding difficulty, each lasting less than 10 minutes. She also endorsed mild intermittent headaches.  No facial droop, vision loss or unilateral numbness or weakness.  2 days later, she presented to [facility] in [place] where CT head showed no acute intracranial abnormality but CTA of head and neck showed severe stenosis versus partially occlusive thrombus of the left MCA involving the distal left M1 segment/left MCA  bifurcation.  She was outside the window for tPA or endovascular intervention.  She was admitted for stroke workup.  MRI of brain revealed acute infarcts within the left frontoparietal lobes and left centrum semiovale. .  2D echocardiogram showed EF 60-65% with grade 1 diastolic dysfunction with severely increased left ventricular wall thickness but without thrombus, significant valvulopathy or interatrial shunt.  LDL was 82 and Hgb A1c was 10.5,  Due to recurrent transient symptoms, she had an EEG to rule out possible seizure activity, which was within normal limits.  Prior to admission, she was not on antiplatelet therapy.  She was discharged on both ASA 81mg  and Plavix 75mg  daily   She was readmitted on 7/22 for acute dysarthria and dizziness.  CT head showed no acute intracranial abnormality.  MRI of brain was negative for new acute infarct.  Neurology recommended permissive hypertension and she was monitored for 24 hours.  In case dizziness was related to Plavix, she was switched to Brilinta.    Following discharge, she has experienced significant anxiety.  She reports continuous dizziness.  When I ask her to describe the dizziness, it is a fuzzy vision, not lightheadedness or vertigo.  On 8/1, she was seen for difficulty breathing.  On 8/9, she was evaluated for generalized weakness.  On 8/24, she was seen again for ongoing dizziness and blurred vision.  CT head revealed no acute findings.  Also, since discharge, she reports left posterior neck pain with palpation.    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    Current medications:  ASA 81mg , Brilinta  90mg  BID, atenolol, amlodipine, hydralazine, losartan, Zetia, mefformin, Novolog, Trylicity, Tresiba   PAST MEDICAL HISTORY: Past Medical History:  Diagnosis Date   Arthritis    Bronchitis    hx of    Cataracts, bilateral    Chronic kidney disease    Cough    Diabetes mellitus without complication (HCC)    Hypertension     Shortness of breath dyspnea    walking distances or climbing stairs   Sleep apnea    uses CPAP   Tingling    toes bilat    Urinary frequency     PAST SURGICAL HISTORY: Past Surgical History:  Procedure Laterality Date   ABDOMINAL HYSTERECTOMY     BREATH TEK H PYLORI N/A 08/26/2014   Procedure: BREATH TEK H PYLORI;  Surgeon: 10/26/2014, MD;  [facility];  Service: General;  Laterality: N/A;   KNEE SURGERY Left 2009   LAPAROSCOPIC GASTRIC SLEEVE RESECTION N/A 11/11/2014   Procedure: LAPAROSCOPIC GASTRIC SLEEVE RESECTION;  Surgeon: 11/13/2014, MD;  Location: [facility];  Service: General;  Laterality: N/A;   UPPER GI ENDOSCOPY  11/11/2014   Procedure: UPPER GI ENDOSCOPY;  Surgeon: 11/13/2014, MD;  Location: [facility];  Service: General;;    MEDICATIONS: Current Outpatient Medications on File Prior to Visit  Medication Sig Dispense Refill   amLODipine (NORVASC) 10 MG tablet Take 10 mg by mouth every morning.     atenolol (TENORMIN) 100 MG tablet Take 100 mg by mouth every morning.      B-D UF III MINI PEN NEEDLES 31G X 5 MM MISC USE 4 TIMES DAILY AS DIRECTED 400 each 1   Continuous Blood Gluc Receiver (FREESTYLE LIBRE 2 READER) DEVI USE 1 EACH BY DOES NOT APPLY ROUTE ONCE FOR 1 DOSE. 1 each 1   Continuous Blood Gluc Sensor (FREESTYLE LIBRE 2 SENSOR) MISC Use as instructed to check blood sugar change every 14 days. 6 each 3   ergocalciferol (VITAMIN D2) 50000 UNITS capsule Take 50,000 Units by mouth every Monday.     ezetimibe-simvastatin (VYTORIN) 10-40 MG tablet Take 1 tablet by mouth at bedtime. 90 tablet 3   hydrochlorothiazide (HYDRODIURIL) 25 MG tablet Take 12.5 mg by mouth every morning.      loratadine (CLARITIN) 10 MG tablet Take 10 mg by mouth daily.     metFORMIN (GLUCOPHAGE-XR) 500 MG 24 hr tablet TAKE 2 TABLETS BY MOUTH EVERY DAY WITH BREAKFAST 180 tablet 3   NOVOLOG FLEXPEN 100 UNIT/ML FlexPen INJECT 20-30 UNITS INTO THE SKIN 3 (THREE) TIMES DAILY BEFORE MEALS. 45  mL 3   ondansetron (ZOFRAN ODT) 4 MG disintegrating tablet Take 1 tablet (4 mg total) by mouth every 8 (eight) hours as needed for nausea or vomiting. 30 tablet 0   oxyCODONE (ROXICODONE) 5 MG/5ML solution Take 5-10 mLs (5-10 mg total) by mouth every 4 (four) hours as needed for moderate pain or severe pain.  0   pantoprazole (PROTONIX) 40 MG tablet Take 1 tablet (40 mg total) by mouth daily. 30 tablet 0   TRESIBA FLEXTOUCH 200 UNIT/ML FlexTouch Pen INJECT 30-40 UNITS INTO THE SKIN DAILY 9 mL 1   TRULICITY 3 MG/0.5ML SOPN INJECT 3 MG INTO THE SKIN ONCE A WEEK. 6 mL 3   valsartan (DIOVAN) 160 MG tablet Take 160 mg by mouth every morning.      No current facility-administered medications on file prior to visit.    ALLERGIES: Allergies  Allergen Reactions   Cefdinir Diarrhea   Cephalexin  Rash   Metoprolol Rash   Penicillins Itching and Rash   Sulfa Antibiotics Itching and Rash    FAMILY HISTORY: Family History  Problem Relation Age of Onset   Diabetes Mother    Diabetes Sister     Objective:  Blood pressure (!) 149/70, pulse 69, height 5\' 9"  (1.753 m), weight 254 lb (115.2 kg), SpO2 97 %. General: No acute distress.  Patient appears well-groomed.   Head:  Normocephalic/atraumatic Eyes:  fundi examined but not visualized Neck: supple, no paraspinal tenderness, full range of motion Back: No paraspinal tenderness Heart: regular rate and rhythm Lungs: Clear to auscultation bilaterally. Vascular: No carotid bruits. Neurological Exam: Mental status: alert and oriented to person, place, and time, speech fluent and not dysarthric, some difficulty with naming, difficulty with 3 step commands, right sided hemineglect Cranial nerves: CN I: not tested CN II: pupils equal, round and reactive to light, visual fields intact CN III, IV, VI:  full range of motion, no nystagmus, no ptosis CN V: facial sensation intact. CN VII: upper and lower face symmetric CN VIII: hearing intact CN IX, X:  gag intact, uvula midline CN XI: sternocleidomastoid and trapezius muscles intact CN XII: tongue midline Bulk & Tone: normal, no fasciculations. Motor:  muscle strength 5/5 throughout Sensation:  Pinprick, temperature sensation intact.  Decreased vibratory sensation in feet. Deep Tendon Reflexes:  2+ throughout,  toes downgoing.   Finger to nose testing:  Without dysmetria.   Gait:  Cautious broad-based gait.  Romberg negative.    Thank you for allowing me to take part in the care of this patient.  , DO  CC: Shon Millet, MD

## 2021-12-15 ENCOUNTER — Ambulatory Visit: Payer: Medicare Other | Admitting: Internal Medicine

## 2021-12-15 ENCOUNTER — Encounter: Payer: Self-pay | Admitting: Neurology

## 2021-12-15 ENCOUNTER — Ambulatory Visit: Payer: Medicare Other | Admitting: Neurology

## 2021-12-15 ENCOUNTER — Encounter: Payer: Self-pay | Admitting: Internal Medicine

## 2021-12-15 ENCOUNTER — Telehealth: Payer: Self-pay | Admitting: Internal Medicine

## 2021-12-15 VITALS — BP 149/70 | HR 69 | Ht 69.0 in | Wt 254.0 lb

## 2021-12-15 VITALS — BP 140/82 | HR 79 | Ht 69.0 in | Wt 254.8 lb

## 2021-12-15 DIAGNOSIS — I69320 Aphasia following cerebral infarction: Secondary | ICD-10-CM | POA: Diagnosis not present

## 2021-12-15 DIAGNOSIS — E782 Mixed hyperlipidemia: Secondary | ICD-10-CM | POA: Diagnosis not present

## 2021-12-15 DIAGNOSIS — I10 Essential (primary) hypertension: Secondary | ICD-10-CM | POA: Diagnosis not present

## 2021-12-15 DIAGNOSIS — E669 Obesity, unspecified: Secondary | ICD-10-CM

## 2021-12-15 DIAGNOSIS — E1142 Type 2 diabetes mellitus with diabetic polyneuropathy: Secondary | ICD-10-CM

## 2021-12-15 DIAGNOSIS — E1165 Type 2 diabetes mellitus with hyperglycemia: Secondary | ICD-10-CM

## 2021-12-15 DIAGNOSIS — E785 Hyperlipidemia, unspecified: Secondary | ICD-10-CM | POA: Diagnosis not present

## 2021-12-15 DIAGNOSIS — G4733 Obstructive sleep apnea (adult) (pediatric): Secondary | ICD-10-CM

## 2021-12-15 DIAGNOSIS — I63512 Cerebral infarction due to unspecified occlusion or stenosis of left middle cerebral artery: Secondary | ICD-10-CM

## 2021-12-15 LAB — POCT GLYCOSYLATED HEMOGLOBIN (HGB A1C): Hemoglobin A1C: 10.6 % — AB (ref 4.0–5.6)

## 2021-12-15 MED ORDER — FREESTYLE LIBRE 2 SENSOR MISC: 3 refills | Status: DC

## 2021-12-15 MED ORDER — FREESTYLE LIBRE 2 READER DEVI: 1 refills | Status: DC

## 2021-12-15 NOTE — Progress Notes (Signed)
Patient ID: Emily Leon, female   DOB: 10/21/1950, 71 y.o.   MRN: [id number]   HPI: Emily Leon is a 71 y.o.-year-old female, initially referred by her PCP, Dr. Shearon Leon, returning for follow-up for DM2, dx at 71 y/o, insulin-dependent, uncontrolled, with long-term complications (CKD, PN, fatty liver).  She previously saw Dr. Dorris Leon, last visit with him 02/19/2020.  Last visit 4 months ago.  Interim history: No increased urination, blurry vision, chest pain. She has diarrhea if she eats early in am. Before last visit, she stopped sweet drinks.  Sugars improved and she started to feel better.  I also advised her to stop the [employer] coffee.  However, at this visit, she finally confirms that she is still drinking the coffee in the morning.  Sugars are worse. Since last visit, she had a stroke.  She feels poorly >> nausea, dizziness. Meclizine did not help. She sees neurology today.  Reviewed HbA1c levels: Lab Results  Component Value Date   HGBA1C 9.5 (A) 08/12/2021   HGBA1C 10.8 (A) 05/11/2021   HGBA1C 10.1 (A) 02/03/2021   HGBA1C 9.8 (A) 08/03/2020   HGBA1C 10.7 (A) 04/30/2020   HGBA1C 11.1 (A) 02/19/2020   HGBA1C 9.7 (A) 11/20/2019   HGBA1C 9.1 (A) 04/25/2019   HGBA1C 8.8 11/22/2018   HGBA1C 9.7 07/10/2018   Previously on: - Metformin ER 1000 mg with breakfast and lunch - Trulicity 1.5 mg weekly - U500 insulin 60-60-40 units 3x a day started 2 mo >> now 60 units before B b/c 40s and 50s Prev. On Novolog and [place].  Then to: - Metformin ER 1000 mg with breakfast and lunch - Trulicity 3 mg weekly - Tresiba 36 >> 42 units daily  - Novolog 20-22-26 >> 24-28 units >> - 24-28 units before b'fast and dinner >> 20 units before b'fast (cereals) and 25-30 units before dinner - 18-20 units >> 25 units before lunch  She checks her sugars more than 4 times a day with her freestyle libre CGM -latest data is from 10/2021 (last mo) as she could not check her sensor with her phone and  did not have a receiver:   Previously   Previously:   Lowest sugar was 40 (at night - ? Why) >> 70 >> 60 >> 50s; she has hypoglycemia awareness at 72.  Highest sugar was  500 >> .Marland KitchenMarland Kitchen 300s >> 300s.  Pt's meals are: - 6 am: [employer] coffee - Breakfast: 8 am: egg, toast, fruit >> cereals, bananas - Lunch: salad and fruit - Dinner: baked chicken, salad, fruit - Snacks: -  -+ Mild CKD, last BUN/creatinine:  12/06/2021:  Ref Range & Units   Glucose 70 - 99 mg/dL 219 High    BUN 8 - 27 mg/dL 19   Creatinine 0.57 - 1.00 mg/dL 1.96 High    eGFR CKD-EPI CR 2021 >59 mL/min/1.73 27 Low    BUN/Creatinine Ratio 12 - 28 10 Low    Sodium 134 - 144 mmol/L 140   Potassium 3.5 - 5.2 mmol/L 3.9   Chloride 96 - 106 mmol/L 100   Bicarbonate (CO2) 20 - 29 mmol/L 23   Calcium 8.7 - 10.3 mg/dL 9.6   Phosphorus 3.0 - 4.3 mg/dL 3.1   Albumin 3.9 - 4.9 g/dL 4.0    09/02/2021: 20/1.63, GFR 34; protein to creatinine ratio 1639 Lab Results  Component Value Date   BUN 21 02/03/2021   BUN 13 10/15/2019   CREATININE 1.55 (H) 02/03/2021   CREATININE 1.0 10/15/2019  On valsartan 160 mg daily.  -+ HL; last set of lipids: Lab Results  Component Value Date   CHOL 184 02/03/2021   HDL 38.80 (L) 02/03/2021   LDLCALC 116 (H) 02/03/2021   TRIG 142.0 02/03/2021   CHOLHDL 5 02/03/2021  09/24/2020 (in [facility] with sinusitis): Glu 281, serum osm 301 (275-300), BUN/creatinine 12/1.4, GFR 41, CO2 29 09/09/2020: 20/1.55, GFR 40, glucose 270 06/02/2020: Glu 352, BUN/Cr 46/1.71, GFR 35 On Zetia-simvastatin 10-40 mg daily.  - last eye exam was in Fall 2022: No DR reportedly, + macular edema. Has a retina specialist in [place].   - + numbness and tingling in her feet. On B12 and Benfotiamine.  She sees podiatry.  Last foot exam 07/2021.  Pt has FH of DM in sister and unclear in mother  She also has a history of sleeve gastrectomy 2016, OSA-on CPAP.  ROS: + See HPI + Joint aches  I reviewed pt's  medications, allergies, PMH, social hx, family hx, and changes were documented in the history of present illness. Otherwise, unchanged from my initial visit note.  Past Medical History:  Diagnosis Date   Arthritis    Bronchitis    hx of    Cataracts, bilateral    Chronic kidney disease    Cough    Diabetes mellitus without complication (HCC)    Hypertension    Shortness of breath dyspnea    walking distances or climbing stairs   Sleep apnea    uses CPAP   Tingling    toes bilat    Urinary frequency    Past Surgical History:  Procedure Laterality Date   ABDOMINAL HYSTERECTOMY     BREATH TEK H PYLORI N/A 08/26/2014   Procedure: BREATH TEK H PYLORI;  Surgeon: Greer Pickerel, MD;  Location: [facility];  Service: General;  Laterality: N/A;   KNEE SURGERY Left 2009   LAPAROSCOPIC GASTRIC SLEEVE RESECTION N/A 11/11/2014   Procedure: LAPAROSCOPIC GASTRIC SLEEVE RESECTION;  Surgeon: Greer Pickerel, MD;  Location: [facility];  Service: General;  Laterality: N/A;   UPPER GI ENDOSCOPY  11/11/2014   Procedure: UPPER GI ENDOSCOPY;  Surgeon: Greer Pickerel, MD;  Location: [facility];  Service: General;;   Social History   Socioeconomic History   Marital status: Married    Spouse name: Not on file   Number of children: 1   Years of education: Not on file   Highest education level: Not on file  Occupational History   Occupation: Retired  Tobacco Use   Smoking status: Never   Smokeless tobacco: Never  Vaping Use   Vaping Use: Never used  Substance and Sexual Activity   Alcohol use: No   Drug use: No   Sexual activity: Not on file  Other Topics Concern   Not on file  Social History Narrative   Not on file   Social Determinants of Health   Financial Resource Strain: Not on file  Food Insecurity: Not on file  Transportation Needs: Not on file  Physical Activity: Not on file  Stress: Not on file  Social Connections: Not on file  Intimate Partner Violence: Not on file   Current Outpatient  Medications on File Prior to Visit  Medication Sig Dispense Refill   amLODipine (NORVASC) 10 MG tablet Take 10 mg by mouth every morning.     atenolol (TENORMIN) 100 MG tablet Take 100 mg by mouth every morning.      B-D UF III MINI PEN NEEDLES 31G X 5 MM  MISC USE 4 TIMES DAILY AS DIRECTED 400 each 1   Continuous Blood Gluc Receiver (FREESTYLE LIBRE 2 READER) DEVI USE 1 EACH BY DOES NOT APPLY ROUTE ONCE FOR 1 DOSE. 1 each 1   Continuous Blood Gluc Sensor (FREESTYLE LIBRE 2 SENSOR) MISC Use as instructed to check blood sugar change every 14 days. 6 each 3   ergocalciferol (VITAMIN D2) 50000 UNITS capsule Take 50,000 Units by mouth every Monday.     ezetimibe-simvastatin (VYTORIN) 10-40 MG tablet Take 1 tablet by mouth at bedtime. 90 tablet 3   hydrochlorothiazide (HYDRODIURIL) 25 MG tablet Take 12.5 mg by mouth every morning.      loratadine (CLARITIN) 10 MG tablet Take 10 mg by mouth daily.     metFORMIN (GLUCOPHAGE-XR) 500 MG 24 hr tablet TAKE 2 TABLETS BY MOUTH EVERY DAY WITH BREAKFAST 180 tablet 3   NOVOLOG FLEXPEN 100 UNIT/ML FlexPen INJECT 20-30 UNITS INTO THE SKIN 3 (THREE) TIMES DAILY BEFORE MEALS. 45 mL 3   ondansetron (ZOFRAN ODT) 4 MG disintegrating tablet Take 1 tablet (4 mg total) by mouth every 8 (eight) hours as needed for nausea or vomiting. 30 tablet 0   oxyCODONE (ROXICODONE) 5 MG/5ML solution Take 5-10 mLs (5-10 mg total) by mouth every 4 (four) hours as needed for moderate pain or severe pain.  0   pantoprazole (PROTONIX) 40 MG tablet Take 1 tablet (40 mg total) by mouth daily. 30 tablet 0   TRESIBA FLEXTOUCH 200 UNIT/ML FlexTouch Pen INJECT 30-40 UNITS INTO THE SKIN DAILY 9 mL 1   TRULICITY 3 EX/9.3ZJ SOPN INJECT 3 MG INTO THE SKIN ONCE A WEEK. 6 mL 3   valsartan (DIOVAN) 160 MG tablet Take 160 mg by mouth every morning.      No current facility-administered medications on file prior to visit.   Allergies  Allergen Reactions   Cefdinir Diarrhea   Cephalexin Rash    Metoprolol Rash   Penicillins Itching and Rash   Sulfa Antibiotics Itching and Rash   Family History  Problem Relation Age of Onset   Diabetes Mother    Diabetes Sister    PE: BP (!) 140/82 (BP Location: Right Arm, Patient Position: Sitting, Cuff Size: Normal)   Pulse 79   Ht _0  (1.753 m)   Wt 254 lb 12.8 oz (115.6 kg)   SpO2 98%   BMI 37.63 kg/m  Wt Readings from Last 3 Encounters:  12/15/21 254 lb 12.8 oz (115.6 kg)  08/12/21 274 lb 12.8 oz (124.6 kg)  05/11/21 265 lb 9.6 oz (120.5 kg)   Constitutional: overweight, in NAD Eyes: no exophthalmos ENT: moist mucous membranes, no masses palpated in neck, no cervical lymphadenopathy Cardiovascular: RRR, No MRG Respiratory: CTA B Musculoskeletal: no deformities Skin: moist, warm, no rashes Neurological: no tremor with outstretched hands  ASSESSMENT: 1. DM2, insulin-dependent, uncontrolled, with long-term complications - mild CKD - fatty liver - PN  2. HL  3.  Obesity class II  PLAN:  1. Patient with longstanding, uncontrolled, insulin resistance type 2 diabetes, on oral antidiabetic regimen with metformin ER and also injectable regimen with weekly GLP-1 receptor agonist and basal/bolus insulin regimen, adjusted at last visit.  At that time, sugars were better controlled overnight, mostly at goal but they were increasing significantly after 6 AM and stayed high until 6 PM, after which they started to decrease.  Upon questioning, she was not eating at 6 AM but she was drinking coffee from [employer] or another fast food place.  I strongly advised her to stop this or if she continued to drink it, to cooperate with 6 to 10 units of NovoLog.  I also advised her to take a smaller amount of NovoLog before lunch since this was her smallest meal (a salad usually) we did not change the rest of the regimen. CGM interpretation: -At today's visit, we reviewed her CGM downloads -from last month, patient did not have a receiver to check  lately: It appears that 31% of values are in target range (goal >70%), while 69% are higher than 180 (goal <25%), and 0% are lower than 70 (goal <4%).  The calculated average blood sugar is 232.  The projected HbA1c for the next 3 months (GMI) is 8.9%. -Reviewing the CGM trends, it appears that sugars are very high, better controlled overnight, but increasing steeply, almost to 90 degrees after 7 AM.  Upon questioning, she is still drinking [employer] coffee around 6 AM and she is not covering this with insulin.  At this visit we discussed that without stopping this, we will not be able to control her diabetes.  I advised her to stop this completely.  If she absolutely needs to have coffee in the morning, this will need to be black and she may need to cover it with insulin (approximately 6 units). -Also, insulin, she is using a lower amount of insulin for breakfast, but since breakfast does not appear to necessarily be the culprit for the very elevated blood sugars in the morning, we will continue this dose.  Since the sugars are trending down in the afternoon to bedtime, I advised her to take less insulin before dinner.  Otherwise, we can continue the same regimen. - I suggested to:  Patient Instructions  Please continue: - Metformin ER 1000 mg with breakfast and lunch - Trulicity 3 mg weekly - Tresiba 42 units daily  Change: - Novolog - 20 units before b'fast  - 25 units before lunch - 20 units before dinner  Stop the [employer] coffee!  If you have coffee at home (black), you may need to take Novolog 6 units before coffee.  Please return in 3-4 months.  - we checked her HbA1c: 10.6% (higher) - advised to check sugars at different times of the day - 4x a day, rotating check times - advised for yearly eye exams >> she is UTD - return to clinic in 3-4 months  2. HL -Reviewed latest lipid panel from 01/2021: LDL above our target of less than 70, HDL slightly low: Lab Results  Component  Value Date   CHOL 184 02/03/2021   HDL 38.80 (L) 02/03/2021   LDLCALC 116 (H) 02/03/2021   TRIG 142.0 02/03/2021   CHOLHDL 5 02/03/2021  -She continues on simvastatin 40 mg daily and Zetia 10 mg daily without side effects  3. Obesity class II -We will continue her GLP-1 receptor agonist which should also help with weight loss -Before last visit, she gained 9 pounds.  Today's visit, I again strongly advised her to stop the [employer] coffee. - she lost 20 pounds in the last 4 mo, possibly contributed to by feeling poorly  Philemon Kingdom, MD PhD [facility]

## 2021-12-15 NOTE — Telephone Encounter (Signed)
Pharmacy contacted and advised previously sent rx was on hold but will be released for pt to pick up.

## 2021-12-15 NOTE — Patient Instructions (Signed)
Continue aspirin and Brilinta  Refer to Dr. Julieanne Cotton for evaluation of left middle cerebral artery stenosis Keep systolic blood pressure 130-150 Continue statin, blood pressure and diabetes medication Get eye exam with visual field testing and have notes sent to me fax [phone] No driving.  I want you out of work until at least follow up with me in 3 months.

## 2021-12-15 NOTE — Telephone Encounter (Signed)
MEDICATION: NovoLOG FlexPen NOVOLOG FLEXPEN 100 UNIT/ML FlexPen  PHARMACY:  [id number] - [place], [place] - [address] (Ph: [phone])  HAS THE PATIENT CONTACTED THEIR PHARMACY?  No  IS THIS A 90 DAY SUPPLY : Unsure  IS PATIENT OUT OF MEDICATION: Unsure  IF NOT; HOW MUCH IS LEFT:   LAST APPOINTMENT DATE: @8 /30/2023  NEXT APPOINTMENT DATE:@12 /08/2021  DO WE HAVE YOUR PERMISSION TO LEAVE A DETAILED MESSAGE?: YES  OTHER COMMENTS:    **Let patient know to contact pharmacy at the end of the day to make sure medication is ready. **  ** Please notify patient to allow 48-72 hours to process**  **Encourage patient to contact the pharmacy for refills or they can request refills through [facility]**

## 2021-12-15 NOTE — Patient Instructions (Addendum)
Please continue: - Metformin ER 1000 mg with breakfast and lunch - Trulicity 3 mg weekly - Tresiba 42 units daily  Change: - Novolog - 20 units before b'fast  - 25 units before lunch - 20 units before dinner  Stop the [employer] coffee!  If you have coffee at home (black), you may need to take Novolog 6 units before coffee.  Please return in 3-4 months.

## 2021-12-16 ENCOUNTER — Other Ambulatory Visit: Payer: Self-pay | Admitting: Neurology

## 2021-12-16 MED ORDER — ONDANSETRON 4 MG PO TBDP: 4.0000 mg | ORAL_TABLET | Freq: Three times a day (TID) | ORAL | 5 refills | Status: AC | PRN

## 2021-12-28 DIAGNOSIS — R0609 Other forms of dyspnea: Secondary | ICD-10-CM

## 2021-12-28 HISTORY — DX: Other forms of dyspnea: R06.09

## 2022-01-06 ENCOUNTER — Other Ambulatory Visit: Payer: Self-pay

## 2022-01-06 DIAGNOSIS — E1165 Type 2 diabetes mellitus with hyperglycemia: Secondary | ICD-10-CM

## 2022-01-06 MED ORDER — FREESTYLE LIBRE 3 SENSOR MISC: 1.0000 | 3 refills | Status: DC

## 2022-01-06 MED ORDER — FREESTYLE LIBRE READER DEVI: 0 refills | Status: DC

## 2022-01-19 ENCOUNTER — Other Ambulatory Visit: Payer: Self-pay

## 2022-01-19 DIAGNOSIS — E1142 Type 2 diabetes mellitus with diabetic polyneuropathy: Secondary | ICD-10-CM

## 2022-01-19 MED ORDER — FREESTYLE LIBRE READER DEVI: 0 refills | Status: DC

## 2022-01-27 ENCOUNTER — Other Ambulatory Visit: Payer: Self-pay | Admitting: Internal Medicine

## 2022-02-13 ENCOUNTER — Other Ambulatory Visit: Payer: Self-pay | Admitting: Internal Medicine

## 2022-02-13 DIAGNOSIS — E1142 Type 2 diabetes mellitus with diabetic polyneuropathy: Secondary | ICD-10-CM

## 2022-03-15 NOTE — Progress Notes (Signed)
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  She appears to have inconsistent tactile hemineglect bilaterally and drew the clock drawing in a matter that may suggest LEFT sided hemineglect - this wouldn't be explained by her stroke.  Query if she may have had another stroke. 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   Neuropsychological evaluation - scheduled in February Other secondary stroke prevention management as per PCP: Continue DAPT (ASA 81mg  and Brilinta 90mg  BID)  Statin.  LDL goal less than 70 Optimize glycemic control.  Hgb A1c goal less than 7 Blood pressure goal 130-150 systolic.  Contact PCP regarding blood pressure. CPAP No driving.  Remain out of work at least until follow up with me in 3 months. Follow up 3 months (after all testing completed)..   Total time spent with patient reviewing records and face to face:  43 minutes     Subjective:  Emily Leon is a 71 year old female with HTN, DM II, sleep apnea, and CKD who follows up for stroke.  She is accompanied by her sister who supplements history.    UPDATE: Current medications:  ASA 81mg , Brilinta 90mg  BID, atenolol, amlodipine, hydralazine, losartan, Zetia, metformin, Novolog, Trylicity, Theressa Millard  She had a formal eye exam 2 weeks ago which revealed no significant abnormality, including visual field testing.  She endorses intermittent blurred vision.  Also feels sensitive to lights.  Endorses infrequent left sided posterior neck spasms radiating to behind the ear.  No  numbness, pain or weakness down the arm    She was referred to endovascular radiology regarding the severe left MCA stenosis but she never received a call to schedule an appointment.     HISTORY: Around 10/30/2021, she began experiencing intermittent episodes of slurred speech with word-finding difficulty, each lasting less than 10 minutes. She also endorsed mild intermittent headaches.  No facial droop, vision loss or unilateral numbness or weakness.  2 days later, she presented to [facility] in [place] where CT head showed no acute intracranial abnormality but CTA of head and neck showed severe stenosis versus partially occlusive thrombus of the left MCA involving the distal left M1 segment/left MCA bifurcation.  She was outside the window for tPA or endovascular intervention.  She was admitted for stroke workup.  MRI of brain revealed acute infarcts within the left frontoparietal lobes and left centrum semiovale. .  2D echocardiogram showed EF 60-65% with grade 1 diastolic dysfunction with severely increased left ventricular wall thickness but without thrombus, significant valvulopathy or interatrial shunt.  LDL was 82 and Hgb A1c was 10.5,  Due to recurrent transient symptoms, she had an EEG to rule out possible seizure activity, which was within normal limits.  Prior to admission, she was not on antiplatelet therapy.  She was discharged on both ASA 81mg  and Plavix 75mg  daily    She was readmitted on 7/22 for acute dysarthria and dizziness.  CT head showed no acute intracranial abnormality.  MRI of brain was negative for new acute infarct.  Neurology recommended permissive hypertension and she was monitored for 24 hours.  In case dizziness was related to Plavix, she was switched to Brilinta.     Following discharge, she has experienced significant anxiety.  She reports continuous dizziness.  When I ask her to describe the dizziness, it is a fuzzy vision, not lightheadedness or vertigo.  On 8/1, she  was seen for difficulty breathing.  On 8/9, she was evaluated for generalized weakness.  On 8/24, she was seen again for ongoing dizziness and blurred vision.  CT head revealed no acute findings.  Also, since discharge, she reports left posterior neck pain with palpation.     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       PAST MEDICAL HISTORY: Past Medical History:  Diagnosis Date   Arthritis    Bronchitis    hx of    Cataracts, bilateral    Chronic kidney disease    Cough    Diabetes mellitus without complication (HCC)    Hypertension    Shortness of breath dyspnea    walking distances or climbing stairs   Sleep apnea    uses CPAP   Tingling    toes bilat    Urinary frequency     MEDICATIONS: Current Outpatient Medications on File Prior to Visit  Medication Sig Dispense Refill   amLODipine (NORVASC) 10 MG tablet Take 10 mg by mouth every morning.     atenolol (TENORMIN) 100 MG tablet Take 100 mg by mouth every morning.      B-D UF III MINI PEN NEEDLES 31G X 5 MM MISC USE 4 TIMES DAILY AS DIRECTED 400 each 1   BRILINTA 90 MG TABS tablet Take 90 mg by mouth 2 (two) times daily.     busPIRone (BUSPAR) 10 MG tablet Take 10 mg by mouth daily as needed.     Continuous Blood Gluc Receiver (FREESTYLE LIBRE READER) DEVI Use as instructed to check blood sugar with Freestyle Libre 3 sensors 1 each 0   Continuous Blood Gluc Sensor (FREESTYLE LIBRE 3 SENSOR) MISC 1 applicator by Does not apply route every 14 (fourteen) days. 6 each 3   ergocalciferol (VITAMIN D2) 50000 UNITS capsule Take 50,000 Units by mouth every Monday.     ezetimibe (ZETIA) 10 MG tablet Take 10 mg by mouth at bedtime.     hydrALAZINE (APRESOLINE) 100 MG tablet Take 100 mg by mouth 3 (three) times daily.     loratadine (CLARITIN) 10 MG tablet Take 10 mg by mouth daily.     losartan (COZAAR) 100 MG tablet Take 100 mg by mouth daily.     metFORMIN (GLUCOPHAGE-XR) 500 MG 24 hr tablet TAKE 2 TABLETS  BY MOUTH EVERY DAY WITH BREAKFAST 180 tablet 3   nitrofurantoin, macrocrystal-monohydrate, (MACROBID) 100 MG capsule Take 100 mg by mouth every 12 (twelve) hours.     NOVOLOG FLEXPEN 100 UNIT/ML FlexPen INJECT 20-30 UNITS INTO THE SKIN 3 (THREE) TIMES DAILY BEFORE MEALS. 45 mL 3   ondansetron (ZOFRAN ODT) 4 MG disintegrating tablet Take 1 tablet (4 mg total) by mouth every 8 (eight) hours as needed for nausea or vomiting. 30 tablet 5   pantoprazole (PROTONIX) 40 MG tablet Take 1 tablet (40 mg total) by mouth daily. 30 tablet 0   rosuvastatin (CRESTOR) 40 MG tablet Take 40 mg by mouth at bedtime.     TRESIBA FLEXTOUCH 200 UNIT/ML FlexTouch Pen INJECT 30-40 UNITS INTO THE SKIN DAILY 9 mL 1   TRULICITY 3 MG/0.5ML SOPN INJECT 3 MG  INTO THE SKIN ONCE A WEEK. 6 mL 3   No current facility-administered medications on file prior to visit.    ALLERGIES: Allergies  Allergen Reactions   Cefdinir Diarrhea   Cephalexin Rash   Metoprolol Rash   Penicillins Itching and Rash   Sulfa Antibiotics Itching and Rash    FAMILY HISTORY: Family History  Problem Relation Age of Onset   Diabetes Mother    Stroke Sister    Diabetes Sister    Stroke Brother       Objective:  Blood pressure (!) 189/74, pulse 74, height 5\' 5"  (1.651 m), weight 250 lb 12.8 oz (113.8 kg), SpO2 99 %. General: No acute distress.  Patient appears well-groomed.   Head:  Normocephalic/atraumatic Eyes:  Fundi examined but not visualized Neck: supple, no paraspinal tenderness, full range of motion Heart:  Regular rate and rhythm Lungs:  Clear to auscultation bilaterally Back: No paraspinal tenderness Neurological Exam: alert and oriented to person, place, and time.  Speech fluent and not dysarthric, able to repeat, mild inconsistencies following commands.  Inconsistency to double simultaneous tactile stimulation bilaterally.      03/18/2022    6:00 AM  Montreal Cognitive Assessment   Visuospatial/ Executive (0/5) 1  Naming  (0/3) 2  Attention: Read list of digits (0/2) 2  Attention: Read list of letters (0/1) 0  Attention: Serial 7 subtraction starting at 100 (0/3) 0  Language: Repeat phrase (0/2) 1  Language : Fluency (0/1) 0  Abstraction (0/2) 1  Delayed Recall (0/5) 2  Orientation (0/6) 6  Total 15  Adjusted Score (based on education) 16   CN II-XII intact. Bulk and tone normal, muscle strength 5/5 throughout.  Sensation to light touch intact.  Deep tendon reflexes 2+ throughout, toes downgoing.  Finger to nose testing intact.  Gait normal, Romberg negative.   14/04/2021, DO  CC: Shon Millet, MD

## 2022-03-17 ENCOUNTER — Ambulatory Visit: Payer: Medicare Other | Admitting: Neurology

## 2022-03-17 VITALS — BP 189/74 | HR 74 | Ht 65.0 in | Wt 250.8 lb

## 2022-03-17 DIAGNOSIS — R419 Unspecified symptoms and signs involving cognitive functions and awareness: Secondary | ICD-10-CM | POA: Diagnosis not present

## 2022-03-17 DIAGNOSIS — E119 Type 2 diabetes mellitus without complications: Secondary | ICD-10-CM

## 2022-03-17 DIAGNOSIS — I63512 Cerebral infarction due to unspecified occlusion or stenosis of left middle cerebral artery: Secondary | ICD-10-CM | POA: Diagnosis not present

## 2022-03-17 DIAGNOSIS — I10 Essential (primary) hypertension: Secondary | ICD-10-CM

## 2022-03-17 DIAGNOSIS — G4733 Obstructive sleep apnea (adult) (pediatric): Secondary | ICD-10-CM

## 2022-03-17 NOTE — Patient Instructions (Signed)
Will get another MRI of brain Will check neuropsychological evaluation Will keep you out of work until follow up (3 months) Will refer you again to Dr. Corliss Skains

## 2022-03-17 NOTE — Progress Notes (Signed)
MRI Brain W/O Contrast PA started form faxed [employer] [phone]

## 2022-03-18 ENCOUNTER — Encounter: Payer: Self-pay | Admitting: Neurology

## 2022-03-22 ENCOUNTER — Ambulatory Visit: Payer: Medicare Other | Admitting: Internal Medicine

## 2022-03-29 NOTE — Progress Notes (Signed)
Spoke to [employer], [place] has to go through [employer]. Fax form to [employer] for [phone] code MRI [facility] W/WO contrast

## 2022-03-31 NOTE — Progress Notes (Signed)
Imaging - [facility] [address] [place], [place] [address]  Appt 12/21 at 8:30 am

## 2022-04-08 ENCOUNTER — Telehealth: Payer: Self-pay | Admitting: Neurology

## 2022-04-08 NOTE — Telephone Encounter (Signed)
Patient advised of Mri Brain results.

## 2022-04-08 NOTE — Telephone Encounter (Signed)
MRI of brain is stable.  No new stroke or other new findings when compared to the prior one.

## 2022-05-03 ENCOUNTER — Other Ambulatory Visit: Payer: Self-pay | Admitting: Internal Medicine

## 2022-05-12 ENCOUNTER — Ambulatory Visit: Payer: Medicare Other | Admitting: Internal Medicine

## 2022-05-12 ENCOUNTER — Encounter: Payer: Self-pay | Admitting: Internal Medicine

## 2022-05-12 VITALS — BP 128/70 | HR 82 | Ht 65.0 in | Wt 254.6 lb

## 2022-05-12 DIAGNOSIS — E669 Obesity, unspecified: Secondary | ICD-10-CM

## 2022-05-12 DIAGNOSIS — E782 Mixed hyperlipidemia: Secondary | ICD-10-CM | POA: Diagnosis not present

## 2022-05-12 DIAGNOSIS — E1142 Type 2 diabetes mellitus with diabetic polyneuropathy: Secondary | ICD-10-CM | POA: Diagnosis not present

## 2022-05-12 DIAGNOSIS — E1165 Type 2 diabetes mellitus with hyperglycemia: Secondary | ICD-10-CM

## 2022-05-12 LAB — POCT GLYCOSYLATED HEMOGLOBIN (HGB A1C): Hemoglobin A1C: 8.8 % — AB (ref 4.0–5.6)

## 2022-05-12 MED ORDER — INSULIN LISPRO (1 UNIT DIAL) 100 UNIT/ML (KWIKPEN): 20.0000 [IU] | PEN_INJECTOR | Freq: Three times a day (TID) | SUBCUTANEOUS | 1 refills | Status: DC

## 2022-05-12 NOTE — Patient Instructions (Addendum)
Please continue: - Metformin ER 1000 mg with breakfast and lunch - Trulicity 3 mg weekly - Tresiba 42 units daily  Use: - Novolog 20-25 units depending upon the size of the meal  Please return in 3-4 months.

## 2022-05-12 NOTE — Progress Notes (Signed)
Patient ID: Emily Leon, female   DOB: 04-05-1951, 72 y.o.   MRN: [id number]   HPI: Emily Leon is a 72 y.o.-year-old female, initially referred by her PCP, Dr. Celine Leon, returning for follow-up for DM2, dx at 72 y/o, insulin-dependent, uncontrolled, with long-term complications (CKD, PN, fatty liver).  She previously saw Dr. Fransico Leon, last visit with Leon 02/19/2020.  Last visit 4 months ago.  Interim history: No increased urination, + blurry vision, no chest pain. She has diarrhea if she eats early in am. Before last visit, she stopped sweet drinks.  Sugars improved and she started to feel better.  I also advised her to stop the [employer] coffee >> she did stop this and other sweet drinks recently-sugars markedly improved. Before last visit, she had a stroke.  She feels poorly >> nausea, dizziness. Meclizine did not help. She sees neurology. She still has dizziness. She had a UTI >> just started Ciprofloxacin yesterday.  Reviewed HbA1c levels: Lab Results  Component Value Date   HGBA1C 10.6 (A) 12/15/2021   HGBA1C 9.5 (A) 08/12/2021   HGBA1C 10.8 (A) 05/11/2021   HGBA1C 10.1 (A) 02/03/2021   HGBA1C 9.8 (A) 08/03/2020   HGBA1C 10.7 (A) 04/30/2020   HGBA1C 11.1 (A) 02/19/2020   HGBA1C 9.7 (A) 11/20/2019   HGBA1C 9.1 (A) 04/25/2019   HGBA1C 8.8 11/22/2018   Previously on: - Metformin ER 1000 mg with breakfast and lunch - Trulicity 1.5 mg weekly - U500 insulin 60-60-40 units 3x a day started 2 mo >> now 60 units before B b/c 40s and 50s Prev. On Novolog and [place].  Then to: - Metformin ER 1000 mg with breakfast and lunch - Trulicity 3 mg weekly - Tresiba 36 >> 42 units daily  - Novolog 20-22-26 >> 24-28 units >>  - 20 units before b'fast  - 25 units before lunch - 20 units before dinner  She checks her sugars more than 4 times a day with her freestyle libre CGM:  Prev.    Previously   Lowest sugar was 40 (at night - ? Why) >> 70 >> 60 >> 50s >> 50; she has  hypoglycemia awareness at 72.  Highest sugar was  500 >> .Marland KitchenMarland Kitchen 300s >> 300s >> 200s.  Pt's meals are: - 6 am: [employer] coffee - Breakfast: 8 am: egg, toast, fruit >> cereals, bananas - Lunch: salad and fruit - Dinner: baked chicken, salad, fruit - Snacks: -  -+ Mild CKD, last BUN/creatinine:  12/06/2021:  Ref Range & Units   Glucose 70 - 99 mg/dL 948 High    BUN 8 - 27 mg/dL 19   Creatinine 5.46 - 1.00 mg/dL 2.70 High    eGFR CKD-EPI CR 2021 >59 mL/min/1.73 27 Low    BUN/Creatinine Ratio 12 - 28 10 Low    Sodium 134 - 144 mmol/L 140   Potassium 3.5 - 5.2 mmol/L 3.9   Chloride 96 - 106 mmol/L 100   Bicarbonate (CO2) 20 - 29 mmol/L 23   Calcium 8.7 - 10.3 mg/dL 9.6   Phosphorus 3.0 - 4.3 mg/dL 3.1   Albumin 3.9 - 4.9 g/dL 4.0    3/50/0938: 18/2.99, GFR 34; protein to creatinine ratio 1639 Lab Results  Component Value Date   BUN 21 02/03/2021   BUN 13 10/15/2019   CREATININE 1.55 (H) 02/03/2021   CREATININE 1.0 10/15/2019  On valsartan 160 mg daily.  -+ HL; last set of lipids: Lab Results  Component Value Date   CHOL 184 02/03/2021  HDL 38.80 (L) 02/03/2021   LDLCALC 116 (H) 02/03/2021   TRIG 142.0 02/03/2021   CHOLHDL 5 02/03/2021  09/24/2020 (in [facility] ED with sinusitis): Glu 281, serum osm 301 (275-300), BUN/creatinine 12/1.4, GFR 41, CO2 29 09/09/2020: 20/1.55, GFR 40, glucose 270 06/02/2020: Glu 352, BUN/Cr 46/1.71, GFR 35 On Zetia-simvastatin 10-40 mg daily.  - last eye exam was in 01/2022: No DR reportedly, + macular edema. Has a retina specialist in [place].   - + numbness and tingling in her feet. On B12 and Benfotiamine.  She sees podiatry.  Last foot exam 07/23/2021.  Pt has FH of DM in sister and unclear in mother  She also has a history of sleeve gastrectomy 2016, OSA-on CPAP.  ROS: + See HPI + Joint aches  I reviewed pt's medications, allergies, PMH, social hx, family hx, and changes were documented in the history of present illness. Otherwise,  unchanged from my initial visit note.  Past Medical History:  Diagnosis Date   Arthritis    Bronchitis    hx of    Cataracts, bilateral    Chronic kidney disease    Cough    Diabetes mellitus without complication (HCC)    Hypertension    Shortness of breath dyspnea    walking distances or climbing stairs   Sleep apnea    uses CPAP   Tingling    toes bilat    Urinary frequency    Past Surgical History:  Procedure Laterality Date   ABDOMINAL HYSTERECTOMY     BREATH TEK H PYLORI N/A 08/26/2014   Procedure: BREATH TEK H PYLORI;  Surgeon: Greer Pickerel, MD;  Location: [facility];  Service: General;  Laterality: N/A;   KNEE SURGERY Left 2009   LAPAROSCOPIC GASTRIC SLEEVE RESECTION N/A 11/11/2014   Procedure: LAPAROSCOPIC GASTRIC SLEEVE RESECTION;  Surgeon: Greer Pickerel, MD;  Location: [facility];  Service: General;  Laterality: N/A;   UPPER GI ENDOSCOPY  11/11/2014   Procedure: UPPER GI ENDOSCOPY;  Surgeon: Greer Pickerel, MD;  Location: [facility];  Service: General;;   Social History   Socioeconomic History   Marital status: Married    Spouse name: Not on file   Number of children: 1   Years of education: Not on file   Highest education level: Not on file  Occupational History   Occupation: Retired  Tobacco Use   Smoking status: Never   Smokeless tobacco: Never  Vaping Use   Vaping Use: Never used  Substance and Sexual Activity   Alcohol use: No   Drug use: No   Sexual activity: Not on file  Other Topics Concern   Not on file  Social History Narrative   Not on file   Social Determinants of Health   Financial Resource Strain: Not on file  Food Insecurity: Not on file  Transportation Needs: Not on file  Physical Activity: Not on file  Stress: Not on file  Social Connections: Not on file  Intimate Partner Violence: Not on file   Current Outpatient Medications on File Prior to Visit  Medication Sig Dispense Refill   amLODipine (NORVASC) 10 MG tablet Take 10 mg by mouth  every morning.     aspirin 81 MG chewable tablet Chew 81 mg by mouth daily.     atenolol (TENORMIN) 100 MG tablet Take 100 mg by mouth every morning.      B-D UF III MINI PEN NEEDLES 31G X 5 MM MISC USE 4 TIMES DAILY AS DIRECTED 400 each 1  BRILINTA 90 MG TABS tablet Take 90 mg by mouth 2 (two) times daily.     busPIRone (BUSPAR) 10 MG tablet Take 10 mg by mouth daily as needed.     Cholecalciferol (VITAMIN D-3) 25 MCG (1000 UT) CAPS Take by mouth.     Continuous Blood Gluc Receiver (FREESTYLE LIBRE READER) DEVI Use as instructed to check blood sugar with Freestyle Libre 3 sensors 1 each 0   Continuous Blood Gluc Sensor (FREESTYLE LIBRE 3 SENSOR) MISC 1 applicator by Does not apply route every 14 (fourteen) days. 6 each 3   ezetimibe (ZETIA) 10 MG tablet Take 10 mg by mouth at bedtime.     hydrALAZINE (APRESOLINE) 100 MG tablet Take 100 mg by mouth 3 (three) times daily.     insulin degludec (TRESIBA FLEXTOUCH) 200 UNIT/ML FlexTouch Pen INJECT 30-40 UNITS INTO THE SKIN DAILY 9 mL 0   loratadine (CLARITIN) 10 MG tablet Take 10 mg by mouth daily.     losartan (COZAAR) 100 MG tablet Take 100 mg by mouth daily.     metFORMIN (GLUCOPHAGE-XR) 500 MG 24 hr tablet TAKE 2 TABLETS BY MOUTH EVERY DAY WITH BREAKFAST 180 tablet 3   nitrofurantoin, macrocrystal-monohydrate, (MACROBID) 100 MG capsule Take 100 mg by mouth every 12 (twelve) hours.     NOVOLOG FLEXPEN 100 UNIT/ML FlexPen INJECT 20-30 UNITS INTO THE SKIN 3 (THREE) TIMES DAILY BEFORE MEALS. 45 mL 3   ondansetron (ZOFRAN ODT) 4 MG disintegrating tablet Take 1 tablet (4 mg total) by mouth every 8 (eight) hours as needed for nausea or vomiting. 30 tablet 5   rosuvastatin (CRESTOR) 40 MG tablet Take 40 mg by mouth at bedtime.     TRULICITY 3 MG/0.5ML SOPN INJECT 3 MG INTO THE SKIN ONCE A WEEK. 6 mL 3   No current facility-administered medications on file prior to visit.   Allergies  Allergen Reactions   Cefdinir Diarrhea   Cephalexin Rash    Metoprolol Rash   Penicillins Itching and Rash   Sulfa Antibiotics Itching and Rash   Family History  Problem Relation Age of Onset   Diabetes Mother    Stroke Sister    Diabetes Sister    Stroke Brother    PE: BP 128/70 (BP Location: Right Arm, Patient Position: Sitting, Cuff Size: Normal)   Pulse 82   Ht 5\' 5"  (1.651 m)   Wt 254 lb 9.6 oz (115.5 kg)   SpO2 98%   BMI 42.37 kg/m  Wt Readings from Last 3 Encounters:  05/12/22 254 lb 9.6 oz (115.5 kg)  03/17/22 250 lb 12.8 oz (113.8 kg)  12/15/21 254 lb (115.2 kg)   Constitutional: overweight, in NAD Eyes: no exophthalmos ENT: moist mucous membranes, no masses palpated in neck, no cervical lymphadenopathy Cardiovascular: RRR, No MRG Respiratory: CTA B Musculoskeletal: no deformities Skin: moist, warm, no rashes Neurological: no tremor with outstretched hands  ASSESSMENT: 1. DM2, insulin-dependent, uncontrolled, with long-term complications - mild CKD - fatty liver - PN  2. HL  3.  Obesity class II  PLAN:  1. Patient with longstanding, uncontrolled, insulin resistant type 2 diabetes, on basal bolus insulin regimen, metformin, and GLP-1 receptor agonist, with still poor control.  At last visit, reviewing her diet, she was drinking coffee with syrup and I strongly advised her to stop this.  She did in the last 2 weeks and she also eliminated other sweet drinks.  Sugars improved dramatically. CGM interpretation: -At today's visit, we reviewed her CGM downloads: It  appears that 70% of values are in target range (goal >70%), while 28% are higher than 180 (goal <25%), and 2% are lower than 70 (goal <4%).  The calculated average blood sugar is 158.  The projected HbA1c for the next 3 months (GMI) is 7.1%. -Reviewing the CGM trends, sugars appear to be improving overnight and then increasing after the first meal of the day and less so after the last meal of the day.  Overall, however, there is drastic improvement in the blood  sugars since last visit.  Upon questioning, this is most likely related to her improving diet by eliminating sweet drinks.  I strongly advised her to continue. We will continue the same doses of metformin, Trulicity, and [place], but I did advise that she may need a higher dose of NovoLog before breakfast. We discussed about having the dose of NovoLog based on the size of the meal, between 20 and 25 units. -At today's visit, which need to change NovoLog to another insulin, since this is not covered by her insurance anymore.  We sent a prescription for Humalog to her pharmacy. - I suggested to:  Patient Instructions  Please continue: - Metformin ER 1000 mg with breakfast and lunch - Trulicity 3 mg weekly - Tresiba 42 units daily  Use: - Novolog 20-25 units depending upon the size of the meal  Please return in 3-4 months.  - we checked her HbA1c: 8.8% (much better) - advised to check sugars at different times of the day - 4x a day, rotating check times - advised for yearly eye exams >> she is UTD - return to clinic in 3-4 months  2. HL -Reviewed latest lipid panel from 01/2021: LDL above our target of less than 70, HDL slightly low: Lab Results  Component Value Date   CHOL 184 02/03/2021   HDL 38.80 (L) 02/03/2021   LDLCALC 116 (H) 02/03/2021   TRIG 142.0 02/03/2021   CHOLHDL 5 02/03/2021  -She continues on simvastatin 40 mg daily and Zetia 10 mg daily without side effects - she had LIpid check  2-3 weeks ago by PCP >> will need records - [place] [facility]  3. Obesity class II - will continue the GLP-1 receptor agonist which should also help with weight loss -At last visit, I again strongly advised her to stop the [employer] coffee. -Since last visit, weight is stable.  Emily Kingdom, MD PhD [facility] Endocrinology

## 2022-05-22 ENCOUNTER — Other Ambulatory Visit: Payer: Self-pay | Admitting: Internal Medicine

## 2022-05-22 DIAGNOSIS — E1142 Type 2 diabetes mellitus with diabetic polyneuropathy: Secondary | ICD-10-CM

## 2022-05-24 DIAGNOSIS — R300 Dysuria: Secondary | ICD-10-CM | POA: Insufficient documentation

## 2022-05-24 HISTORY — DX: Dysuria: R30.0

## 2022-05-25 ENCOUNTER — Telehealth: Payer: Self-pay

## 2022-05-25 DIAGNOSIS — E1165 Type 2 diabetes mellitus with hyperglycemia: Secondary | ICD-10-CM

## 2022-05-25 MED ORDER — FIASP FLEXTOUCH 100 UNIT/ML ~~LOC~~ SOPN: 20.0000 [IU] | PEN_INJECTOR | Freq: Three times a day (TID) | SUBCUTANEOUS | 1 refills | Status: DC

## 2022-05-25 NOTE — Telephone Encounter (Signed)
Fiasp -same doses!  Please make sure that she is injecting at the start of the meal, and not 15 minutes before.

## 2022-05-25 NOTE — Telephone Encounter (Signed)
Rx sent 

## 2022-05-25 NOTE — Telephone Encounter (Signed)
Insurance is no longer covering Novolog insulin and the covered alternatives are Psychiatric nurse.

## 2022-05-26 ENCOUNTER — Encounter: Payer: Self-pay | Admitting: Psychology

## 2022-05-26 DIAGNOSIS — J3489 Other specified disorders of nose and nasal sinuses: Secondary | ICD-10-CM | POA: Insufficient documentation

## 2022-05-26 DIAGNOSIS — J309 Allergic rhinitis, unspecified: Secondary | ICD-10-CM | POA: Insufficient documentation

## 2022-05-26 DIAGNOSIS — M159 Polyosteoarthritis, unspecified: Secondary | ICD-10-CM | POA: Insufficient documentation

## 2022-05-26 DIAGNOSIS — M810 Age-related osteoporosis without current pathological fracture: Secondary | ICD-10-CM | POA: Insufficient documentation

## 2022-05-26 DIAGNOSIS — E559 Vitamin D deficiency, unspecified: Secondary | ICD-10-CM | POA: Insufficient documentation

## 2022-05-26 DIAGNOSIS — R059 Cough, unspecified: Secondary | ICD-10-CM | POA: Insufficient documentation

## 2022-05-26 DIAGNOSIS — Z9181 History of falling: Secondary | ICD-10-CM | POA: Insufficient documentation

## 2022-05-26 DIAGNOSIS — K117 Disturbances of salivary secretion: Secondary | ICD-10-CM

## 2022-05-26 HISTORY — DX: Disturbances of salivary secretion: K11.7

## 2022-05-27 ENCOUNTER — Ambulatory Visit: Payer: Medicare Other

## 2022-05-27 ENCOUNTER — Encounter: Payer: Self-pay | Admitting: Psychology

## 2022-05-27 ENCOUNTER — Ambulatory Visit: Payer: Medicare Other | Admitting: Psychology

## 2022-05-27 DIAGNOSIS — F01A0 Vascular dementia, mild, without behavioral disturbance, psychotic disturbance, mood disturbance, and anxiety: Secondary | ICD-10-CM

## 2022-05-27 DIAGNOSIS — I63512 Cerebral infarction due to unspecified occlusion or stenosis of left middle cerebral artery: Secondary | ICD-10-CM | POA: Diagnosis not present

## 2022-05-27 DIAGNOSIS — R4189 Other symptoms and signs involving cognitive functions and awareness: Secondary | ICD-10-CM

## 2022-05-27 HISTORY — DX: Vascular dementia, mild, without behavioral disturbance, psychotic disturbance, mood disturbance, and anxiety: F01.A0

## 2022-05-27 NOTE — Progress Notes (Signed)
NEUROPSYCHOLOGICAL EVALUATION [place]. [place] Department of Neurology  Date of Evaluation: May 27, 2022  Reason for Referral:   Emily Leon is a 72 y.o. right-handed African-American female referred by Metta Clines, D.O., to characterize her current cognitive functioning and assist with diagnostic clarity and treatment planning in the context of subjective cognitive decline and prior left frontoparietal infarct.   Assessment and Plan:   Clinical Impression(s): Emily Leon's pattern of performance is suggestive of fairly diffuse cognitive impairment. Primary domains include surrounding processing speed, attention/concentration, executive functioning, phonemic fluency, confrontation naming, and essentially all aspects of learning and memory. Visuospatial abilities were somewhat variable but overall exhibited impairment as well. Semantic fluency represented her sole area of intact performance relative to age-matched peers. Functionally, Emily Leon has not driven since her July 2023 stroke and her son did describe some trouble with medication management (e.g., her exhibiting confusion when trying to differentiate Tramadol from Trazodone when these medications were previously prescribed). Diagnostically, she is likely on the border between mild and major neurocognitive disorder classifications. 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  There would appear a strong vascular component to her current presentation and this representing the primary cause for dysfunction remains a possibility. 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 This is especially true of language dysfunction, executive dysfunction, and poor visuospatial abilities. Outside of  this, Emily Leon also has several medical ailments which can directly impact cerebrovascular functioning (e.g., hypertension, hyperlipidemia, hypoxemia, poorly managed type II diabetes, variably treated sleep apnea), as well as other ailments which can also impact cognitive functioning (chronic kidney disease, vitamin D deficiency). Deficits in processing speed, attention/concentration, and encoding/retrieval aspects of memory (in addition to the abilities directly impacted by her stroke) are also very reasonable findings. 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  Given fairly diffuse impairment, it is difficult to rule in or out an underlying neurodegenerative illness in addition to what has been caused by her stroke and medical history. Of particular concern is her pattern of poor delayed retrieval across memory measures (retention rates ranged from 33% to 50%), coupled with generally very poor recognition performances. This would suggest a greater storage impairment, potentially above and beyond what would be caused by her stroke. Also noteworthy is evidence for rapid forgetting in her day-to-day life. For example, at the start of the current appointment, Emily Leon stated that she was fully unaware of the purpose or reason for the current evaluation or what she was coming in for. This is despite the psychometrist calling her the day before for a typical reminder telephone call and providing a very in-depth explanation of the appointment and what it entails (as Emily Leon reportedly asked many questions). Emily Leon also reportedly stated several times during testing that she was unclear of the purpose or reason for testing, even after being provided the explanation again 30-45 minutes earlier by myself. Rapid forgetting and an evolving storage impairment are hallmark characteristics of Alzheimer's disease. Intact semantic fluency is encouraging in this regard. Overall,  while this illness cannot be ruled out, it is difficult to more firmly rule it in given the location of her stroke and the reasonable nature that current deficits could be better explained by that event.  Continued medical monitoring will be important moving forward.   Recommendations: If desired, Emily Leon could discuss medications aimed to address memory loss and concerns surrounding progressive cognitive decline with Dr. Tomi Likens. It is important to highlight that these medications have been shown to slow functional decline in some individuals. There is no current treatment which can stop or reverse cognitive decline when caused by a neurodegenerative illness.   I fully agree with the recommendations made previously by her medical team and feel that, based upon current cognitive impairment, Emily Leon should fully abstain from all driving behaviors. Should her family wish to pursue a formalized driving evaluation, they could reach out to the following agencies: [employer] in [place]: [phone] Driver [facility]habil[facility] Services: [facility]: [place]: [phone] or [phone]  Should there be further progression of current deficits over time, she is unlikely to regain any independent living skills lost. Therefore, it is recommended that she remain as involved as possible in all aspects of household chores, finances, and medication management, with supervision to ensure adequate performance. She will likely benefit from the establishment and maintenance of a routine in order to maximize functional abilities over time.  It will be important for Emily Leon to have another person with her when in situations where she may need to process information, weigh the pros and cons of different options, and make decisions, in order to ensure that she fully understands and recalls all information to be considered.  Emily Leon is encouraged to attend to  lifestyle factors for brain health (e.g., regular physical exercise, good nutrition habits, regular participation in cognitively-stimulating activities, and general stress management techniques), which are likely to have benefits for both emotional adjustment and cognition. Optimal control of vascular risk factors (including safe cardiovascular exercise and adherence to dietary recommendations) is encouraged. Likewise, continued compliance with her CPAP machine will also be important. Continued participation in activities which provide mental stimulation and social interaction is also recommended.   Important information should be provided to Ms. Nakatani in written format in all instances. This information should be placed in a highly frequented and easily visible location within her home to promote recall. External strategies such as written notes in a consistently used memory journal, visual and nonverbal auditory cues such as a calendar on the refrigerator or appointments with alarm, such as on a cell phone, can also help maximize recall.  To address problems with processing speed, she may wish to consider:   -Ensuring that she is alerted when essential material or instructions are being presented   -Adjusting the speed at which new information is presented   -Allowing for more time in comprehending, processing, and responding in conversation  To address problems with fluctuating attention and executive functioning, she may wish to consider:   -Avoiding external distractions when needing to concentrate   -Limiting exposure to fast paced environments with multiple sensory demands   -Writing down complicated information and using checklists   -Attempting and completing one task at a time (i.e., no multi-tasking)   -Verbalizing aloud each step of a task to maintain focus   -Taking frequent breaks during the completion of steps/tasks to avoid fatigue   -Reducing the amount of information considered at  one time  Review of Records:   Ms. Schmiesing was most recently seen by [facility] Metta Clines, D.O.) on 03/17/2022 for follow-up. Briefly, around 10/30/2021, she began experiencing intermittent episodes of slurred speech with word-finding difficulty, each lasting less than  10 minutes. She also endorsed mild intermittent headaches. No facial droop, vision loss, or unilateral numbness or weakness was reported. Two days later, she presented to [facility] in [place] where a head CT showed no acute intracranial abnormality. However, a head and neck CTA showed severe stenosis versus partially occlusive thrombus of the left MCA involving the distal left M1 segment/left MCA bifurcation. She was outside the window for tPA or endovascular intervention and was admitted for stroke workup. Brain MRI revealed acute infarcts within the left frontoparietal lobes and left centrum semiovale. 2D echocardiogram showed EF 60-65% with grade 1 diastolic dysfunction with severely increased left ventricular wall thickness but without thrombus, significant valvulopathy, or interatrial shunt. LDL was 82 and Hgb A1c was 10.5. Given recurrent transient symptoms, she had an EEG to rule out possible seizure activity which was normal. Prior to admission, she was not on antiplatelet therapy.  She was discharged on both ASA 77m and Plavix 738mdaily.   She was readmitted on 11/06/2021 for acute dysarthria and dizziness. Head CT showed no acute intracranial abnormality. Brain MRI was negative for a new infarct. As reported dizziness was related to Plavix, she was switched to Brilinta. Following discharge, she experienced significant anxiety and dizziness. The later was described as fuzzy vision, not lightheadedness or vertigo. On 11/16/2021, she was seen for difficulty breathing. On 11/24/2021 she was evaluated for generalized weakness. On 12/09/2021 she was seen again for ongoing dizziness and blurred vision. Head CT revealed no acute  findings.  When meeting with Dr. JaTomi Likensn 03/17/2022, she reported mild symptoms of aphasia and was exhibiting inconsistent tactile hemineglect bilaterally. Clock drawing suggested left sided hemineglect, which would be an unexpected finding given the location of her prior stroke. A new brain MRI was ordered due to concerns surrounding an new infarct. This scan was stable relative to previous scans. Performance on a brief cognitive screening instrument (MOCA) was 16/30. Ultimately, Ms. PoAgateas referred for a comprehensive neuropsychological evaluation to characterize her cognitive abilities and to assist with diagnostic clarity and treatment planning.   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   Chronic dyspnea 12/28/2021   Cough    Dysuria 05/24/2022   Fatty liver disease, nonalcoholic [id number] Generalized anxiety disorder 12/02/2021   Hypoxemia 05/29/2020   Insomnia 11/23/2021   Mixed hyperlipidemia 03/02/2015   Morbid obesity with BMI of 40.0-44.9, adult 12/16/2016   Obstructive sleep apnea 11/13/2014   Osteoarthrosis involving multiple sites    Pneumonia due to COVID-19 virus 05/29/2020   Poorly controlled type 2 diabetes mellitus with peripheral neuropathy 03/02/2015   Posterior rhinorrhea    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   Senile osteoporosis    Stage 3b chronic kidney disease 09/02/2021   Tingling    toes bilaterally   Urinary frequency    Vitamin D deficiency    Xerostomia 05/26/2022    Past Surgical History:  Procedure Laterality Date   ABDOMINAL HYSTERECTOMY     BREATH TEK H PYLORI N/A 08/26/2014   Procedure: BREATH TEK H PYLORI;  Surgeon: ErGreer PickerelMD;  [facility];  Service: General;  Laterality: N/A;   KNEE SURGERY Left 2009   LAPAROSCOPIC GASTRIC SLEEVE  RESECTION N/A 11/11/2014   Procedure: LAPAROSCOPIC GASTRIC SLEEVE RESECTION;  Surgeon: Greer Pickerel, MD;  Location: [facility];  Service: General;  Laterality: N/A;   UPPER GI ENDOSCOPY  11/11/2014   Procedure: UPPER GI ENDOSCOPY;  Surgeon: Greer Pickerel, MD;  Location: [facility];  Service: General;;    Current Outpatient Medications:    amLODipine (NORVASC) 10 MG tablet, Take 10 mg by mouth every morning., Disp: , Rfl:    aspirin 81 MG chewable tablet, Chew 81 mg by mouth daily., Disp: , Rfl:    atenolol (TENORMIN) 100 MG tablet, Take 100 mg by mouth every morning. , Disp: , Rfl:    B-D UF III MINI PEN NEEDLES 31G X 5 MM MISC, USE 4 TIMES DAILY AS DIRECTED, Disp: 400 each, Rfl: 1   BRILINTA 90 MG TABS tablet, Take 90 mg by mouth 2 (two) times daily., Disp: , Rfl:    busPIRone (BUSPAR) 10 MG tablet, Take 10 mg by mouth daily as needed., Disp: , Rfl:    Cholecalciferol (VITAMIN D-3) 25 MCG (1000 UT) CAPS, Take by mouth., Disp: , Rfl:    Continuous Blood Gluc Receiver (FREESTYLE LIBRE READER) DEVI, Use as instructed to check blood sugar with Freestyle Libre 3 sensors, Disp: 1 each, Rfl: 0   Continuous Blood Gluc Sensor (FREESTYLE LIBRE 3 SENSOR) MISC, 1 applicator by Does not apply route every 14 (fourteen) days., Disp: 6 each, Rfl: 3   ezetimibe (ZETIA) 10 MG tablet, Take 10 mg by mouth at bedtime., Disp: , Rfl:    hydrALAZINE (APRESOLINE) 100 MG tablet, Take 100 mg by mouth 3 (three) times daily., Disp: , Rfl:    insulin aspart (FIASP FLEXTOUCH) 100 UNIT/ML FlexTouch Pen, Inject 20-25 Units into the skin with breakfast, with lunch, and with evening meal., Disp: 30 mL, Rfl: 1   insulin degludec (TRESIBA FLEXTOUCH) 200 UNIT/ML FlexTouch Pen, INJECT 30-40 UNITS INTO THE SKIN DAILY, Disp: 9 mL, Rfl: 0   insulin lispro (HUMALOG KWIKPEN) 100 UNIT/ML KwikPen, Inject 20-25 Units into the skin 3 (three) times  daily., Disp: 75 mL, Rfl: 1   loratadine (CLARITIN) 10 MG tablet, Take 10 mg by mouth daily., Disp: , Rfl:    losartan (COZAAR) 100 MG tablet, Take 100 mg by mouth daily., Disp: , Rfl:    metFORMIN (GLUCOPHAGE-XR) 500 MG 24 hr tablet, TAKE 2 TABLETS BY MOUTH EVERY DAY WITH BREAKFAST AND LUNCH, Disp: 360 tablet, Rfl: 3   nitrofurantoin, macrocrystal-monohydrate, (MACROBID) 100 MG capsule, Take 100 mg by mouth every 12 (twelve) hours., Disp: , Rfl:    ondansetron (ZOFRAN ODT) 4 MG disintegrating tablet, Take 1 tablet (4 mg total) by mouth every 8 (eight) hours as needed for nausea or vomiting., Disp: 30 tablet, Rfl: 5   rosuvastatin (CRESTOR) 40 MG tablet, Take 40 mg by mouth at bedtime., Disp: , Rfl:    TRULICITY 3 0000000 SOPN, INJECT 3 MG INTO THE SKIN ONCE A WEEK., Disp: 6 mL, Rfl: 3  Clinical Interview:   The following information was obtained during a clinical interview with Ms. Tsay and her son prior to cognitive testing.  Cognitive Symptoms: Decreased short-term memory: Denied. Decreased long-term memory: Denied. Decreased attention/concentration: Denied. Reduced processing speed: Denied. Difficulties with executive functions: Denied. They also denied trouble with impulsivity or any significant personality changes.  Difficulties with emotion regulation: Denied. Difficulties with receptive language: Denied. Difficulties with word finding: Endorsed. Both she and her son described a decline in expressive language stemming from her July 2023 infarct.  While mild difficulties were still said to present, there has been improvement over the past several months.  Decreased visuoperceptual ability: Denied.  Difficulties completing ADLs: Somewhat. Her son noted some initial difficulties surrounding medication management with Ms. Groneman having some trouble differentiating between Tramadol and Trazodone. However, she was ultimately taken off both of these medications and no current issues were  described. She has not driven since her July 2023 stroke. Prior to this, she denied concerns. Her son noted that Ms. Lague husband had stated that she would commonly drive several mph under the speed limit prior to her stroke.   Additional Medical History: History of traumatic brain injury/concussion: Denied. History of stroke: Endorsed (see above). History of seizure activity: Denied. History of known exposure to toxins: Denied. Symptoms of chronic pain: Endorsed. She reported somewhat diffuse arthritic pain, largely centering on her knees and legs.  Experience of frequent headaches/migraines: Denied. Frequent instances of dizziness/vertigo: Denied. She reported greater symptoms of dizziness around the time of her stroke. Some of these was attributed to medication side effects at that time. This has improved over the past several months and was not described as a current issue.   Sensory changes: She wears glasses with benefit. She reported some increased light sensitivity around the time of her stroke, as well as some taste deficiency (i.e., nothing tastes the same anymore). The former did improve with time while the latter has remained present. She denied trouble with hearing or her sense of smell. Balance/coordination difficulties: Denied. She also denied any recent falls. Other motor difficulties: Denied.  Sleep History: Estimated hours obtained each night: 7-8 hours.  Difficulties falling asleep: Denied. Difficulties staying asleep: Denied. Feels rested and refreshed upon awakening: Endorsed.  History of snoring: Endorsed. History of waking up gasping for air: Endorsed. Witnessed breath cessation while asleep: Endorsed. She acknowledged a history of obstructive sleep apnea. She reported variable CPAP use, often starting the night using this device but taking it off midway through the night.   History of vivid dreaming: Denied. Excessive movement while asleep: Denied. Instances of  acting out her dreams: Denied.  Psychiatric/Behavioral Health History: Depression: She described her current mood as "fine" and denied to her knowledge any prior mental health concerns or diagnoses surrounding depressive symptomatology. Current or remote suicidal ideation, intent, or plan was denied.  Anxiety: She described symptoms of post-stroke anxiety which have been improved lately since starting Lexapro. She denied prominent anxious distress prior to her July 2023 stroke.  Mania: Denied. Trauma History: Denied. Visual/auditory hallucinations: Denied. Delusional thoughts: Denied.  Tobacco: Denied. Alcohol: She denied current alcohol consumption as well as a history of problematic alcohol abuse or dependence.  Recreational drugs: Denied.  Family History: Problem Relation Age of Onset   Diabetes Mother    Stroke Sister    Diabetes Sister    Stroke Brother    This information was confirmed by Ms. Raquel Sarna.  Academic/Vocational History: Highest level of educational attainment: 12 years. She graduated from high school and described herself as an Film/video editor. Math was noted as a relative weakness in academic settings.  History of developmental delay: Denied. History of grade repetition: Denied. Enrollment in special education courses: Denied. History of LD/ADHD: Denied.  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 She has not worked since her event but expressed a desire to return to this position in the future.   Evaluation Results:   Behavioral Observations: Ms. Riolo  was accompanied by her son, arrived to her appointment on time, and was appropriately dressed and groomed. She appeared alert and oriented. Observed gait and station were within normal limits. Gross motor functioning appeared intact upon informal observation and no abnormal movements (e.g., tremors) were noted. Her affect was generally relaxed and positive, but did  range appropriately given the subject being discussed during the clinical interview or the task at hand during testing procedures. Spontaneous speech was fluent and word finding difficulties were not observed during the clinical interview. Thought processes were coherent, organized, and normal in content. Insight into her cognitive difficulties appeared very poor in that she exhibited fairly diffuse impairment despite denying all concerns during interview. However, it is admittedly difficult to determine if Ms. Hausler was simply attempting to portray herself in an overly favorable light.   During very early portions of the interview, Ms. Rezabek was provided the purpose and reasoning for the current evaluation as she described being wholly unaware of what the current evaluation would entail. This is despite speaking with the psychometrist the previous day during a appointment reminder phone call which was quite in-depth given that Ms. Simones had numerous questions. Prior to starting testing, she again raised the same questions she had been provided answers of the day before and 30 minutes before. During testing, visual impairments were quire pronounced and she commonly had trouble losing her place or remaining on a desired line. Sustained attention was appropriate. Task engagement was adequate and she persisted when challenged. She did fatigue as the evaluation progressed. It was mildly abbreviated in response. Overall, Ms. Debruyne was cooperative with the clinical interview and subsequent testing procedures.   Adequacy of Effort: The validity of neuropsychological testing is limited by the extent to which the individual being tested may be assumed to have exerted adequate effort during testing. Ms. Bazurto expressed her intention to perform to the best of her abilities and exhibited adequate task engagement and persistence. Scores across stand-alone and embedded performance validity measures were variable but largely  within expectation. Her below expectation performance is more likely due to true cognitive impairment rather than poor engagement or attempts to perform poorly. As such, the results of the current evaluation are believed to be a valid representation of Ms. Vannest current cognitive functioning.  Test Results: Ms. Neugebauer was largely oriented at the time of the current evaluation.  Intellectual abilities based upon educational and vocational attainment were estimated to be in the below average to average range. Premorbid abilities were estimated to be within the well below average range based upon a single-word reading test.   Processing speed was exceptionally low. Basic attention was well below average to below average. More complex attention (e.g., working memory) was also well below average to below average. Executive functioning was exceptionally low to well below average.  Receptive language abilities were unable to be assessed directly. Ms. Suthard did not exhibit prominent difficulties comprehending task instructions and answered all questions asked of her appropriately during interview. Assessed expressive language was variable. Phonemic fluency was exceptionally low, semantic fluency was below average, and confrontation naming was exceptionally low to well below average.      Assessed visuospatial/visuoconstructional abilities were exceptionally low to well below average. She did exhibit an adequate performance across a basic visual discrimination task. Across her drawing of a clock, she included the number 1 twice, as well as made an additional nondescript hash mark between numbers 6 and 7. She also exhibited confusion and was unable  to make an attempt at placing clock hands. Her copy of a complex figure was notable for numerous fairly significant visual distortions.   Learning (i.e., encoding) of novel verbal information was exceptionally low to below average. Spontaneous delayed recall (i.e.,  retrieval) of previously learned information was also exceptionally low to below average. Retention rates were 50% (raw score of 2) across a story learning task, 33% (raw score of 2) across a list learning task, and 38% across a figure drawing task. Performance across recognition tasks was below expectation, suggesting very limited evidence for information consolidation.   Results of emotional screening instruments suggested that recent symptoms of generalized anxiety were in the moderate range, while symptoms of depression were within normal limits. A screening instrument assessing recent sleep quality suggested the presence of minimal sleep dysfunction.  Tables of Scores:   Note: This summary of test scores accompanies the interpretive report and should not be considered in isolation without reference to the appropriate sections in the text. Descriptors are based on appropriate normative data and may be adjusted based on clinical judgment. Terms such as "Within Normal Limits" and "Outside Normal Limits" are used when a more specific description of the test score cannot be determined.       Percentile - Normative Descriptor > 98 - Exceptionally High 91-97 - Well Above Average 75-90 - Above Average 25-74 - Average 9-24 - Below Average 2-8 - Well Below Average < 2 - Exceptionally Low       Validity:   DESCRIPTOR       Dot Counting Test: --- --- Within Normal Limits  RBANS Effort Index: --- --- Within Normal Limits  WAIS-IV Reliable Digit Span: --- --- Outside Normal Limits       Orientation:      Raw Score Percentile   NAB Orientation, Form 1 28/29 --- ---       Cognitive Screening:      Raw Score Percentile   SLUMS: 16/30 --- ---       RBANS, Form A: Standard Score/ Scaled Score Percentile   Total Score 55 <1 Exceptionally Low  Immediate Memory 69 2 Well Below Average    List Learning 6 9 Below Average    Story Memory 3 1 Exceptionally Low  Visuospatial/Constructional 58 <1  Exceptionally Low    Figure Copy 4 2 Well Below Average    Line Orientation 4/20 <2 Exceptionally Low  Language 79 8 Well Below Average    Picture Naming 8/10 3-9 Well Below Average    Semantic Fluency 7 16 Below Average  Attention 64 1 Exceptionally Low    Digit Span 7 16 Below Average    Coding 2 <1 Exceptionally Low  Delayed Memory 52 <1 Exceptionally Low    List Recall 2/10 10-16 Below Average    List Recognition 13/20 <2 Exceptionally Low    Story Recall 3 1 Exceptionally Low    Story Recognition 4/12 <1 Exceptionally Low    Figure Recall 5 5 Well Below Average    Figure Recognition 4/8 9-20 Below Average        Intellectual Functioning:      Standard Score Percentile   Barona Formula Estimated Premorbid IQ: 89 23 Below Average        Standard Score Percentile   Test of Premorbid Functioning: 73 4 Well Below Average       Attention/Executive Function:     Trail Making Test (TMT): Raw Score (T Score) Percentile     Part  A 92 secs.,  3 errors (27) 1 Exceptionally Low    Part B Discontinued --- Impaired         Scaled Score Percentile   WAIS-IV Digit Span: 3 1 Exceptionally Low    Forward 4 2 Well Below Average    Backward 5 5 Well Below Average    Sequencing 6 9 Below Average       D-KEFS Verbal Fluency Test: Raw Score (Scaled Score) Percentile     Letter Total Correct 11 (3) 1 Exceptionally Low    Category Total Correct 25 (7) 16 Below Average    Category Switching Total Correct 7 (4) 2 Well Below Average    Category Switching Accuracy 6 (5) 5 Well Below Average      Total Set Loss Errors 3 (9) 37 Average      Total Repetition Errors 6 (7) 16 Below Average       Language:     Verbal Fluency Test: Raw Score (T Score) Percentile     Phonemic Fluency (FAS) 11 (29) 2 Exceptionally Low    Animal Fluency 10 (39) 14 Below Average        NAB Language Module, Form 1: T Score Percentile     Naming 19/31 (19) <1 Exceptionally Low       Visuospatial/Visuoconstruction:       Raw Score Percentile   Line Bisection: --- --- Within Normal Limits  Clock Drawing: 3/10 --- Impaired       NAB Spatial Module, Form 1: T Score Percentile     Visual Discrimination 43 25 Average        Scaled Score Percentile   WAIS-IV Block Design: 2 <1 Exceptionally Low       Mood and Personality:      Raw Score Percentile   Geriatric Depression Scale: 8 --- Within Normal Limits  Geriatric Anxiety Scale: 22 --- Moderate    Somatic 8 --- Mild    Cognitive 7 --- Moderate    Affective 7 --- Moderate       Additional Questionnaires:      Raw Score Percentile   PROMIS Sleep Disturbance Questionnaire: 16 --- None to Slight   Informed Consent and Coding/Compliance:   The current evaluation represents a clinical evaluation for the purposes previously outlined by the referral source and is in no way reflective of a forensic evaluation.   Ms. Prestage was provided with a verbal description of the nature and purpose of the present neuropsychological evaluation. Also reviewed were the foreseeable risks and/or discomforts and benefits of the procedure, limits of confidentiality, and mandatory reporting requirements of this provider. The patient was given the opportunity to ask questions and receive answers about the evaluation. Oral consent to participate was provided by the patient.   This evaluation was conducted by Christia Reading, Ph.D., ABPP-CN, board certified clinical neuropsychologist. Ms. Pyka completed a clinical interview with Dr. Melvyn Novas, billed as one unit [phone], and 135 minutes of cognitive testing and scoring, billed as one unit [phone] and four additional units [id number]. Psychometrist Cruzita Lederer, B.S., assisted Dr. Melvyn Novas with test administration and scoring procedures. As a separate and discrete service, Dr. Melvyn Novas spent a total of 160 minutes in interpretation and report writing billed as one unit [phone] and two units [id number].

## 2022-05-27 NOTE — Progress Notes (Signed)
   Psychometrician Note   Cognitive testing was administered to [employer] by Cruzita Lederer, B.S. (psychometrist) under the supervision of Dr. Christia Reading, Ph.D., licensed psychologist on 05/27/2022. Ms. Junio did not appear overtly distressed by the testing session per behavioral observation or responses across self-report questionnaires. Rest breaks were offered.    The battery of tests administered was selected by Dr. Christia Reading, Ph.D. with consideration to Ms. Cerra's current level of functioning, the nature of her symptoms, emotional and behavioral responses during interview, level of literacy, observed level of motivation/effort, and the nature of the referral question. This battery was communicated to the psychometrist. Communication between Dr. Christia Reading, Ph.D. and the psychometrist was ongoing throughout the evaluation and Dr. Christia Reading, Ph.D. was immediately accessible at all times. Dr. Christia Reading, Ph.D. provided supervision to the psychometrist on the date of this service to the extent necessary to assure the quality of all services provided.    KRYSTELLE PRASHAD will return within approximately 1-2 weeks for an interactive feedback session with Dr. Melvyn Novas at which time her test performances, clinical impressions, and treatment recommendations will be reviewed in detail. Ms. Raffo understands she can contact our office should she require our assistance before this time.  A total of 135 minutes of billable time were spent face-to-face with Ms. Chalupa by the psychometrist. This includes both test administration and scoring time. Billing for these services is reflected in the clinical report generated by Dr. Christia Reading, Ph.D.  This note reflects time spent with the psychometrician and does not include test scores or any clinical interpretations made by Dr. Melvyn Novas. The full report will follow in a separate note.

## 2022-06-02 ENCOUNTER — Telehealth: Payer: Self-pay

## 2022-06-02 NOTE — Telephone Encounter (Signed)
Inbound call from DME supplier requesting form be completed and faxed with clinical notes. DME supplies ordered via [employer] through online portal.

## 2022-06-03 ENCOUNTER — Ambulatory Visit (INDEPENDENT_AMBULATORY_CARE_PROVIDER_SITE_OTHER): Payer: Medicare Other | Admitting: Psychology

## 2022-06-03 ENCOUNTER — Encounter: Payer: Medicare Other | Admitting: Psychology

## 2022-06-03 DIAGNOSIS — I63512 Cerebral infarction due to unspecified occlusion or stenosis of left middle cerebral artery: Secondary | ICD-10-CM

## 2022-06-03 DIAGNOSIS — F01A0 Vascular dementia, mild, without behavioral disturbance, psychotic disturbance, mood disturbance, and anxiety: Secondary | ICD-10-CM

## 2022-06-03 NOTE — Progress Notes (Signed)
   Neuropsychology Feedback Session Tillie Rung. [place] [facility]     A phone call was made to Ms. Berman at 10:00 on 06/03/22 to conduct her feedback appointment as previously scheduled. A voicemail was left and she did not return this call. She was encouraged to call the office should she wish to reschedule this appointment. Her report was mailed to her address on file.

## 2022-06-09 ENCOUNTER — Other Ambulatory Visit: Payer: Self-pay | Admitting: Internal Medicine

## 2022-06-15 DIAGNOSIS — R809 Proteinuria, unspecified: Secondary | ICD-10-CM | POA: Insufficient documentation

## 2022-06-15 HISTORY — DX: Proteinuria, unspecified: R80.9

## 2022-06-17 ENCOUNTER — Encounter: Payer: Self-pay | Admitting: Psychology

## 2022-06-17 ENCOUNTER — Ambulatory Visit (INDEPENDENT_AMBULATORY_CARE_PROVIDER_SITE_OTHER): Payer: Medicare Other | Admitting: Psychology

## 2022-06-17 DIAGNOSIS — F01A0 Vascular dementia, mild, without behavioral disturbance, psychotic disturbance, mood disturbance, and anxiety: Secondary | ICD-10-CM

## 2022-06-17 DIAGNOSIS — R41842 Visuospatial deficit: Secondary | ICD-10-CM | POA: Diagnosis not present

## 2022-06-17 DIAGNOSIS — I63512 Cerebral infarction due to unspecified occlusion or stenosis of left middle cerebral artery: Secondary | ICD-10-CM | POA: Diagnosis not present

## 2022-06-17 NOTE — Progress Notes (Signed)
   Neuropsychology Feedback Session Emily Leon. [place] [facility] Neurology  Reason for Referral:   Emily Leon is a 72 y.o. right-handed African-American female referred by Metta Clines, D.O., to characterize her current cognitive functioning and assist with diagnostic clarity and treatment planning in the context of subjective cognitive decline and prior left frontoparietal infarct.   Feedback:   Emily Leon completed a comprehensive neuropsychological evaluation on 05/27/2022. Please refer to that encounter for the full report and recommendations. Briefly, results suggested fairly diffuse cognitive impairment. Primary domains include surrounding processing speed, attention/concentration, executive functioning, phonemic fluency, confrontation naming, and essentially all aspects of learning and memory. Visuospatial abilities were somewhat variable but overall exhibited impairment as well. There would appear a strong vascular component to her current presentation and this representing the primary cause for dysfunction remains a possibility. 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 This is especially true of language dysfunction, executive dysfunction, and poor visuospatial abilities. Outside of this, Emily Leon also has several medical ailments which can directly impact cerebrovascular functioning (e.g., hypertension, hyperlipidemia, hypoxemia, poorly managed type II diabetes, variably treated sleep apnea), as well as other ailments which can also impact cognitive functioning (chronic kidney disease, vitamin D deficiency). Deficits in processing speed, attention/concentration, and encoding/retrieval aspects of memory (in addition to the abilities directly impacted by her stroke) are also very reasonable findings. As such, she might be best characterized as having a mild vascular  dementia presentation.   Emily Leon was accompanied by her son during the current feedback session. Content of the current session focused on the results of her neuropsychological evaluation. Emily Leon was given the opportunity to ask questions and her questions were answered. She was encouraged to reach out should additional questions arise. A copy of her report was provided at the conclusion of the visit.      Greater than 31 minutes were spent preparing for, conducting, and documenting the current feedback session with Emily Leon, billed as one unit [phone].

## 2022-07-06 NOTE — Progress Notes (Signed)
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   Other secondary stroke prevention management as per PCP: Continue DAPT (ASA 81mg  and Brilinta 90mg  BID)  Statin.  LDL goal less than 70 Optimize glycemic control.  Hgb A1c goal less than 7 Blood pressure goal [id number] systolic.  Contact PCP regarding blood pressure. CPAP Follow up 5 months       Subjective:  Emily Leon is a 72 year old female with HTN, DM II, sleep apnea, and CKD who follows up for stroke.  She is accompanied by her sister who supplements history.    UPDATE: Current medications:  ASA 81mg , Brilinta 90mg  BID, atenolol, amlodipine, hydralazine, losartan, Zetia, metformin, Novolog, Trulicity, [place]  When I evaluated her on 03/17/2022, she appeared to exhibit inconsistent tactile hemineglect bilaterally but also drew the clock drawing in manner that may suggest LEFT hemineglect.  Due to concern for another interim stroke, she had an MRI of brain without contrast on 04/07/2022 which was stable, without new or acute findings.    She underwent neuropsychological evaluation on 05/27/2022 which revealed evidence consistent with mild neurocognitive disorder, definitely vascular component correlating with stroke but also diffuse impairment that cannot rule in or out an underlying neurodegenerative disease.    She was referred again to endovascular radiology regarding left MCA stenosis but still has not scheduled an appointment.   HISTORY: Around 10/30/2021, she began experiencing intermittent  episodes of slurred speech with word-finding difficulty, each lasting less than 10 minutes. She also endorsed mild intermittent headaches.  No facial droop, vision loss or unilateral numbness or weakness.  2 days later, she presented to [facility] in [place] where CT head showed no acute intracranial abnormality but CTA of head and neck showed severe stenosis versus partially occlusive thrombus of the left MCA involving the distal left M1 segment/left MCA bifurcation.  She was outside the window for tPA or endovascular intervention.  She was admitted for stroke workup.  MRI of brain revealed acute infarcts within the left frontoparietal lobes and left centrum semiovale. .  2D echocardiogram showed EF 60-65% with grade 1 diastolic dysfunction with severely increased left ventricular wall thickness but without thrombus, significant valvulopathy or interatrial shunt.  LDL was 82 and Hgb A1c was 10.5,  Due to recurrent transient symptoms, she had an EEG to rule out possible seizure activity, which was within normal limits.  Prior to admission, she was not on antiplatelet therapy.  She was discharged on both ASA 81mg  and Plavix 75mg  daily    She was readmitted on 7/22 for acute dysarthria and dizziness.  CT head showed no acute intracranial abnormality.  MRI of brain was negative for new acute infarct.  Neurology recommended permissive hypertension and she was monitored for 24 hours.  In case dizziness was related to Plavix, she was switched to Brilinta.     Following discharge, she has experienced significant anxiety.  She reports continuous dizziness.  When I ask her to describe the dizziness, it is a fuzzy vision, not lightheadedness or vertigo.  On 8/1, she was seen for difficulty breathing.  On  8/9, she was evaluated for generalized weakness.  On 8/24, she was seen again for ongoing dizziness and blurred vision.  CT head revealed no acute findings.  Also, since discharge, she reports left posterior neck pain  with palpation.  Intermittent blurred vision.   She had a formal eye exam in mid-November 2023 which revealed no significant abnormality, including visual field testing.    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   Chronic dyspnea 12/28/2021   Chronic kidney disease, stage 4 (severe) 09/02/2021   Cough    Dysuria 05/24/2022   Fatty liver disease, nonalcoholic [id number]   Generalized anxiety disorder 12/02/2021   Hypoxemia 05/29/2020   Insomnia 11/23/2021   Mild vascular dementia 05/27/2022   Mixed hyperlipidemia 03/02/2015   Morbid obesity with BMI of 40.0-44.9, adult 12/16/2016   Obstructive sleep apnea 11/13/2014   Osteoarthrosis involving multiple sites    Pneumonia due to COVID-19 virus 05/29/2020   Poorly controlled type 2 diabetes mellitus with peripheral neuropathy 03/02/2015   Posterior rhinorrhea    Proteinuria 06/15/2022   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     aspirin 81 MG chewable tablet Chew 81 mg by mouth daily.     atenolol (TENORMIN) 100 MG tablet Take 100 mg by mouth every morning.      B-D UF III MINI PEN NEEDLES 31G X 5 MM MISC USE 4 TIMES DAILY AS DIRECTED 400 each 1   BRILINTA 90 MG TABS tablet Take 90 mg by mouth 2 (two) times daily.     busPIRone (BUSPAR) 10 MG tablet Take  10 mg by mouth daily as needed.     Cholecalciferol (VITAMIN D-3) 25 MCG (1000 UT) CAPS Take by mouth.     Continuous Blood Gluc Receiver (FREESTYLE LIBRE READER) DEVI Use as instructed to check blood sugar with Freestyle Libre 3 sensors 1 each 0   Continuous Blood Gluc Sensor (FREESTYLE LIBRE 3 SENSOR) MISC 1 applicator by Does not apply route every 14 (fourteen) days. 6 each 3   ezetimibe (ZETIA) 10 MG tablet Take 10 mg by mouth at bedtime.     hydrALAZINE (APRESOLINE) 100 MG tablet Take 100 mg by mouth 3 (three) times daily.     insulin aspart (FIASP FLEXTOUCH) 100 UNIT/ML FlexTouch Pen Inject 20-25 Units into the skin with breakfast, with lunch, and with evening meal. 30 mL 1   insulin degludec (TRESIBA FLEXTOUCH) 200 UNIT/ML FlexTouch Pen INJECT 30-40 UNITS INTO THE SKIN DAILY 9 mL 1   insulin lispro (HUMALOG KWIKPEN) 100 UNIT/ML KwikPen Inject 20-25 Units into the skin 3 (three) times daily. 75 mL 1   loratadine (CLARITIN) 10 MG tablet Take 10 mg by mouth daily.     losartan (COZAAR) 100 MG  tablet Take 100 mg by mouth daily.     metFORMIN (GLUCOPHAGE-XR) 500 MG 24 hr tablet TAKE 2 TABLETS BY MOUTH EVERY DAY WITH BREAKFAST AND LUNCH 360 tablet 3   nitrofurantoin, macrocrystal-monohydrate, (MACROBID) 100 MG capsule Take 100 mg by mouth every 12 (twelve) hours.     ondansetron (ZOFRAN ODT) 4 MG disintegrating tablet Take 1 tablet (4 mg total) by mouth every 8 (eight) hours as needed for nausea or vomiting. 30 tablet 5   rosuvastatin (CRESTOR) 40 MG tablet Take 40 mg by mouth at bedtime.     TRULICITY 3 0000000 SOPN INJECT 3 MG INTO THE SKIN ONCE A WEEK. 6 mL 3   No current facility-administered medications on file prior to visit.    ALLERGIES: Allergies  Allergen Reactions   Cefdinir Diarrhea   Cephalexin Rash   Metoprolol Rash   Penicillins Itching and Rash   Sulfa Antibiotics Itching and Rash    FAMILY HISTORY: Family History  Problem Relation Age of Onset   Diabetes Mother     Stroke Sister    Diabetes Sister    Stroke Brother       Objective:  Blood pressure (!) 149/84, pulse 74, height 5\' 9"  (1.753 m), weight 242 lb (109.8 kg), SpO2 99 %. General: No acute distress.  Patient appears well-groomed.   Head:  Normocephalic/atraumatic Eyes:  Fundi examined but not visualized Neck: supple, no paraspinal tenderness, full range of motion Heart:  Regular rate and rhythm Neurological Exam: alert and oriented to person, place, and time.  Speech fluent and not dysarthric, language intact.  CN II-XII intact. Bulk and tone normal, muscle strength 5/5 throughout.  Sensation to light touch intact.  Deep tendon reflexes 2+ throughout.  Finger to nose testing intact.  Broad based cautious gait, Romberg negative.   Metta Clines, DO  CC: Kennieth Rad, MD

## 2022-07-08 ENCOUNTER — Telehealth: Payer: Self-pay

## 2022-07-08 ENCOUNTER — Ambulatory Visit: Payer: Medicare Other | Admitting: Neurology

## 2022-07-08 ENCOUNTER — Encounter: Payer: Self-pay | Admitting: Neurology

## 2022-07-08 VITALS — BP 149/84 | HR 74 | Ht 69.0 in | Wt 242.0 lb

## 2022-07-08 DIAGNOSIS — I63512 Cerebral infarction due to unspecified occlusion or stenosis of left middle cerebral artery: Secondary | ICD-10-CM

## 2022-07-08 DIAGNOSIS — I10 Essential (primary) hypertension: Secondary | ICD-10-CM

## 2022-07-08 DIAGNOSIS — F01A0 Vascular dementia, mild, without behavioral disturbance, psychotic disturbance, mood disturbance, and anxiety: Secondary | ICD-10-CM | POA: Diagnosis not present

## 2022-07-08 DIAGNOSIS — E119 Type 2 diabetes mellitus without complications: Secondary | ICD-10-CM | POA: Diagnosis not present

## 2022-07-08 DIAGNOSIS — E785 Hyperlipidemia, unspecified: Secondary | ICD-10-CM

## 2022-07-08 DIAGNOSIS — R42 Dizziness and giddiness: Secondary | ICD-10-CM

## 2022-07-08 NOTE — Telephone Encounter (Signed)
Telephone call to Dr. Cherlyn Labella office no answer.  Please call the office back to check on referral made 11/2021

## 2022-07-08 NOTE — Patient Instructions (Signed)
Refer to physical therapy for vestibular rehab Refer to Dr. Estanislado Pandy regarding left middle cerebral artery stenosis Continue current medications Follow up 5 months.

## 2022-07-22 NOTE — Telephone Encounter (Signed)
LMOVM for the second time to check referral. No answer. Phone number left to give the office a call back.

## 2022-07-26 ENCOUNTER — Other Ambulatory Visit (HOSPITAL_COMMUNITY): Payer: Self-pay | Admitting: Interventional Radiology

## 2022-07-26 ENCOUNTER — Telehealth (HOSPITAL_COMMUNITY): Payer: Self-pay

## 2022-07-26 DIAGNOSIS — I771 Stricture of artery: Secondary | ICD-10-CM

## 2022-07-26 NOTE — Telephone Encounter (Signed)
Called to schedule consult with Dr. Deveshwar, no answer, left vm. AB  

## 2022-08-03 ENCOUNTER — Other Ambulatory Visit (HOSPITAL_COMMUNITY): Payer: Self-pay | Admitting: Interventional Radiology

## 2022-08-03 ENCOUNTER — Ambulatory Visit (HOSPITAL_COMMUNITY)
Admission: RE | Admit: 2022-08-03 | Discharge: 2022-08-03 | Disposition: A | Discharge status: Home / Self Care | Payer: Medicare Other | Source: Ambulatory Visit | Attending: Interventional Radiology | Admitting: Interventional Radiology

## 2022-08-03 DIAGNOSIS — I771 Stricture of artery: Secondary | ICD-10-CM

## 2022-08-08 HISTORY — PX: IR RADIOLOGIST EVAL & MGMT: IMG5224

## 2022-08-17 ENCOUNTER — Telehealth (HOSPITAL_COMMUNITY): Payer: Self-pay | Admitting: Student

## 2022-08-17 MED ORDER — ALPRAZOLAM 1 MG PO TABS: 1.0000 mg | ORAL_TABLET | Freq: Once | ORAL | 0 refills | Status: AC

## 2022-08-17 NOTE — Telephone Encounter (Signed)
Patient scheduled for MRI 08/25/22. Patient states she has claustrophobia and has requested medication. Xanax 1 mg PO x 1 tablet e-prescribed [facility] her [facility] [place]. Per PMP Aware patient has been prescribed Xanax in the past (last prescription in February 2024).   Patient made aware to take the tablet one hour prior to her imaging study. Patient made aware that she needs to have a driver to/from her imaging study. Patient encouraged to call IR with any questions/concerns prior to her visit.   Alwyn Ren, [place] [phone] 08/17/2022, 3:22 PM

## 2022-08-25 ENCOUNTER — Ambulatory Visit (HOSPITAL_COMMUNITY)
Admission: RE | Admit: 2022-08-25 | Discharge: 2022-08-25 | Disposition: A | Discharge status: Home / Self Care | Payer: Medicare Other | Source: Ambulatory Visit | Attending: Interventional Radiology | Admitting: Interventional Radiology

## 2022-08-25 DIAGNOSIS — I6521 Occlusion and stenosis of right carotid artery: Secondary | ICD-10-CM | POA: Insufficient documentation

## 2022-08-25 DIAGNOSIS — I771 Stricture of artery: Secondary | ICD-10-CM | POA: Diagnosis present

## 2022-09-05 NOTE — Progress Notes (Unsigned)
Patient ID: [name], female   DOB: 25-Mar-1951, 72 y.o.   MRN: [id number]   HPI: Emily Leon is a 72 y.o.-year-old female, initially referred by her PCP, Dr. Celine Leon, returning for follow-up for DM2, dx at 72 y/o, insulin-dependent, uncontrolled, with long-term complications (CKD, PN, fatty liver).  She previously saw Dr. Fransico Leon, last visit with Leon 02/19/2020.  Last visit 4 months ago.  Interim history: No increased urination, + blurry vision, no chest pain. She has diarrhea if she eats early in am. She continues to have nausea and dizziness after her stroke.  Meclizine did not help.  She sees neurology.  Reviewed HbA1c levels: Lab Results  Component Value Date   HGBA1C 8.8 (A) 05/12/2022   HGBA1C 10.6 (A) 12/15/2021   HGBA1C 9.5 (A) 08/12/2021   HGBA1C 10.8 (A) 05/11/2021   HGBA1C 10.1 (A) 02/03/2021   HGBA1C 9.8 (A) 08/03/2020   HGBA1C 10.7 (A) 04/30/2020   HGBA1C 11.1 (A) 02/19/2020   HGBA1C 9.7 (A) 11/20/2019   HGBA1C 9.1 (A) 04/25/2019   Previously on: - Metformin ER 1000 mg with breakfast and lunch - Trulicity 1.5 mg weekly - U500 insulin 60-60-40 units 3x a day started 2 mo >> now 70 units before B b/c 40s and 50s Prev. On Novolog and [place].  Then to: - Metformin ER 1000 mg with breakfast and lunch - Trulicity 3 mg weekly - Tresiba 36 >> 42 units daily  - Novolog 20-22-26 >> 24-28 units >>  - 20 units before b'fast  - 25 units before lunch - 20 units before dinner  She checks her sugars more than 4 times a day with her freestyle libre CGM:  Previously:  Prev.    Lowest sugar was 40 (at night - ? Why) >> ... 50; she has hypoglycemia awareness at 83.  Highest sugar was  500 >> .Marland KitchenMarland Kitchen 300s >> 200s.  Pt's meals are: - 6 am: [employer] coffee - Breakfast: 8 am: egg, toast, fruit >> cereals, bananas - Lunch: salad and fruit - Dinner: baked chicken, salad, fruit - Snacks: -  -+ Mild CKD, last BUN/creatinine:  12/06/2021:  Ref Range & Units   Glucose  70 - 99 mg/dL 409 High    BUN 8 - 27 mg/dL 19   Creatinine 8.11 - 1.00 mg/dL 9.14 High    eGFR CKD-EPI CR 2021 >59 mL/min/1.73 27 Low    BUN/Creatinine Ratio 12 - 28 10 Low    Sodium 134 - 144 mmol/L 140   Potassium 3.5 - 5.2 mmol/L 3.9   Chloride 96 - 106 mmol/L 100   Bicarbonate (CO2) 20 - 29 mmol/L 23   Calcium 8.7 - 10.3 mg/dL 9.6   Phosphorus 3.0 - 4.3 mg/dL 3.1   Albumin 3.9 - 4.9 g/dL 4.0    7/82/9562: 13/0.86, GFR 34; protein to creatinine ratio 1639 Lab Results  Component Value Date   BUN 21 02/03/2021   BUN 13 10/15/2019   CREATININE 1.55 (H) 02/03/2021   CREATININE 1.0 10/15/2019  On valsartan 160 mg daily.  -+ HL; last set of lipids: Lab Results  Component Value Date   CHOL 184 02/03/2021   HDL 38.80 (L) 02/03/2021   LDLCALC 116 (H) 02/03/2021   TRIG 142.0 02/03/2021   CHOLHDL 5 02/03/2021  09/24/2020 (in [facility] with sinusitis): Glu 281, serum osm 301 (275-300), BUN/creatinine 12/1.4, GFR 41, CO2 29 09/09/2020: 20/1.55, GFR 40, glucose 270 06/02/2020: Glu 352, BUN/Cr 46/1.71, GFR 35 On Zetia-simvastatin 10-40 mg daily.  -  last eye exam was in 01/2022: No DR reportedly, + macular edema. Has a retina specialist in [place].   - + numbness and tingling in her feet. On B12 and Benfotiamine.  She sees podiatry.  Last foot exam 07/23/2021.  Pt has FH of DM in sister and unclear in mother  She also has a history of sleeve gastrectomy 2016, OSA-on CPAP.  ROS: + See HPI + Joint aches  I reviewed pt's medications, allergies, PMH, social hx, family hx, and changes were documented in the history of present illness. Otherwise, unchanged from my initial visit note.  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   Mixed hyperlipidemia 03/02/2015   Morbid obesity with BMI of 40.0-44.9, adult 12/16/2016   Obstructive sleep apnea 11/13/2014   Osteoarthrosis involving multiple sites    Pneumonia due to COVID-19 virus 05/29/2020   Poorly controlled type 2 diabetes mellitus with peripheral neuropathy 03/02/2015   Posterior rhinorrhea    Proteinuria 06/15/2022   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   Senile osteoporosis    Tingling    toes bilaterally   Urinary frequency    Vitamin D deficiency    Xerostomia 05/26/2022   Past Surgical History:  Procedure Laterality Date   ABDOMINAL HYSTERECTOMY     BREATH TEK H PYLORI N/A 08/26/2014   Procedure: BREATH TEK H PYLORI;  Surgeon: Emily Adu, MD;  Location: [facility];  Service: General;  Laterality: N/A;   IR RADIOLOGIST EVAL & MGMT  08/08/2022   KNEE SURGERY Left 2009   LAPAROSCOPIC GASTRIC SLEEVE RESECTION N/A 11/11/2014   Procedure: LAPAROSCOPIC GASTRIC SLEEVE RESECTION;  Surgeon: Emily Adu, MD;  Location: [facility];  Service: General;  Laterality: N/A;   UPPER GI ENDOSCOPY  11/11/2014   Procedure: UPPER GI ENDOSCOPY;  Surgeon: Emily Adu, MD;  Location: [facility];  Service: General;;   Social History   Socioeconomic History   Marital status: Married    Spouse name: Not on file   Number of children: 1   Years of education: 12   Highest education level: High school graduate  Occupational History   Occupation: Bus attendant  Tobacco Use   Smoking status: Never   Smokeless tobacco: Never  Vaping Use   Vaping Use: Never used  Substance and Sexual Activity   Alcohol use: No   Drug use: No   Sexual activity: Not on file  Other  Topics Concern   Not on file  Social History Narrative   Not on file   Social Determinants of Health   Financial Resource Strain: Not on file  Food Insecurity: Not on file  Transportation Needs: Not on file  Physical Activity: Not on file  Stress: Not on file  Social Connections: Not on file  Intimate Partner Violence: Not on file   Current Outpatient Medications on File Prior to Visit  Medication Sig Dispense Refill   amLODipine (NORVASC) 10 MG tablet  Take 10 mg by mouth every morning.     aspirin 81 MG chewable tablet Chew 81 mg by mouth daily.     atenolol (TENORMIN) 100 MG tablet Take 100 mg by mouth every morning.      B-D UF III MINI PEN NEEDLES 31G X 5 MM MISC USE 4 TIMES DAILY AS DIRECTED 400 each 1   BRILINTA 90 MG TABS tablet Take 90 mg by mouth 2 (two) times daily.     busPIRone (BUSPAR) 10 MG tablet Take 10 mg by mouth daily as needed.     Cholecalciferol (VITAMIN D-3) 25 MCG (1000 UT) CAPS Take by mouth.     clindamycin (CLEOCIN) 150 MG capsule Take 150 mg by mouth 4 (four) times daily.     Continuous Blood Gluc Receiver (FREESTYLE LIBRE READER) DEVI Use as instructed to check blood sugar with Freestyle Libre 3 sensors 1 each 0   Continuous Blood Gluc Sensor (FREESTYLE LIBRE 3 SENSOR) MISC 1 applicator by Does not apply route every 14 (fourteen) days. 6 each 3   ezetimibe (ZETIA) 10 MG tablet Take 10 mg by mouth at bedtime.     hydrALAZINE (APRESOLINE) 100 MG tablet Take 100 mg by mouth 3 (three) times daily.     insulin aspart (FIASP FLEXTOUCH) 100 UNIT/ML FlexTouch Pen Inject 20-25 Units into the skin with breakfast, with lunch, and with evening meal. 30 mL 1   Insulin Aspart, w/Niacinamide, (FIASP FLEXTOUCH [place]) Inject 3 mg into the skin daily.     insulin degludec (TRESIBA FLEXTOUCH) 200 UNIT/ML FlexTouch Pen INJECT 30-40 UNITS INTO THE SKIN DAILY 9 mL 1   insulin lispro (HUMALOG KWIKPEN) 100 UNIT/ML KwikPen Inject 20-25 Units into the skin 3 (three) times daily. 75 mL  1   JARDIANCE 10 MG TABS tablet Take 10 mg by mouth daily.     loperamide (IMODIUM) 2 MG capsule Take 2 mg by mouth as needed for diarrhea or loose stools.     loratadine (CLARITIN) 10 MG tablet Take 10 mg by mouth daily.     losartan (COZAAR) 100 MG tablet Take 100 mg by mouth daily.     metFORMIN (GLUCOPHAGE-XR) 500 MG 24 hr tablet TAKE 2 TABLETS BY MOUTH EVERY DAY WITH BREAKFAST AND LUNCH 360 tablet 3   nitrofurantoin, macrocrystal-monohydrate, (MACROBID) 100 MG capsule Take 100 mg by mouth every 12 (twelve) hours.     ondansetron (ZOFRAN ODT) 4 MG disintegrating tablet Take 1 tablet (4 mg total) by mouth every 8 (eight) hours as needed for nausea or vomiting. 30 tablet 5   rosuvastatin (CRESTOR) 40 MG tablet Take 40 mg by mouth at bedtime.     No current facility-administered medications on file prior to visit.   Allergies  Allergen Reactions   Cefdinir Diarrhea   Cephalexin Rash   Metoprolol Rash   Penicillins Itching and Rash   Sulfa Antibiotics Itching and Rash   Family History  Problem Relation Age of Onset   Diabetes Mother    Stroke Sister    Diabetes Sister    Stroke Brother    PE: There were no vitals taken for this visit. Wt Readings from Last 3 Encounters:  07/08/22 242 lb (109.8 kg)  05/12/22 254 lb 9.6 oz (115.5 kg)  03/17/22 250 lb 12.8 oz (113.8 kg)   Constitutional: overweight, in NAD Eyes: no exophthalmos ENT: no masses palpated in neck, no cervical lymphadenopathy Cardiovascular: RRR, No MRG Respiratory: CTA B Musculoskeletal: no deformities Skin: moist, warm, no rashes  Neurological: no tremor with outstretched hands  ASSESSMENT: 1. DM2, insulin-dependent, uncontrolled, with long-term complications - mild CKD - fatty liver - PN  2. HL  3.  Obesity class II  PLAN:  1. Patient with longstanding, uncontrolled, insulin resistant type 2 diabetes, on basal bolus insulin regimen, metformin, and GLP-1 receptor agonist, with still poor control.  At  last visit, HbA1c was actually better, at 8.8%, decreased from 10.6%, after stopping coffee with syrup and eliminating other sweet drinks.  Reviewing the CGM trends, sugars are better overnight and increasing after the first meal of the day but less so after the last meal of the day.  I did advise her to vary the dose of her rapid acting insulin based on the size of the meal but otherwise we did not change the rest of the regimen.  We had to change from NovoLog to Humalog since last visit per insurance preference. CGM interpretation: -At today's visit, we reviewed her CGM downloads: It appears that *** of values are in target range (goal >70%), while *** are higher than 180 (goal <25%), and *** are lower than 70 (goal <4%).  The calculated average blood sugar is ***.  The projected HbA1c for the next 3 months (GMI) is ***. -Reviewing the CGM trends, *** - I suggested to:  Patient Instructions  Please continue: - Metformin ER 1000 mg with breakfast and lunch - Trulicity 3 mg weekly - Tresiba 42 units daily - Humalog 20-25 units depending upon the size of the meal  Please return in 3-4 months.  - we checked her HbA1c: 7%  - advised to check sugars at different times of the day - 4x a day, rotating check times - advised for yearly eye exams >> she is UTD - return to clinic in 3-4 months  2. HL -Reviewed latest lipid panel from 01/2021: LDL above our target of less than 70, HDL slightly low: Lab Results  Component Value Date   CHOL 184 02/03/2021   HDL 38.80 (L) 02/03/2021   LDLCALC 116 (H) 02/03/2021   TRIG 142.0 02/03/2021   CHOLHDL 5 02/03/2021  -She continues simvastatin 40 mg daily and Zetia 10 mg daily without side effects -She had lipids with PCP checked this year but I do not have these records.  Further management per PCP.  3. Obesity class II -Continue the GLP-1 receptor agonist which should also help with weight loss -Last year, I strongly advised her to stop the [employer]  coffee. -Weight was stable since last visit  Carlus Pavlov, MD PhD [facility]

## 2022-09-06 ENCOUNTER — Other Ambulatory Visit (HOSPITAL_COMMUNITY): Payer: Self-pay | Admitting: Interventional Radiology

## 2022-09-06 ENCOUNTER — Other Ambulatory Visit: Payer: Self-pay | Admitting: Internal Medicine

## 2022-09-06 ENCOUNTER — Encounter: Payer: Self-pay | Admitting: Internal Medicine

## 2022-09-06 ENCOUNTER — Ambulatory Visit: Payer: Medicare Other | Admitting: Internal Medicine

## 2022-09-06 VITALS — BP 136/88 | HR 97 | Ht 69.0 in | Wt 245.0 lb

## 2022-09-06 DIAGNOSIS — E119 Type 2 diabetes mellitus without complications: Secondary | ICD-10-CM

## 2022-09-06 DIAGNOSIS — E66812 Obesity, class 2: Secondary | ICD-10-CM

## 2022-09-06 DIAGNOSIS — I771 Stricture of artery: Secondary | ICD-10-CM

## 2022-09-06 DIAGNOSIS — Z7984 Long term (current) use of oral hypoglycemic drugs: Secondary | ICD-10-CM

## 2022-09-06 DIAGNOSIS — E782 Mixed hyperlipidemia: Secondary | ICD-10-CM

## 2022-09-06 DIAGNOSIS — E1165 Type 2 diabetes mellitus with hyperglycemia: Secondary | ICD-10-CM | POA: Diagnosis not present

## 2022-09-06 DIAGNOSIS — E1142 Type 2 diabetes mellitus with diabetic polyneuropathy: Secondary | ICD-10-CM | POA: Diagnosis not present

## 2022-09-06 DIAGNOSIS — Z7985 Long-term (current) use of injectable non-insulin antidiabetic drugs: Secondary | ICD-10-CM

## 2022-09-06 DIAGNOSIS — Z794 Long term (current) use of insulin: Secondary | ICD-10-CM

## 2022-09-06 DIAGNOSIS — E669 Obesity, unspecified: Secondary | ICD-10-CM

## 2022-09-06 LAB — POCT GLYCOSYLATED HEMOGLOBIN (HGB A1C): Hemoglobin A1C: 9 % — AB (ref 4.0–5.6)

## 2022-09-06 MED ORDER — TRESIBA FLEXTOUCH 200 UNIT/ML ~~LOC~~ SOPN: 50.0000 [IU] | PEN_INJECTOR | Freq: Every day | SUBCUTANEOUS | 3 refills | Status: DC

## 2022-09-06 MED ORDER — TRULICITY 4.5 MG/0.5ML ~~LOC~~ SOAJ: 4.5000 mg | SUBCUTANEOUS | 3 refills | Status: DC

## 2022-09-06 NOTE — Patient Instructions (Addendum)
Please continue: - Metformin ER 1000 mg with breakfast and lunch - Jardiance 10 mg before b'fast - Fiasp: - 20 units before b'fast  - 25 units before lunch - 20 units before dinner  Increase: - Tresiba 50 units daily - Trulicity 4.5 mg weekly  Start the CGM.  Please return in 3-4 months.

## 2022-09-08 ENCOUNTER — Other Ambulatory Visit (HOSPITAL_COMMUNITY): Payer: Self-pay

## 2022-09-09 ENCOUNTER — Other Ambulatory Visit: Payer: Self-pay

## 2022-09-13 NOTE — Telephone Encounter (Signed)
Message from provider: Can you please let her know: I received her nephrologist's note and he recommended that we stopped metformin.  Please advise her to stop it and take it off her medication list.  Thank you!   Pt advised. Med list updated.

## 2022-09-14 ENCOUNTER — Telehealth (HOSPITAL_COMMUNITY): Payer: Self-pay

## 2022-09-14 ENCOUNTER — Other Ambulatory Visit: Payer: Self-pay | Admitting: Internal Medicine

## 2022-09-14 NOTE — Telephone Encounter (Signed)
-----   Message from [facility], [place] sent at 09/02/2022  3:18 PM EDT ----- Per Dr. Corliss Skains, please schedule for a diagnostic angiogram.   Thanks,  Lannette Donath

## 2022-09-28 ENCOUNTER — Telehealth (HOSPITAL_COMMUNITY): Payer: Self-pay

## 2022-09-28 NOTE — Telephone Encounter (Signed)
Called to move pt's appt. She would like to cancel for right now. She just found out she has cancer and will be starting treatments. She will call to reschedule when she is ready. AB

## 2022-10-11 ENCOUNTER — Ambulatory Visit (HOSPITAL_COMMUNITY): Payer: Medicare Other

## 2022-10-22 ENCOUNTER — Other Ambulatory Visit: Payer: Self-pay | Admitting: Internal Medicine

## 2022-10-22 DIAGNOSIS — E1142 Type 2 diabetes mellitus with diabetic polyneuropathy: Secondary | ICD-10-CM

## 2022-12-22 ENCOUNTER — Ambulatory Visit: Payer: Medicare Other | Admitting: Neurology

## 2022-12-25 ENCOUNTER — Other Ambulatory Visit: Payer: Self-pay | Admitting: Internal Medicine

## 2022-12-25 DIAGNOSIS — E1142 Type 2 diabetes mellitus with diabetic polyneuropathy: Secondary | ICD-10-CM

## 2022-12-27 ENCOUNTER — Telehealth: Payer: Self-pay | Admitting: Internal Medicine

## 2022-12-27 NOTE — Telephone Encounter (Signed)
Patient requesting to have her medication RX sent to [facility]. Please advise

## 2022-12-28 NOTE — Telephone Encounter (Signed)
LMTRC  JMiller,RMA 

## 2022-12-30 NOTE — Telephone Encounter (Signed)
The order was already placed by the family they just needed chart notes sent over.

## 2023-01-09 ENCOUNTER — Encounter: Payer: Self-pay | Admitting: Internal Medicine

## 2023-01-09 ENCOUNTER — Ambulatory Visit (INDEPENDENT_AMBULATORY_CARE_PROVIDER_SITE_OTHER): Payer: Medicare Other | Admitting: Internal Medicine

## 2023-01-09 VITALS — BP 142/62 | HR 97 | Resp 16 | Ht 69.0 in | Wt 226.6 lb

## 2023-01-09 DIAGNOSIS — E1165 Type 2 diabetes mellitus with hyperglycemia: Secondary | ICD-10-CM

## 2023-01-09 DIAGNOSIS — E1142 Type 2 diabetes mellitus with diabetic polyneuropathy: Secondary | ICD-10-CM | POA: Diagnosis not present

## 2023-01-09 DIAGNOSIS — Z7984 Long term (current) use of oral hypoglycemic drugs: Secondary | ICD-10-CM | POA: Diagnosis not present

## 2023-01-09 DIAGNOSIS — Z794 Long term (current) use of insulin: Secondary | ICD-10-CM

## 2023-01-09 DIAGNOSIS — E669 Obesity, unspecified: Secondary | ICD-10-CM

## 2023-01-09 DIAGNOSIS — E782 Mixed hyperlipidemia: Secondary | ICD-10-CM

## 2023-01-09 LAB — POCT GLYCOSYLATED HEMOGLOBIN (HGB A1C): Hemoglobin A1C: 8.2 % — AB (ref 4.0–5.6)

## 2023-01-09 NOTE — Addendum Note (Signed)
Addended by: Tera Partridge on: 01/09/2023 10:57 AM   Modules accepted: Orders

## 2023-01-09 NOTE — Patient Instructions (Addendum)
Please continue: - Jardiance 10 mg before b'fast - Fiasp: - 20 units before b'fast  - 25 units before lunch - 20 units before dinner - Tresiba 50 units daily - Trulicity 4.5 mg weekly  Try to inject the Admelog (yellow pen) in the upper thighs.  Please return in 3-4 months.

## 2023-01-09 NOTE — Progress Notes (Signed)
Patient ID: Emily Leon, female   DOB: 1950/06/10, 72 y.o.   MRN: [id number]   HPI: Emily Leon is a 72 y.o.-year-old female, initially referred by her PCP, Dr. Celine Mans, returning for follow-up for DM2, dx at 72 y/o, insulin-dependent, uncontrolled, with long-term complications (CVA, CKD, PN, fatty liver).  She previously saw Dr. Fransico Him, last visit with him 02/19/2020.  Last visit 4 months ago.  Interim history: No increased urination, + blurry vision, no chest pain. She continues to have nausea and dizziness after her stroke.  Meclizine did not help.  She sees neurology. She is very worried about her son who is in the hospital and not doing well. Her husband who accompanies her today tells me that he started to supervise her insulin injections as she was injecting it occasionally in the air before.  Reviewed HbA1c levels: Lab Results  Component Value Date   HGBA1C 9.0 (A) 09/06/2022   HGBA1C 8.8 (A) 05/12/2022   HGBA1C 10.6 (A) 12/15/2021   HGBA1C 9.5 (A) 08/12/2021   HGBA1C 10.8 (A) 05/11/2021   HGBA1C 10.1 (A) 02/03/2021   HGBA1C 9.8 (A) 08/03/2020   HGBA1C 10.7 (A) 04/30/2020   HGBA1C 11.1 (A) 02/19/2020   HGBA1C 9.7 (A) 11/20/2019   Previously on: - Metformin ER 1000 mg with breakfast and lunch - Trulicity 1.5 mg weekly - U500 insulin 60-60-40 units 3x a day started 2 mo >> now 60 units before B b/c 40s and 50s Prev. On Novolog and [place].  Now on: ->> stopped by nephrology 08/2022 - Jardiance 10 mg before b'fast - started by nephrology 04/2022 - Trulicity 3 >> 4.5 mg weekly - Tresiba 36 >> 42 >> 50 units daily  -  Fiasp 20-22-26 >> 24-28 units >> Admelog  - 20 units before b'fast  - 25 units before lunch - 20 units before dinner  She checks her sugars more than 4 times a day with her freestyle libre CGM:  Previously: - am: 140-150 - 2h after b'fast: ? - lunch: 150-180 - 2h after lunch: ? - dinner: 160-190 - 2h after dinner: ? - bedtime:  150-180  Previously:  Prev.    Lowest sugar was 40 (at night - ? Why) >> ... 50 >> 110 >> 68; she has hypoglycemia awareness at 72.  Highest sugar was  500 >> ...  200s >> 300.  Pt's meals are: - 6 am: [employer] coffee - Breakfast: 8 am: egg, toast, fruit >> cereals, bananas - Lunch: salad and fruit - Dinner: baked chicken, salad, fruit - Snacks: -  -+ Mild CKD - sees nephrology last BUN/creatinine:  12/06/2021:  Ref Range & Units   Glucose 70 - 99 mg/dL 629 High    BUN 8 - 27 mg/dL 19   Creatinine 5.28 - 1.00 mg/dL 4.13 High    eGFR CKD-EPI CR 2021 >59 mL/min/1.73 27 Low    BUN/Creatinine Ratio 12 - 28 10 Low    Sodium 134 - 144 mmol/L 140   Potassium 3.5 - 5.2 mmol/L 3.9   Chloride 96 - 106 mmol/L 100   Bicarbonate (CO2) 20 - 29 mmol/L 23   Calcium 8.7 - 10.3 mg/dL 9.6   Phosphorus 3.0 - 4.3 mg/dL 3.1   Albumin 3.9 - 4.9 g/dL 4.0    2/44/0102: 72/5.36, GFR 34; protein to creatinine ratio 1639 Lab Results  Component Value Date   BUN 21 02/03/2021   BUN 13 10/15/2019   CREATININE 1.55 (H) 02/03/2021   CREATININE 1.0 10/15/2019  On valsartan 160 mg daily.  -+ HL; last set of lipids: Lab Results  Component Value Date   CHOL 184 02/03/2021   HDL 38.80 (L) 02/03/2021   LDLCALC 116 (H) 02/03/2021   TRIG 142.0 02/03/2021   CHOLHDL 5 02/03/2021  09/24/2020 (in [facility] with sinusitis): Glu 281, serum osm 301 (275-300), BUN/creatinine 12/1.4, GFR 41, CO2 29 09/09/2020: 20/1.55, GFR 40, glucose 270 06/02/2020: Glu 352, BUN/Cr 46/1.71, GFR 35 Prev. On Zetia-simvastatin 10-40 mg daily, now Crestor 40 mg daily - changed 11/2022.  - last eye exam was in 01/2022: No DR reportedly, + macular edema. Has a retina specialist in [place].   - + numbness and tingling in her feet. On B12 and Benfotiamine.  She sees podiatry every 3 mo in [place] - no records available.  Last foot exam here in clinic 09/06/2022.  Pt has FH of DM in sister and unclear in mother  She also has a  history of sleeve gastrectomy 2016, OSA-on CPAP.  ROS: + See HPI  I reviewed pt's medications, allergies, PMH, social hx, family hx, and changes were documented in the history of present illness. Otherwise, unchanged from my initial visit note.  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   Mixed hyperlipidemia 03/02/2015   Morbid obesity with BMI of 40.0-44.9, adult 12/16/2016   Obstructive sleep apnea 11/13/2014   Osteoarthrosis involving multiple sites    Pneumonia due to COVID-19 virus 05/29/2020   Poorly controlled type 2 diabetes mellitus with peripheral neuropathy 03/02/2015   Posterior rhinorrhea    Proteinuria 06/15/2022   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   Senile osteoporosis    Tingling    toes bilaterally   Urinary frequency    Vitamin D deficiency    Xerostomia 05/26/2022   Past Surgical History:  Procedure Laterality Date   ABDOMINAL HYSTERECTOMY     BREATH TEK H PYLORI N/A 08/26/2014   Procedure: BREATH TEK H PYLORI;  Surgeon: Gaynelle Adu, MD;  Location: [facility];  Service: General;  Laterality: N/A;   IR RADIOLOGIST EVAL & MGMT  08/08/2022   KNEE SURGERY Left 2009   LAPAROSCOPIC GASTRIC SLEEVE RESECTION N/A 11/11/2014   Procedure: LAPAROSCOPIC GASTRIC SLEEVE RESECTION;  Surgeon: Gaynelle Adu, MD;  Location: [facility];  Service: General;  Laterality: N/A;   UPPER GI ENDOSCOPY  11/11/2014    Procedure: UPPER GI ENDOSCOPY;  Surgeon: Gaynelle Adu, MD;  Location: [facility];  Service: General;;   Social History   Socioeconomic History   Marital status: Married    Spouse name: Not on file   Number of children: 1   Years of education: 12   Highest education level: High school graduate  Occupational History   Occupation: Bus attendant  Tobacco Use   Smoking status: Never   Smokeless tobacco: Never  Vaping Use   Vaping status: Never Used  Substance and Sexual Activity   Alcohol use: No  Drug use: No   Sexual activity: Not on file  Other Topics Concern   Not on file  Social History Narrative   Not on file   Social Determinants of Health   Financial Resource Strain: Not on file  Food Insecurity: Not on file  Transportation Needs: Not on file  Physical Activity: Not on file  Stress: Not on file  Social Connections: Not on file  Intimate Partner Violence: Not on file   Current Outpatient Medications on File Prior to Visit  Medication Sig Dispense Refill   amLODipine (NORVASC) 10 MG tablet Take 10 mg by mouth every morning.     aspirin 81 MG chewable tablet Chew 81 mg by mouth daily.     atenolol (TENORMIN) 100 MG tablet Take 100 mg by mouth every morning.      B-D UF III MINI PEN NEEDLES 31G X 5 MM MISC USE 4 TIMES DAILY AS DIRECTED 400 each 1   BRILINTA 90 MG TABS tablet Take 90 mg by mouth 2 (two) times daily.     busPIRone (BUSPAR) 10 MG tablet Take 10 mg by mouth daily as needed.     Cholecalciferol (VITAMIN D-3) 25 MCG (1000 UT) CAPS Take by mouth.     clindamycin (CLEOCIN) 150 MG capsule Take 150 mg by mouth 4 (four) times daily.     Continuous Blood Gluc Receiver (FREESTYLE LIBRE READER) DEVI Use as instructed to check blood sugar with Freestyle Libre 3 sensors 1 each 0   Continuous Blood Gluc Sensor (FREESTYLE LIBRE 3 SENSOR) MISC 1 applicator by Does not apply route every 14 (fourteen) days. 6 each 3   Dulaglutide (TRULICITY) 4.5 MG/0.5ML SOPN Inject 4.5 mg as  directed once a week. 6 mL 3   ezetimibe (ZETIA) 10 MG tablet Take 10 mg by mouth at bedtime.     hydrALAZINE (APRESOLINE) 100 MG tablet Take 100 mg by mouth 3 (three) times daily.     Insulin Degludec FlexTouch 200 UNIT/ML SOPN INJECT 50 UNITS INTO THE SKIN DAILY 18 mL 3   insulin lispro (ADMELOG) 100 UNIT/ML injection Inject 20 to 25 units under skin 3 times a day before meals, as advised 60 mL 3   JARDIANCE 10 MG TABS tablet Take 10 mg by mouth daily.     loperamide (IMODIUM) 2 MG capsule Take 2 mg by mouth as needed for diarrhea or loose stools.     loratadine (CLARITIN) 10 MG tablet Take 10 mg by mouth daily.     losartan (COZAAR) 100 MG tablet Take 100 mg by mouth daily.     nitrofurantoin, macrocrystal-monohydrate, (MACROBID) 100 MG capsule Take 100 mg by mouth every 12 (twelve) hours.     ondansetron (ZOFRAN ODT) 4 MG disintegrating tablet Take 1 tablet (4 mg total) by mouth every 8 (eight) hours as needed for nausea or vomiting. 30 tablet 5   rosuvastatin (CRESTOR) 40 MG tablet Take 40 mg by mouth at bedtime.     No current facility-administered medications on file prior to visit.   Allergies  Allergen Reactions   Cefdinir Diarrhea   Cephalexin Rash   Metoprolol Rash   Penicillins Itching and Rash   Sulfa Antibiotics Itching and Rash   Family History  Problem Relation Age of Onset   Diabetes Mother    Stroke Sister    Diabetes Sister    Stroke Brother    PE: BP (!) 140/62 (BP Location: Left Arm, Patient Position: Sitting, Cuff Size: Normal)   Pulse  97   Resp 16   Ht 5\' 9"  (1.753 m)   Wt 226 lb 9.6 oz (102.8 kg)   SpO2 99%   BMI 33.46 kg/m  Wt Readings from Last 3 Encounters:  01/09/23 226 lb 9.6 oz (102.8 kg)  09/06/22 245 lb (111.1 kg)  07/08/22 242 lb (109.8 kg)   Constitutional: overweight, in NAD, in wheelchair Eyes: no exophthalmos ENT: no masses palpated in neck, no cervical lymphadenopathy Cardiovascular: tachycardia, RR, No MRG Respiratory: CTA  B Musculoskeletal: no deformities Skin: moist, warm, no rashes Neurological: no tremor with outstretched hands  ASSESSMENT: 1. DM2, insulin-dependent, uncontrolled, with long-term complications - CVA - mild CKD - fatty liver - PN  2. HL  3.  Obesity class II  PLAN:  1. Patient with longstanding, uncontrolled, insulin resistance type 2 diabetes, on basal-bolus insulin regimen along with SGLT2 inhibitor and GLP-1 receptor agonist, with still poor control.  Latest HbA1c was higher, at 9.0%. -At last visit she was off her CGM and I strongly advised her to restart.  Sugars were above target in the morning and they remain elevated or slightly increasing throughout the day.  I suggested to switch from Trulicity to [facility] but she was not sure whether this would be covered by her insurance and preferred to try to increase the dose of Trulicity first.  I also advised her to increase the [place] dose. CGM interpretation: -At today's visit, we reviewed her CGM downloads: It appears that 54% of values are in target range (goal >70%), while 36% are higher than 180 (goal <25%), and 0% are lower than 70 (goal <4%).  The calculated average blood sugar is 168.  The projected HbA1c for the next 3 months (GMI) is 7.3%. -Reviewing the CGM trends, sugars appear to have significantly improved in the last 2 weeks, possibly after her husband started to supervise her injections.  However, they are still increasing significantly after the first meal of the day and also after the last meal.  I feel that she is getting enough Admelog insulin with her meals, but we discussed about other possibilities: Not injecting them correctly (husband helps her with this, he is also diabetic).  Also another possibility could be scar tissue.  She injects in her abdomen.  I advised her to try to use the thighs.  Would not change her regimen otherwise.  She is off metformin as recommended by nephrology.  Will continue off the  medication. - I suggested to:  Patient Instructions  Please continue: - Jardiance 10 mg before b'fast - Fiasp: - 20 units before b'fast  - 25 units before lunch - 20 units before dinner - Tresiba 50 units daily - Trulicity 4.5 mg weekly  Try to inject the Admelog (yellow pen) in the upper thighs.  Please return in 3-4 months.  - we checked her HbA1c: 8.2% (lower) - advised to check sugars at different times of the day - 4x a day, rotating check times - advised for yearly eye exams >> she is UTD - return to clinic in 3-4 months  2. HL -Reviewed latest lipid panel from 01/2021: LDL above our target of less than 55, HDL slightly low, Lab Results  Component Value Date   CHOL 184 02/03/2021   HDL 38.80 (L) 02/03/2021   LDLCALC 116 (H) 02/03/2021   TRIG 142.0 02/03/2021   CHOLHDL 5 02/03/2021  -She was on Zetia and simvastatin but currently on Crestor 40 mg daily. -She had labs with PCP approximately 2  months ago and we will try to obtain them.  3. Obesity class II -She is on the GLP-1 receptor agonist and SGLT2 inhibitor which should both help with weight loss  -I strongly advised her to stop the [employer] coffee in the past -Weight was approximately stable at last visits, now lost almost 20 lbs  Carlus Pavlov, MD PhD [facility]

## 2023-02-01 ENCOUNTER — Telehealth: Payer: Self-pay | Admitting: Internal Medicine

## 2023-02-01 DIAGNOSIS — E1142 Type 2 diabetes mellitus with diabetic polyneuropathy: Secondary | ICD-10-CM

## 2023-02-01 MED ORDER — INSULIN LISPRO (1 UNIT DIAL) 100 UNIT/ML (KWIKPEN): PEN_INJECTOR | SUBCUTANEOUS | 2 refills | Status: AC

## 2023-02-01 NOTE — Telephone Encounter (Signed)
Patient advising they received the Admelog 100U/ML in vial form and not pen form and patient states she doesn't have the pens for this type of insulin. Patient asking what to do ? Please call [phone]

## 2023-02-01 NOTE — Telephone Encounter (Signed)
Requested Prescriptions   Signed Prescriptions Disp Refills   insulin lispro (ADMELOG SOLOSTAR) 100 UNIT/ML KwikPen 75 mL 2    Sig: Inject 20-25 units into the skin 3 times a day    Authorizing Provider: Carlus Pavlov    Ordering User: Valorie Roosevelt S    And the patient Husband has been advised and voices understanding.

## 2023-05-11 ENCOUNTER — Ambulatory Visit: Payer: Medicare Other | Admitting: Internal Medicine

## 2023-06-05 ENCOUNTER — Encounter: Payer: Self-pay | Admitting: Internal Medicine

## 2023-06-05 ENCOUNTER — Ambulatory Visit: Payer: Medicare Other | Admitting: Internal Medicine

## 2023-06-05 VITALS — BP 140/70 | HR 83 | Ht 69.0 in | Wt 224.2 lb

## 2023-06-05 DIAGNOSIS — Z794 Long term (current) use of insulin: Secondary | ICD-10-CM

## 2023-06-05 DIAGNOSIS — E782 Mixed hyperlipidemia: Secondary | ICD-10-CM

## 2023-06-05 DIAGNOSIS — Z7984 Long term (current) use of oral hypoglycemic drugs: Secondary | ICD-10-CM | POA: Diagnosis not present

## 2023-06-05 DIAGNOSIS — E1142 Type 2 diabetes mellitus with diabetic polyneuropathy: Secondary | ICD-10-CM

## 2023-06-05 DIAGNOSIS — E1165 Type 2 diabetes mellitus with hyperglycemia: Secondary | ICD-10-CM

## 2023-06-05 DIAGNOSIS — Z7985 Long-term (current) use of injectable non-insulin antidiabetic drugs: Secondary | ICD-10-CM

## 2023-06-05 DIAGNOSIS — E66811 Obesity, class 1: Secondary | ICD-10-CM

## 2023-06-05 LAB — POCT GLYCOSYLATED HEMOGLOBIN (HGB A1C): Hemoglobin A1C: 9.5 % — AB (ref 4.0–5.6)

## 2023-06-05 NOTE — Patient Instructions (Addendum)
Please continue: - Jardiance 10 mg before b'fast - Admelog: - 20 units before b'fast  - 20 units before lunch - 20 units before dinner - Tresiba 50 units daily - Trulicity 4.5 mg weekly  Try to inject the Admelog (yellow pen) in the upper thighs.  Please ask your primary care doctor to send me your latest lipid panel.  Please return in 3-4 months.

## 2023-06-05 NOTE — Progress Notes (Signed)
Patient ID: Emily Leon, female   DOB: 02-05-1951, 73 y.o.   MRN: [id number]   HPI: Emily Leon is a 73 y.o.-year-old female, initially referred by her PCP, Dr. Celine Mans, returning for follow-up for DM2, dx at 73 y/o, insulin-dependent, uncontrolled, with long-term complications (CVA, CKD, PN, fatty liver).  She previously saw Dr. Fransico Him, last visit with him 02/19/2020.  Last visit 5 months ago.  Interim history: No increased urination, + blurry vision, no chest pain. She had nausea and dizziness after her stroke >> resolved.  She continues to have memory loss, per husband's report.  Reviewed HbA1c levels: Lab Results  Component Value Date   HGBA1C 8.2 (A) 01/09/2023   HGBA1C 9.0 (A) 09/06/2022   HGBA1C 8.8 (A) 05/12/2022   HGBA1C 10.6 (A) 12/15/2021   HGBA1C 9.5 (A) 08/12/2021   HGBA1C 10.8 (A) 05/11/2021   HGBA1C 10.1 (A) 02/03/2021   HGBA1C 9.8 (A) 08/03/2020   HGBA1C 10.7 (A) 04/30/2020   HGBA1C 11.1 (A) 02/19/2020   Previously on: - Metformin ER 1000 mg with breakfast and lunch - Trulicity 1.5 mg weekly - U500 insulin 60-60-40 units 3x a day started 2 mo >> now 60 units before B b/c 40s and 50s Prev. On Novolog and [place].  Now on: ->> stopped by nephrology 08/2022 - Jardiance 10 mg before b'fast - started by nephrology 04/2022 - Trulicity 3 >> 4.5 mg weekly - Tresiba 36 >> 42 >> 50 units daily  -  Fiasp 20-22-26 >> 24-28 units >> Admelog  - 20 units before b'fast  - 25 >> 20 units before lunch - 20 units before dinner  She checks her sugars more than 4 times a day with her freestyle libre CGM:   Previously:   Previously:  Prev.    Lowest sugar was 40 (at night - ? Why) >> ... 50 >> 110 >> 68 >> 110; she has hypoglycemia awareness at 70.  Highest sugar was  500 >> ...  200s >> 300 >> 200s  Pt's meals are: - 6 am: [employer] coffee - Breakfast: 8 am: egg, toast, fruit >> cereals, bananas - Lunch: salad and fruit - Dinner: baked chicken, salad,  fruit - Snacks: -  -+ Mild CKD - sees nephrology last BUN/creatinine:  03/13/2023: 23/2.02, GFR 26, glucose 278, ACR 44 Lab Results  Component Value Date   BUN 21 02/03/2021   BUN 13 10/15/2019   CREATININE 1.55 (H) 02/03/2021   CREATININE 1.0 10/15/2019  On valsartan 160 mg daily.  -+ HL; last set of lipids: Lab Results  Component Value Date   CHOL 184 02/03/2021   HDL 38.80 (L) 02/03/2021   LDLCALC 116 (H) 02/03/2021   TRIG 142.0 02/03/2021   CHOLHDL 5 02/03/2021  Prev. On Zetia-simvastatin 10-40 mg daily, now Crestor 40 mg daily - changed 11/2022.  - last eye exam was in 2024: No DR reportedly, + macular edema. Has a retina specialist in [place].   - + numbness and tingling in her feet. On B12 and Benfotiamine.  She sees podiatry every 3 mo in [place] - no records available.  Last foot exam here in clinic 09/06/2022.  Pt has FH of DM in sister and unclear in mother  She also has a history of sleeve gastrectomy 2016, OSA-on CPAP.  ROS: + See HPI  I reviewed pt's medications, allergies, PMH, social hx, family hx, and changes were documented in the history of present illness. Otherwise, unchanged from my initial visit note.  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   Mixed hyperlipidemia 03/02/2015   Morbid obesity with BMI of 40.0-44.9, adult 12/16/2016   Obstructive sleep apnea 11/13/2014   Osteoarthrosis  involving multiple sites    Pneumonia due to COVID-19 virus 05/29/2020   Poorly controlled type 2 diabetes mellitus with peripheral neuropathy 03/02/2015   Posterior rhinorrhea    Proteinuria 06/15/2022   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   Senile osteoporosis    Tingling    toes bilaterally   Urinary frequency    Vitamin D deficiency    Xerostomia 05/26/2022   Past Surgical History:  Procedure Laterality Date   ABDOMINAL HYSTERECTOMY     BREATH TEK H PYLORI N/A 08/26/2014   Procedure: BREATH TEK H PYLORI;  Surgeon: Gaynelle Adu, MD;  Location: [facility];  Service: General;  Laterality: N/A;   IR RADIOLOGIST EVAL & MGMT  08/08/2022   KNEE SURGERY Left 2009   LAPAROSCOPIC GASTRIC SLEEVE RESECTION N/A 11/11/2014   Procedure: LAPAROSCOPIC GASTRIC SLEEVE RESECTION;  Surgeon: Gaynelle Adu, MD;  Location: [facility];  Service: General;  Laterality: N/A;   UPPER GI ENDOSCOPY  11/11/2014   Procedure: UPPER GI ENDOSCOPY;  Surgeon: Gaynelle Adu, MD;  Location: [facility];  Service: General;;   Social History   Socioeconomic History   Marital status: Married    Spouse name: Not on file   Number of children: 1   Years of education: 12   Highest education level: High school graduate  Occupational History   Occupation: Bus attendant  Tobacco Use   Smoking status: Never   Smokeless tobacco: Never  Vaping Use   Vaping status: Never Used  Substance and Sexual Activity   Alcohol use: No   Drug use: No   Sexual activity: Not on file  Other Topics Concern   Not on file  Social History Narrative   Not on file   Social Drivers of Health   Financial Resource Strain: Not on file  Food Insecurity: Not on file  Transportation Needs: Not on file  Physical Activity: Not on file  Stress: Not on file  Social Connections: Not on file  Intimate Partner Violence: Not on file   Current Outpatient Medications on File Prior to Visit  Medication Sig Dispense Refill   amLODipine (NORVASC) 10  MG tablet Take 10 mg by mouth every morning.     aspirin 81 MG chewable tablet Chew 81 mg by mouth daily.     atenolol (TENORMIN) 100 MG tablet Take 100 mg by mouth every morning.      B-D UF III MINI PEN NEEDLES 31G X 5 MM MISC USE 4 TIMES DAILY AS DIRECTED 400 each 1   BRILINTA 90 MG TABS tablet Take 90 mg by mouth 2 (two) times daily.     busPIRone (BUSPAR) 10 MG tablet Take 10 mg by mouth daily as needed.     Cholecalciferol (VITAMIN D-3) 25  MCG (1000 UT) CAPS Take by mouth.     clindamycin (CLEOCIN) 150 MG capsule Take 150 mg by mouth 4 (four) times daily.     Continuous Blood Gluc Receiver (FREESTYLE LIBRE READER) DEVI Use as instructed to check blood sugar with Freestyle Libre 3 sensors 1 each 0   Continuous Blood Gluc Sensor (FREESTYLE LIBRE 3 SENSOR) MISC 1 applicator by Does not apply route every 14 (fourteen) days. 6 each 3   Dulaglutide (TRULICITY) 4.5 MG/0.5ML SOPN Inject 4.5 mg as directed once a week. 6 mL 3   ezetimibe (ZETIA) 10 MG tablet Take 10 mg by mouth at bedtime.     hydrALAZINE (APRESOLINE) 100 MG tablet Take 100 mg by mouth 3 (three) times daily.     Insulin Degludec FlexTouch 200 UNIT/ML SOPN INJECT 50 UNITS INTO THE SKIN DAILY 18 mL 3   insulin lispro (ADMELOG SOLOSTAR) 100 UNIT/ML KwikPen Inject 20-25 units into the skin 3 times a day 75 mL 2   insulin lispro (ADMELOG) 100 UNIT/ML injection Inject 20 to 25 units under skin 3 times a day before meals, as advised 60 mL 3   JARDIANCE 10 MG TABS tablet Take 10 mg by mouth daily.     loperamide (IMODIUM) 2 MG capsule Take 2 mg by mouth as needed for diarrhea or loose stools.     loratadine (CLARITIN) 10 MG tablet Take 10 mg by mouth daily.     losartan (COZAAR) 100 MG tablet Take 100 mg by mouth daily.     nitrofurantoin, macrocrystal-monohydrate, (MACROBID) 100 MG capsule Take 100 mg by mouth every 12 (twelve) hours.     ondansetron (ZOFRAN ODT) 4 MG disintegrating tablet Take 1 tablet (4 mg total) by mouth every 8  (eight) hours as needed for nausea or vomiting. 30 tablet 5   rosuvastatin (CRESTOR) 40 MG tablet Take 40 mg by mouth at bedtime.     No current facility-administered medications on file prior to visit.   Allergies  Allergen Reactions   Cefdinir Diarrhea   Cephalexin Rash   Metoprolol Rash   Penicillins Itching and Rash   Sulfa Antibiotics Itching and Rash   Family History  Problem Relation Age of Onset   Diabetes Mother    Stroke Sister    Diabetes Sister    Stroke Brother    PE: BP (!) 140/70   Pulse 83   Ht 5\' 9"  (1.753 m)   Wt 224 lb 3.2 oz (101.7 kg)   SpO2 97%   BMI 33.11 kg/m  Wt Readings from Last 3 Encounters:  06/05/23 224 lb 3.2 oz (101.7 kg)  01/09/23 226 lb 9.6 oz (102.8 kg)  09/06/22 245 lb (111.1 kg)   Constitutional: overweight, in NAD, signif. Problem finding words Eyes: no exophthalmos ENT: no masses palpated in neck, no cervical lymphadenopathy Cardiovascular: tachycardia, RR, No MRG Respiratory: CTA B Musculoskeletal: no deformities Skin: moist, warm, no rashes Neurological: no tremor with outstretched hands Diabetic Foot Exam - Simple   Simple Foot Form Diabetic Foot exam was performed with the following findings: Yes 06/05/2023 11:01 AM  Visual Inspection No deformities, no ulcerations, no other skin breakdown bilaterally: Yes Sensation Testing See comments: Yes Pulse Check Posterior Tibialis and Dorsalis pulse intact bilaterally: Yes Comments Possible decreased sensation to monofilament in forefoot vs. Delayed verbal response L hallux thick toenail    ASSESSMENT: 1. DM2, insulin-dependent, uncontrolled, with long-term complications - CVA - mild CKD - fatty liver - PN  2. HL  3.  Obesity class I  PLAN:  1. Patient with longstanding, uncontrolled, insulin resistant type 2 diabetes, on basal/bolus insulin regimen along with SGLT2 inhibitor and GLP-1 receptor agonist, with still poor control.  At last visit HbA1c was better, at  8.2%, but still above target.  At that time, her husband was accompanying her and he did mention that he started to supervise her insulin injections and noticed that she was injecting it occasionally in the air before. -At last visit, reviewing the CGM data, sugars were significantly improved in the previous 2 weeks, possibly after her husband started to supervise her injections.  However, they were still increasing significantly after the first meal of the day and also after the last meal.  I felt that she was getting enough Admelog insulin with her meals but we discussed about other possibilities: Not injecting them correctly (husband now helps with this as he is also diabetic), and I also advised him not to injecting scar tissue.  She was using the abdomen for injections and I advised her to try to use the thighs.  She was off metformin as recommended by nephrology and we continued off the medication. CGM interpretation: -At today's visit, we reviewed her CGM downloads: It appears that 34% of values are in target range (goal >70%), while 66% are higher than 180 (goal <25%), and 0% are lower than 70 (goal <4%).  The calculated average blood sugar is 222.  The projected HbA1c for the next 3 months (GMI) is 8.6%. -Reviewing the CGM trends, sugars improving lymphadenopathy increase significantly after the first meal of the day and they remain elevated until close to midnight when they start to decrease.  Upon questioning, her husband has been less strict and supervising her injections and he feels that she has memory problems and forgets to take the insulin before meals.  At today's visit, patient does appear to have significant word finding and is slow to remember her regimen.  Also, she forgot about the advised at last visit to start injecting her insulin in upper thighs, rather than abdomen.  I again recommended to inject the analog and upper thighs.  I did not change her regimen otherwise.  I did advise them  to set alarms to remind her to take the insulin injection and husband agrees to observe for more closely when she takes the injections. - I suggested to:  Patient Instructions  Please continue: - Jardiance 10 mg before b'fast - Admelog: - 20 units before b'fast  - 20 units before lunch - 20 units before dinner - Tresiba 50 units daily - Trulicity 4.5 mg weekly  Try to inject the Admelog (yellow pen) in the upper thighs.  Please ask your primary care doctor to send me your latest lipid panel.  Please return in 3-4 months.  - we checked her HbA1c: 9.5% (higher) - advised to check sugars at different times of the day - 4x a day, rotating check times - advised for yearly eye exams >> she is UTD - return to clinic in 3-4 months  2. HL -Reviewed latest lipid panel from 01/2021: LDL above target of less than 55, HDL low: Lab Results  Component Value Date   CHOL 184 02/03/2021   HDL 38.80 (L) 02/03/2021   LDLCALC 116 (H) 02/03/2021   TRIG 142.0 02/03/2021   CHOLHDL 5 02/03/2021  -She was on Zetia and simvastatin but then switched to Crestor 40 mg daily -I do not have the latest lipid panel from PCP -  she has an appointment with him coming up and I advised her to check if they can send me the records.  3. Obesity class I -Out of the obesity class II category now -continue SGLT 2 inhibitor and GLP-1 receptor agonist which should also help with weight loss -I strongly advised her to stop the [employer] coffee in the past -She lost almost 20 pounds before last visit and lost 2 more pounds since last visit  Carlus Pavlov, MD PhD [facility]

## 2023-06-28 ENCOUNTER — Other Ambulatory Visit: Payer: Self-pay | Admitting: Internal Medicine

## 2023-06-29 ENCOUNTER — Telehealth: Payer: Self-pay

## 2023-06-29 NOTE — Telephone Encounter (Signed)
 Sample  Device/Supplies: FreeStyle Libre 3 plus Quantity:1 ZOX:[id number] EXP:11/16/23  Dicie Beam

## 2023-10-05 ENCOUNTER — Encounter: Payer: Self-pay | Admitting: Internal Medicine

## 2023-10-05 ENCOUNTER — Ambulatory Visit: Payer: Medicare Other | Admitting: Internal Medicine

## 2023-10-05 VITALS — BP 120/70 | HR 62 | Ht 69.0 in | Wt 234.2 lb

## 2023-10-05 DIAGNOSIS — E66811 Obesity, class 1: Secondary | ICD-10-CM

## 2023-10-05 DIAGNOSIS — E1165 Type 2 diabetes mellitus with hyperglycemia: Secondary | ICD-10-CM | POA: Diagnosis not present

## 2023-10-05 DIAGNOSIS — Z7984 Long term (current) use of oral hypoglycemic drugs: Secondary | ICD-10-CM

## 2023-10-05 DIAGNOSIS — E782 Mixed hyperlipidemia: Secondary | ICD-10-CM | POA: Diagnosis not present

## 2023-10-05 DIAGNOSIS — E1142 Type 2 diabetes mellitus with diabetic polyneuropathy: Secondary | ICD-10-CM | POA: Diagnosis not present

## 2023-10-05 DIAGNOSIS — Z794 Long term (current) use of insulin: Secondary | ICD-10-CM

## 2023-10-05 DIAGNOSIS — Z7985 Long-term (current) use of injectable non-insulin antidiabetic drugs: Secondary | ICD-10-CM

## 2023-10-05 LAB — POCT GLYCOSYLATED HEMOGLOBIN (HGB A1C): Hemoglobin A1C: 9.7 % — AB (ref 4.0–5.6)

## 2023-10-05 NOTE — Addendum Note (Signed)
 Addended by: Vernon Goodpasture on: 10/05/2023 11:15 AM   Modules accepted: Orders

## 2023-10-05 NOTE — Patient Instructions (Addendum)
 Please continue: - Jardiance 10 mg before b'fast - Tresiba  50 units daily - Trulicity  4.5 mg weekly  Increase: - Admelog : - 25-30 units before b'fast  - 25-30 units before lunch - 25-30 units before dinner  Please let us  know when we can send the sensor prescription again.  Please return in 3-4 months.

## 2023-10-05 NOTE — Progress Notes (Signed)
 Patient ID: [id number] NESBY, female   DOB: [birth year], 73 y.o.   MRN: [id number]   HPI: MONYA KOZAKIEWICZ is a 73 y.o.-year-old female, initially referred by her PCP, Dr. Dione Franks, returning for follow-up for DM2, dx at 73 y/o, insulin -dependent, uncontrolled, with long-term complications (CVA, CKD, PN, fatty liver).  She previously saw Dr. Monte Antonio, last visit with him 02/19/2020.  Last visit 4 months ago.  Interim history: No increased urination, blurry vision, nausea, chest pain. She continues to have memory loss, per husband's report.  Reviewed HbA1c levels: Lab Results  Component Value Date   HGBA1C 9.5 (A) 06/05/2023   HGBA1C 8.2 (A) 01/09/2023   HGBA1C 9.0 (A) 09/06/2022   HGBA1C 8.8 (A) 05/12/2022   HGBA1C 10.6 (A) 12/15/2021   HGBA1C 9.5 (A) 08/12/2021   HGBA1C 10.8 (A) 05/11/2021   HGBA1C 10.1 (A) 02/03/2021   HGBA1C 9.8 (A) 08/03/2020   HGBA1C 10.7 (A) 04/30/2020   Previously on: - Metformin  ER 1000 mg with breakfast and lunch - Trulicity  1.5 mg weekly - U500 insulin  60-60-40 units 3x a day started 2 mo >> now 60 units before B b/c 40s and 50s Prev. On Novolog  and Tresiba .  Now on: ->> stopped by nephrology 08/2022 - Jardiance 10 mg before b'fast - started by nephrology 04/2022 - Trulicity  3 >> 4.5 mg weekly - Tresiba  36 >> 42 >> 50 units daily  -  Fiasp  20-22-26 >> 24-28 units >> Admelog   - 20 units before b'fast  - 25 >> 20 units before lunch - 20 units before dinner  She was checking her sugars more than 4 times a day with her freestyle libre CGM, but now not covered: - am: 100-150 - 2h after b'fast:n/c - lunch: <180 - 2h after lunch: n/c - dinner: <225 - 2h after dinner: 180-210 - bedtime: n/c  Previously:   Previously:    Lowest sugar was 40 (at night - ? Why) >> ... 68 >> 110; she has hypoglycemia awareness at 70.  Highest sugar was  500 >> .Aaron Aas.  300 >> 200s  Pt's meals are: - 6 am: [employer] coffee - Breakfast: 8 am: egg, toast, fruit >> cereals,  bananas - Lunch: salad and fruit - Dinner: baked chicken, salad, fruit - Snacks: -  -+ Mild CKD - sees nephrology last BUN/creatinine:  09/12/2023: 40/2.14, GFR 24, glucose 308 03/13/2023: 23/2.02, GFR 26, glucose 278, ACR 44 Lab Results  Component Value Date   BUN 21 02/03/2021   BUN 13 10/15/2019   CREATININE 1.55 (H) 02/03/2021   CREATININE 1.0 10/15/2019  On valsartan 160 mg daily.  -+ HL; last set of lipids: 08/25/2023: 144/90/53/73 Lab Results  Component Value Date   CHOL 184 02/03/2021   HDL 38.80 (L) 02/03/2021   LDLCALC 116 (H) 02/03/2021   TRIG 142.0 02/03/2021   CHOLHDL 5 02/03/2021  Prev. On Zetia -simvastatin  10-40 mg daily, now Crestor 40 mg daily - changed 11/2022.  - last eye exam was in 2024: No DR reportedly, + macular edema. Has a retina specialist in [place].   - + numbness and tingling in her feet. On B12 and Benfotiamine.  She sees podiatry every 3 mo in [place] - no records available.  Last foot exam here in clinic 06/05/2023.  Pt has FH of DM in sister and unclear in mother  She also has a history of sleeve gastrectomy 2016, OSA-on CPAP.  ROS: + See HPI  I reviewed pt's medications, allergies, PMH, social hx, family hx, and changes  were documented in the history of present illness. Otherwise, unchanged from my initial visit note.  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   Mixed  hyperlipidemia 03/02/2015   Morbid obesity with BMI of 40.0-44.9, adult 12/16/2016   Obstructive sleep apnea 11/13/2014   Osteoarthrosis involving multiple sites    Pneumonia due to COVID-19 virus 05/29/2020   Poorly controlled type 2 diabetes mellitus with peripheral neuropathy 03/02/2015   Posterior rhinorrhea    Proteinuria 06/15/2022   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   Senile osteoporosis    Tingling    toes bilaterally   Urinary frequency    Vitamin D  deficiency    Xerostomia 05/26/2022   Past Surgical History:  Procedure Laterality Date   ABDOMINAL HYSTERECTOMY     BREATH TEK H PYLORI N/A 08/26/2014   Procedure: BREATH TEK H PYLORI;  Surgeon: Aldean Hummingbird, MD;  Location: [facility];  Service: General;  Laterality: N/A;   IR RADIOLOGIST EVAL & MGMT  08/08/2022   KNEE SURGERY Left 2009   LAPAROSCOPIC GASTRIC SLEEVE RESECTION N/A 11/11/2014   Procedure: LAPAROSCOPIC GASTRIC SLEEVE RESECTION;  Surgeon: Aldean Hummingbird, MD;  Location: [facility];  Service: General;  Laterality: N/A;   UPPER GI ENDOSCOPY  11/11/2014   Procedure: UPPER GI ENDOSCOPY;  Surgeon: Aldean Hummingbird, MD;  Location: [facility];  Service: General;;   Social History   Socioeconomic History   Marital status: Married    Spouse name: Not on file   Number of children: 1   Years of education: 12   Highest education level: High school graduate  Occupational History   Occupation: Bus attendant  Tobacco Use   Smoking status: Never   Smokeless tobacco: Never  Vaping Use   Vaping status: Never Used  Substance and Sexual Activity   Alcohol use: No   Drug use: No   Sexual activity: Not on file  Other Topics Concern   Not on file  Social History Narrative   Not on file   Social Drivers of Health   Financial Resource Strain: Not on file  Food Insecurity: Not on file  Transportation Needs: Not on file  Physical Activity: Not on file  Stress: Not on file  Social Connections: Not on file  Intimate Partner  Violence: Not on file   Current Outpatient Medications on File Prior to Visit  Medication Sig Dispense Refill   amLODipine  (NORVASC ) 10 MG tablet Take 10 mg by mouth every morning.     aspirin 81 MG chewable tablet Chew 81 mg by mouth daily.     atenolol  (TENORMIN ) 100 MG tablet Take 100 mg by mouth every morning.      B-D UF III MINI PEN NEEDLES 31G X 5 MM MISC USE 4 TIMES DAILY AS DIRECTED 400 each 1   BRILINTA 90 MG TABS tablet Take 90 mg by mouth 2 (two) times daily.     busPIRone (BUSPAR) 10  MG tablet Take 10 mg by mouth daily as needed.     Cholecalciferol (VITAMIN D -3) 25 MCG (1000 UT) CAPS Take by mouth.     clindamycin (CLEOCIN) 150 MG capsule Take 150 mg by mouth 4 (four) times daily. (Patient not taking: Reported on 06/05/2023)     Continuous Blood Gluc Receiver (FREESTYLE LIBRE READER) DEVI Use as instructed to check blood sugar with Freestyle Libre 3 sensors 1 each 0   Continuous Blood Gluc Sensor (FREESTYLE LIBRE 3 SENSOR) MISC 1 applicator by Does not apply route every 14 (fourteen) days. 6 each 3   Dulaglutide  (TRULICITY ) 4.5 MG/0.5ML SOPN Inject 4.5 mg as directed once a week. 6 mL 3   ezetimibe  (ZETIA ) 10 MG tablet Take 10 mg by mouth at bedtime.     hydrALAZINE (APRESOLINE) 100 MG tablet Take 100 mg by mouth 3 (three) times daily.     insulin  degludec (TRESIBA  FLEXTOUCH) 200 UNIT/ML FlexTouch Pen INJECT 50 UNITS INTO THE SKIN DAILY 18 mL 3   insulin  lispro (ADMELOG  SOLOSTAR) 100 UNIT/ML KwikPen Inject 20-25 units into the skin 3 times a day 75 mL 2   insulin  lispro (ADMELOG ) 100 UNIT/ML injection Inject 20 to 25 units under skin 3 times a day before meals, as advised 60 mL 3   JARDIANCE 10 MG TABS tablet Take 10 mg by mouth daily.     loperamide (IMODIUM) 2 MG capsule Take 2 mg by mouth as needed for diarrhea or loose stools.     loratadine (CLARITIN) 10 MG tablet Take 10 mg by mouth daily.     losartan (COZAAR) 100 MG tablet Take 100 mg by mouth daily.     nitrofurantoin,  macrocrystal-monohydrate, (MACROBID) 100 MG capsule Take 100 mg by mouth every 12 (twelve) hours.     ondansetron  (ZOFRAN  ODT) 4 MG disintegrating tablet Take 1 tablet (4 mg total) by mouth every 8 (eight) hours as needed for nausea or vomiting. 30 tablet 5   rosuvastatin (CRESTOR) 40 MG tablet Take 40 mg by mouth at bedtime.     No current facility-administered medications on file prior to visit.   Allergies  Allergen Reactions   Cefdinir Diarrhea   Cephalexin Rash   Metoprolol Rash   Penicillins Itching and Rash   Sulfa Antibiotics Itching and Rash   Family History  Problem Relation Age of Onset   Diabetes Mother    Stroke Sister    Diabetes Sister    Stroke Brother    PE: BP 120/70   Pulse 62   Ht 5' 9 (1.753 m)   Wt 234 lb 3.2 oz (106.2 kg)   SpO2 96%   BMI 34.59 kg/m  Wt Readings from Last 3 Encounters:  10/05/23 234 lb 3.2 oz (106.2 kg)  06/05/23 224 lb 3.2 oz (101.7 kg)  01/09/23 226 lb 9.6 oz (102.8 kg)   Constitutional: overweight, in NAD, signif. problem finding words Eyes: no exophthalmos ENT: no masses palpated in neck, no cervical lymphadenopathy Cardiovascular: RRR, No MRG Respiratory: CTA B Musculoskeletal: no deformities Skin: moist, warm, no rashes Neurological: no tremor with outstretched hands  ASSESSMENT: 1. DM2, insulin -dependent, uncontrolled, with long-term complications - CVA - mild CKD - fatty liver - PN  2. HL  3.  Obesity class I  PLAN:  1. Patient with longstanding, uncontrolled, type 2 diabetes, on basal-bolus insulin  regimen, weekly GLP-1 receptor agonist and daily SGLT2 inhibitor, with still poor control.  At last visit, HbA1c was higher, at 9.5%.  Sugars were  increasing after the first meal of the day and they remained elevated until close to midnight when they started to decrease.  I suspected lack of insulin  absorption from the injection site and I advised her to try to use her thighs for the injections.  We did not change the  regimen otherwise.  I did advise her to set alarms to remind her to take the insulin  injections and the husband agreed to observe her more closely when she was taking the injections. - at today's visit, she is unfortunately off the sensor, as husband mentions that this started being covered for them.  However, he would like to check with his insurance, as this may now cover another sensor.  I advised him to let me know right away.  They do report blood sugars at home that are usually lower than 220's, however, her HbA1c today to go higher than before, corresponding to an average blood sugar of 232.  Sugars are going up as the day goes by so I recommended an increase in Admelog .  They are now injecting it consistently, in her upper thighs.  I also advised the husband to look into his insurance formulary and see if we can switch from Trulicity  to Ozempic or Mounjaro.  Will continue the rest of the regimen for now. - I suggested to:  Patient Instructions  Please continue: - Jardiance 10 mg before b'fast - Tresiba  50 units daily - Trulicity  4.5 mg weekly  Increase: - Admelog : - 25-30 units before b'fast  - 25-30 units before lunch - 25-30 units before dinner  Please let us  know when we can send the sensor prescription again.  Please return in 3-4 months.  - we checked her HbA1c: 9.7% (higher) - advised to check sugars at different times of the day - 4x a day, rotating check times - advised for yearly eye exams >> she is UTD - return to clinic in 3-4 months  2. HL - Latest lipid panel was reviewed from 08/25/2023: 144/90/53/73-at goal except for an LDL above our target of less than 55 -She continues Crestor 40 mg daily  3. Obesity class I -continue SGLT 2 inhibitor and GLP-1 receptor agonist which should also help with weight loss -I strongly advised her to stop the [employer] coffee in the past - She lost approximately 20 pounds before the last 2 visits combined, but gained 10 pounds since  last visit  Emilie Harden, MD PhD [facility] Endocrinology

## 2023-10-14 ENCOUNTER — Encounter (HOSPITAL_COMMUNITY): Payer: Self-pay | Admitting: Interventional Radiology

## 2024-01-01 ENCOUNTER — Other Ambulatory Visit: Payer: Self-pay | Admitting: Internal Medicine

## 2024-01-01 DIAGNOSIS — E1142 Type 2 diabetes mellitus with diabetic polyneuropathy: Secondary | ICD-10-CM

## 2024-02-08 ENCOUNTER — Other Ambulatory Visit: Payer: Self-pay | Admitting: Internal Medicine

## 2024-02-08 ENCOUNTER — Ambulatory Visit: Admitting: Internal Medicine

## 2024-02-08 ENCOUNTER — Encounter: Payer: Self-pay | Admitting: Internal Medicine

## 2024-02-08 VITALS — BP 126/60 | HR 94 | Ht 69.0 in | Wt 219.8 lb

## 2024-02-08 DIAGNOSIS — Z7985 Long-term (current) use of injectable non-insulin antidiabetic drugs: Secondary | ICD-10-CM

## 2024-02-08 DIAGNOSIS — E782 Mixed hyperlipidemia: Secondary | ICD-10-CM | POA: Diagnosis not present

## 2024-02-08 DIAGNOSIS — E1165 Type 2 diabetes mellitus with hyperglycemia: Secondary | ICD-10-CM

## 2024-02-08 DIAGNOSIS — E1142 Type 2 diabetes mellitus with diabetic polyneuropathy: Secondary | ICD-10-CM | POA: Diagnosis not present

## 2024-02-08 DIAGNOSIS — E66811 Obesity, class 1: Secondary | ICD-10-CM | POA: Diagnosis not present

## 2024-02-08 LAB — POCT GLYCOSYLATED HEMOGLOBIN (HGB A1C): Hemoglobin A1C: 9.4 % — AB (ref 4.0–5.6)

## 2024-02-08 MED ORDER — DEXCOM G7 SENSOR MISC: 3.0000 | 4 refills | Status: DC

## 2024-02-08 MED ORDER — GLUCOSE BLOOD VI STRP: ORAL_STRIP | 3 refills | Status: DC

## 2024-02-08 MED ORDER — FREESTYLE LIBRE 3 PLUS SENSOR MISC: 1.0000 | 3 refills | Status: DC

## 2024-02-08 MED ORDER — DEXCOM G7 RECEIVER DEVI: 1.0000 | Freq: Once | 0 refills | Status: AC

## 2024-02-08 MED ORDER — ACCU-CHEK SOFTCLIX LANCETS MISC: 3 refills | Status: AC

## 2024-02-08 MED ORDER — TRESIBA FLEXTOUCH 200 UNIT/ML ~~LOC~~ SOPN: 50.0000 [IU] | PEN_INJECTOR | Freq: Every day | SUBCUTANEOUS | 3 refills | Status: AC

## 2024-02-08 MED ORDER — ACCU-CHEK GUIDE W/DEVICE KIT: PACK | 0 refills | Status: AC

## 2024-02-08 MED ORDER — FREESTYLE LIBRE 3 READER DEVI: 1.0000 | Freq: Once | 0 refills | Status: AC

## 2024-02-08 MED ORDER — MOUNJARO 5 MG/0.5ML ~~LOC~~ SOAJ: 5.0000 mg | SUBCUTANEOUS | 3 refills | Status: DC

## 2024-02-08 MED ORDER — FREESTYLE LITE TEST VI STRP: ORAL_STRIP | 12 refills | Status: AC

## 2024-02-08 NOTE — Patient Instructions (Addendum)
 Please continue: - Jardiance 10 mg before b'fast - Tresiba  50 units daily - Admelog  15 min before each meal: - 25-30 units before b'fast  - 25-30 units before lunch - 25-30 units before dinner  Try to change from Trulicity  to: - Mounjaro 5 mg weekly Let me know in 3 weeks if we can switch to the higher Mounajro dose.  Try to start back on the sensor.  Please return in 1-1.5 month.

## 2024-02-08 NOTE — Progress Notes (Signed)
 Patient ID: Emily Leon, female   DOB: 1951-01-06, 73 y.o.   MRN: [id number]   HPI: Emily Leon is a 73 y.o.-year-old female, initially referred by her PCP, Dr. Meade, returning for follow-up for DM2, dx at 73 y/o, insulin -dependent, uncontrolled, with long-term complications (CVA, CKD, PN, fatty liver).  She previously saw Dr. Lenis, last visit with him 02/19/2020.  Last visit 4 months ago.  Interim history: No increased urination, blurry vision, nausea, chest pain. She continues to have memory loss, per husband's report. Her kidney function is worse, now stage IV.  Reviewed HbA1c levels: Lab Results  Component Value Date   HGBA1C 9.7 (A) 10/05/2023   HGBA1C 9.5 (A) 06/05/2023   HGBA1C 8.2 (A) 01/09/2023   HGBA1C 9.0 (A) 09/06/2022   HGBA1C 8.8 (A) 05/12/2022   HGBA1C 10.6 (A) 12/15/2021   HGBA1C 9.5 (A) 08/12/2021   HGBA1C 10.8 (A) 05/11/2021   HGBA1C 10.1 (A) 02/03/2021   HGBA1C 9.8 (A) 08/03/2020   Previously on: - Metformin  ER 1000 mg with breakfast and lunch - Trulicity  1.5 mg weekly - U500 insulin  60-60-40 units 3x a day started 2 mo >> now 60 units before B b/c 40s and 50s Prev. On Novolog  and Tresiba .  Now on: - Jardiance 10 mg before b'fast - started by nephrology 04/2022 - Trulicity  3 >> 4.5 mg weekly - Tresiba  36 >> 42 >> 50 units daily  - Admelog   - 25-30 units before b'fast  - 25-30 units before lunch - 25-30 units before dinner She was previously on metformin  but this was stopped by nephrology due to low kidney function. She was previously on NovoLog , Humalog , Fiasp .  She was checking her sugars more than 4 times a day with her freestyle libre CGM, but now not covered: - am: 100-150 >> 180-300s - 2h after b'fast:n/c - lunch: <180 - 2h after lunch: n/c - dinner: <225 - 2h after dinner: 180-210 >> up to 400s, HI - bedtime: n/c  Previously:   Previously:   Lowest sugar was 40 (at night - ? Why) >> ... 68 >> 110 >> 59; she has hypoglycemia  awareness at 72.  Highest sugar was  500 >> .SABRA.  300 >> 200s >> HI.  Pt's meals are: - 6 am: [employer] coffee - Breakfast: 8 am: egg, toast, fruit >> cereals, bananas - Lunch: salad and fruit - Dinner: baked chicken, salad, fruit - Snacks: -  -+ Mild CKD - sees nephrology last BUN/creatinine:  09/12/2023: 40/2.14, GFR 24, glucose 308 03/13/2023: 23/2.02, GFR 26, glucose 278, ACR 44 Lab Results  Component Value Date   BUN 21 02/03/2021   BUN 13 10/15/2019   CREATININE 1.55 (H) 02/03/2021   CREATININE 1.0 10/15/2019  On valsartan 160 mg daily.  -+ HL; last set of lipids: 08/25/2023: 144/90/53/73 Lab Results  Component Value Date   CHOL 184 02/03/2021   HDL 38.80 (L) 02/03/2021   LDLCALC 116 (H) 02/03/2021   TRIG 142.0 02/03/2021   CHOLHDL 5 02/03/2021  Prev. On Zetia -simvastatin  10-40 mg daily, now Crestor 40 mg daily - changed 11/2022.  - last eye exam was in 2024: No DR reportedly, + macular edema. Has a retina specialist in [place].   - + numbness and tingling in her feet. On B12 and Benfotiamine.  She sees podiatry every 3 mo in [place] - no records available.  Last foot exam here in clinic 06/05/2023.  Pt has FH of DM in sister and unclear in mother  She also  has a history of sleeve gastrectomy 2016, OSA-on CPAP.  ROS: + See HPI  I reviewed pt's medications, allergies, PMH, social hx, family hx, and changes were documented in the history of present illness. Otherwise, unchanged from my initial visit note.  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   Mixed hyperlipidemia 03/02/2015   Morbid obesity with BMI of 40.0-44.9, adult 12/16/2016   Obstructive sleep apnea 11/13/2014   Osteoarthrosis involving multiple sites    Pneumonia due to COVID-19 virus 05/29/2020   Poorly controlled type 2 diabetes mellitus with peripheral neuropathy 03/02/2015   Posterior rhinorrhea    Proteinuria 06/15/2022   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   Senile osteoporosis    Tingling    toes bilaterally   Urinary frequency    Vitamin D  deficiency    Xerostomia 05/26/2022   Past Surgical History:  Procedure Laterality Date   ABDOMINAL HYSTERECTOMY     BREATH TEK H PYLORI N/A 08/26/2014   Procedure: BREATH TEK H PYLORI;  Surgeon: Camellia Blush, MD;  Location: [facility];  Service: General;  Laterality: N/A;   IR RADIOLOGIST EVAL & MGMT  08/08/2022   KNEE SURGERY Left 2009   LAPAROSCOPIC GASTRIC SLEEVE RESECTION N/A 11/11/2014   Procedure: LAPAROSCOPIC GASTRIC SLEEVE RESECTION;  Surgeon: Camellia Blush, MD;  Location: [facility];  Service: General;  Laterality: N/A;   UPPER GI ENDOSCOPY  11/11/2014   Procedure: UPPER GI ENDOSCOPY;  Surgeon: Camellia Blush, MD;  Location: [facility];  Service: General;;   Social History   Socioeconomic History   Marital status: Married    Spouse name: Not on file   Number of children: 1   Years of education: 12   Highest education level: High school graduate  Occupational History   Occupation: Bus attendant  Tobacco Use   Smoking status: Never   Smokeless tobacco: Never  Vaping Use   Vaping status: Never Used  Substance and Sexual Activity   Alcohol use: No   Drug use: No   Sexual activity: Not on file  Other Topics Concern   Not on file  Social History Narrative   Not on file   Social Drivers of Health   Financial Resource Strain: Not on file  Food  Insecurity: Not on file  Transportation Needs: Not on file  Physical Activity: Not on file  Stress: Not on file  Social Connections: Not on file  Intimate Partner Violence: Not on file   Current Outpatient Medications on File Prior to Visit  Medication Sig Dispense Refill   amLODipine  (NORVASC ) 10 MG tablet Take 10 mg by mouth every morning.     aspirin 81 MG chewable tablet Chew 81 mg by mouth daily.     atenolol  (TENORMIN ) 100 MG tablet Take 100 mg by mouth every morning.      BRILINTA 90 MG TABS tablet Take 90 mg by mouth 2 (two) times daily.  busPIRone (BUSPAR) 10 MG tablet Take 10 mg by mouth daily as needed.     Cholecalciferol (VITAMIN D -3) 25 MCG (1000 UT) CAPS Take by mouth.     clindamycin (CLEOCIN) 150 MG capsule Take 150 mg by mouth 4 (four) times daily.     Continuous Blood Gluc Receiver (FREESTYLE LIBRE READER) DEVI Use as instructed to check blood sugar with Freestyle Libre 3 sensors 1 each 0   Continuous Blood Gluc Sensor (FREESTYLE LIBRE 3 SENSOR) MISC 1 applicator by Does not apply route every 14 (fourteen) days. 6 each 3   Dulaglutide  (TRULICITY ) 4.5 MG/0.5ML SOPN Inject 4.5 mg as directed once a week. 6 mL 3   ezetimibe  (ZETIA ) 10 MG tablet Take 10 mg by mouth at bedtime.     hydrALAZINE (APRESOLINE) 100 MG tablet Take 100 mg by mouth 3 (three) times daily.     insulin  degludec (TRESIBA  FLEXTOUCH) 200 UNIT/ML FlexTouch Pen INJECT 50 UNITS INTO THE SKIN DAILY 18 mL 3   insulin  lispro (ADMELOG  SOLOSTAR) 100 UNIT/ML KwikPen Inject 20-25 units into the skin 3 times a day 75 mL 2   insulin  lispro (ADMELOG ) 100 UNIT/ML injection Inject 20 to 25 units under skin 3 times a day before meals, as advised 60 mL 3   Insulin  Pen Needle (BD PEN NEEDLE MINI ULTRAFINE) 31G X 5 MM MISC USE 4 TIMES DAILY AS DIRECTED 400 each 12   JARDIANCE 10 MG TABS tablet Take 10 mg by mouth daily.     loperamide (IMODIUM) 2 MG capsule Take 2 mg by mouth as needed for diarrhea or loose stools.      loratadine (CLARITIN) 10 MG tablet Take 10 mg by mouth daily.     losartan (COZAAR) 100 MG tablet Take 100 mg by mouth daily.     nitrofurantoin, macrocrystal-monohydrate, (MACROBID) 100 MG capsule Take 100 mg by mouth every 12 (twelve) hours.     ondansetron  (ZOFRAN  ODT) 4 MG disintegrating tablet Take 1 tablet (4 mg total) by mouth every 8 (eight) hours as needed for nausea or vomiting. 30 tablet 5   rosuvastatin (CRESTOR) 40 MG tablet Take 40 mg by mouth at bedtime.     No current facility-administered medications on file prior to visit.   Allergies  Allergen Reactions   Cefdinir Diarrhea   Cephalexin Rash   Metoprolol Rash   Penicillins Itching and Rash   Sulfa Antibiotics Itching and Rash   Family History  Problem Relation Age of Onset   Diabetes Mother    Stroke Sister    Diabetes Sister    Stroke Brother    PE: BP 126/60   Pulse 94   Ht 5' 9 (1.753 m)   Wt 219 lb 12.8 oz (99.7 kg)   SpO2 96%   BMI 32.46 kg/m  Wt Readings from Last 3 Encounters:  02/08/24 219 lb 12.8 oz (99.7 kg)  10/05/23 234 lb 3.2 oz (106.2 kg)  06/05/23 224 lb 3.2 oz (101.7 kg)   Constitutional: overweight, in NAD, signif. problem finding words Eyes: no exophthalmos ENT: no masses palpated in neck, no cervical lymphadenopathy Cardiovascular: Tachycardia, RR, No MRG Respiratory: CTA B Musculoskeletal: no deformities Skin:  no rashes Neurological: no tremor with outstretched hands  ASSESSMENT: 1. DM2, insulin -dependent, uncontrolled, with long-term complications - CVA - mild CKD - fatty liver - PN  2. HL  3.  Obesity class I  PLAN:  1. Patient with longstanding, uncontrolled, type II diet on basal-bolus insulin  regimen along with weekly  GLP-1 receptor agonist and oral SGLT2 inhibitor, with still very poor control.  At last visit, HbA1c was even higher than previously, at 9.7%.  She was unfortunately off her sensor as this was not covered for her anymore.  I advised her to check with  her insurance to see if maybe another sensor would be covered.  I advised her to let me know right away.  They did report blood sugars that were improved from previously, but the HbA1c was higher, corresponding to an average blood sugar of 232.  Sugars were going up as the day went by so I recommended an increase in her mealtime insulin .  She was injecting it consistently, in her upper thighs.  I also advised the husband to look on their insurance formulary to see if we could switch from Trulicity  to Ozempic or Mounjaro.  We did not change the rest of the regimen. -At today's visit she brings her meter with her but it did not record time and date so it is difficult to know which sugars are checked when.  The sugars vary between approximately 140-300s but with occasional values in the 400s-HI.  She did have 1 low at 59.  Unfortunately, they were not able to obtain the CGM.  Husband would want me to call in publicity libre and Dexcom sensor to her pharmacy (done).  I would also suggest to try to switch from Trulicity  to Mounjaro and send a prescription for this to her pharmacy.  They described that sugars may drop after she takes the entire dose of Admelog  but I suspect that this is due to the fact she is bolusing too late, when the sugars are already high after meals.  I advised her to inject it 15 minutes before each meal. -We absolutely need to improve her blood sugars in the setting of her worsening kidney function. - I suggested to:  Patient Instructions  Please continue: - Jardiance 10 mg before b'fast - Tresiba  50 units daily - Admelog  15 min before each meal: - 25-30 units before b'fast  - 25-30 units before lunch - 25-30 units before dinner  Try to change from Trulicity  to: - Mounjaro 5 mg weekly Let me know in 3 weeks if we can switch to the higher Mounajro dose.  Try to start back on the sensor.  Please return in 1-1.5 month.  - we checked her HbA1c: 9.4% (slightly lower) - advised to  check sugars at different times of the day - 4x a day, rotating check times - advised for yearly eye exams >> she is UTD - return to clinic in 1-1.5 months  2. HL - Latest lipid panel was reviewed from 08/2023: 144/90/53/73-at goal with the exception of an LDL above our target of less than 55 - She continues on Crestor 40 mg daily  3. Obesity class I - will continue the SGLT2 inhibitor and GLP-1 receptor agonist, which should also help with weight loss - I previously strongly advised her to stop the [employer] coffee - She gained 10 pounds before last visit but lost 15 pounds since then  Lela Fendt, MD PhD [facility] Endocrinology

## 2024-02-09 NOTE — Addendum Note (Signed)
 Addended by: CLEOTILDE ROLIN RAMAN on: 02/09/2024 08:03 AM   Modules accepted: Orders

## 2024-03-11 LAB — OPHTHALMOLOGY REPORT-SCANNED

## 2024-03-18 ENCOUNTER — Other Ambulatory Visit: Payer: Self-pay | Admitting: Internal Medicine

## 2024-03-21 ENCOUNTER — Ambulatory Visit: Admitting: Internal Medicine

## 2024-03-21 MED ORDER — FREESTYLE LIBRE 3 PLUS SENSOR MISC: 1.0000 | 3 refills | Status: AC

## 2024-03-21 NOTE — Addendum Note (Signed)
 Addended by: CLEOTILDE ROLIN RAMAN on: 03/21/2024 09:32 AM   Modules accepted: Orders

## 2024-03-25 NOTE — Progress Notes (Deleted)
 Patient ID: Emily Leon, female   DOB: 02/22/1951, 73 y.o.   MRN: [id number]   HPI: Emily Leon is a 73 y.o.-year-old female, initially referred by her PCP, Dr. Meade, returning for follow-up for DM2, dx at 73 y/o, insulin -dependent, uncontrolled, with long-term complications (CVA, CKD, PN, fatty liver).  She previously saw Dr. Lenis, last visit with him 02/19/2020.  Last visit 4 months ago.  Interim history: No increased urination, blurry vision, nausea, chest pain. She continues to have memory loss, per husband's report. Her kidney function is worse, now stage IV.  Reviewed HbA1c levels: Lab Results  Component Value Date   HGBA1C 9.4 (A) 02/08/2024   HGBA1C 9.7 (A) 10/05/2023   HGBA1C 9.5 (A) 06/05/2023   HGBA1C 8.2 (A) 01/09/2023   HGBA1C 9.0 (A) 09/06/2022   HGBA1C 8.8 (A) 05/12/2022   HGBA1C 10.6 (A) 12/15/2021   HGBA1C 9.5 (A) 08/12/2021   HGBA1C 10.8 (A) 05/11/2021   HGBA1C 10.1 (A) 02/03/2021   Previously on: - Metformin  ER 1000 mg with breakfast and lunch - Trulicity  1.5 mg weekly - U500 insulin  60-60-40 units 3x a day started 2 mo >> now 60 units before B b/c 40s and 50s Prev. On Novolog  and Tresiba .  Now on: - Jardiance 10 mg before b'fast - started by nephrology 04/2022 - Trulicity  3 >> 4.5 mg weekly >> Mounjaro  5 mg weekly - Tresiba  36 >> 42 >> 50 units daily  - Admelog   - 25-30 units before b'fast  - 25-30 units before lunch - 25-30 units before dinner She was previously on metformin  but this was stopped by nephrology due to low kidney function. She was previously on NovoLog , Humalog , Fiasp .  She was checking her sugars more than 4 times a day with her freestyle libre CGM, but she did not have it on at last visit as this was not covered.   - am: 100-150 >> 180-300s - 2h after b'fast:n/c - lunch: <180 - 2h after lunch: n/c - dinner: <225 - 2h after dinner: 180-210 >> up to 400s, HI - bedtime: n/c  Previously:   Previously:   Lowest sugar  was 40 (at night - ? Why) >> ... 68 >> 110 >> 59; she has hypoglycemia awareness at 70.  Highest sugar was  500 >> .SABRA.  300 >> 200s >> HI.  Pt's meals are: - 6 am: [employer] coffee - Breakfast: 8 am: egg, toast, fruit >> cereals, bananas - Lunch: salad and fruit - Dinner: baked chicken, salad, fruit - Snacks: -  -+ Mild CKD - sees nephrology last BUN/creatinine:  09/12/2023: 40/2.14, GFR 24, glucose 308 03/13/2023: 23/2.02, GFR 26, glucose 278, ACR 44 Lab Results  Component Value Date   BUN 21 02/03/2021   BUN 13 10/15/2019   CREATININE 1.55 (H) 02/03/2021   CREATININE 1.0 10/15/2019  On valsartan 160 mg daily.  -+ HL; last set of lipids: 08/25/2023: 144/90/53/73 Lab Results  Component Value Date   CHOL 184 02/03/2021   HDL 38.80 (L) 02/03/2021   LDLCALC 116 (H) 02/03/2021   TRIG 142.0 02/03/2021   CHOLHDL 5 02/03/2021  Prev. On Zetia -simvastatin  10-40 mg daily, now Crestor 40 mg daily - changed 11/2022.  - last eye exam was on 03/11/2024: + DR. Has a retina specialist in [place].   - + numbness and tingling in her feet. On B12 and Benfotiamine.  She sees podiatry every 3 mo in [place] - no records available.  Last foot exam here in clinic 06/05/2023.  Pt has FH of DM in sister and unclear in mother  She also has a history of sleeve gastrectomy 2016, OSA-on CPAP.  ROS: + See HPI  I reviewed pt's medications, allergies, PMH, social hx, family hx, and changes were documented in the history of present illness. Otherwise, unchanged from my initial visit note.  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   Mixed hyperlipidemia 03/02/2015   Morbid obesity with BMI of 40.0-44.9, adult 12/16/2016   Obstructive sleep apnea 11/13/2014   Osteoarthrosis involving multiple sites    Pneumonia due to COVID-19 virus 05/29/2020   Poorly controlled type 2 diabetes mellitus with peripheral neuropathy 03/02/2015   Posterior rhinorrhea    Proteinuria 06/15/2022   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   Senile osteoporosis    Tingling    toes bilaterally   Urinary frequency    Vitamin D  deficiency    Xerostomia 05/26/2022   Past Surgical History:  Procedure Laterality Date   ABDOMINAL HYSTERECTOMY     BREATH TEK H PYLORI N/A 08/26/2014   Procedure: BREATH TEK H PYLORI;  Surgeon: Camellia Blush, MD;  Location: [facility];  Service: General;  Laterality: N/A;   IR RADIOLOGIST EVAL & MGMT  08/08/2022   KNEE SURGERY Left 2009   LAPAROSCOPIC GASTRIC SLEEVE RESECTION N/A 11/11/2014   Procedure: LAPAROSCOPIC GASTRIC SLEEVE RESECTION;  Surgeon: Camellia Blush, MD;  Location: [facility];  Service: General;  Laterality: N/A;   UPPER GI ENDOSCOPY  11/11/2014   Procedure: UPPER GI ENDOSCOPY;  Surgeon: Camellia Blush, MD;  Location: [facility];  Service: General;;   Social History   Socioeconomic History   Marital status: Married    Spouse name: Not on file   Number of children: 1   Years of education: 12   Highest education level: High school graduate  Occupational History   Occupation: Bus attendant  Tobacco Use   Smoking status: Never   Smokeless tobacco: Never  Vaping Use   Vaping status: Never Used  Substance and Sexual Activity   Alcohol use: No   Drug use: No   Sexual activity: Not on file  Other Topics Concern   Not on file  Social History Narrative   Not on file   Social Drivers of Health    Financial Resource Strain: Not on file  Food Insecurity: Not on file  Transportation Needs: Not on file  Physical Activity: Not on file  Stress: Not on file  Social Connections: Not on file  Intimate Partner Violence: Not on file   Current Outpatient Medications on File Prior to Visit  Medication Sig Dispense Refill   Accu-Chek Softclix Lancets lancets Use as instructed 3x a day 200 each 3   amLODipine  (NORVASC ) 10 MG tablet Take 10 mg by mouth every morning.     aspirin 81 MG chewable tablet Chew 81 mg by mouth daily.     atenolol  (TENORMIN ) 100 MG  tablet Take 100 mg by mouth every morning.      Blood Glucose Monitoring Suppl (ACCU-CHEK GUIDE) w/Device KIT Use as advised 1 kit 0   BRILINTA 90 MG TABS tablet Take 90 mg by mouth 2 (two) times daily.     busPIRone (BUSPAR) 10 MG tablet Take 10 mg by mouth daily as needed.     Cholecalciferol (VITAMIN D -3) 25 MCG (1000 UT) CAPS Take by mouth.     clindamycin (CLEOCIN) 150 MG capsule Take 150 mg by mouth 4 (four) times daily.     Continuous Glucose Sensor (FREESTYLE LIBRE 3 PLUS SENSOR) MISC Inject 1 Device into the skin continuous. Change every 15 days 6 each 3   ezetimibe  (ZETIA ) 10 MG tablet Take 10 mg by mouth at bedtime.     glucose blood (FREESTYLE LITE) test strip USE AS INSTRUCTED 3X A DAY 300 strip 12   hydrALAZINE (APRESOLINE) 100 MG tablet Take 100 mg by mouth 3 (three) times daily.     insulin  degludec (TRESIBA  FLEXTOUCH) 200 UNIT/ML FlexTouch Pen Inject 50 Units into the skin daily. 18 mL 3   insulin  lispro (ADMELOG  SOLOSTAR) 100 UNIT/ML KwikPen Inject 20-25 units into the skin 3 times a day 75 mL 2   insulin  lispro (ADMELOG ) 100 UNIT/ML injection Inject 20 to 25 units under skin 3 times a day before meals, as advised 60 mL 3   Insulin  Pen Needle (BD PEN NEEDLE MINI ULTRAFINE) 31G X 5 MM MISC USE 4 TIMES DAILY AS DIRECTED 400 each 12   JARDIANCE 10 MG TABS tablet Take 10 mg by mouth daily.     loperamide (IMODIUM) 2 MG  capsule Take 2 mg by mouth as needed for diarrhea or loose stools.     loratadine (CLARITIN) 10 MG tablet Take 10 mg by mouth daily.     losartan (COZAAR) 100 MG tablet Take 100 mg by mouth daily.     nitrofurantoin, macrocrystal-monohydrate, (MACROBID) 100 MG capsule Take 100 mg by mouth every 12 (twelve) hours.     ondansetron  (ZOFRAN  ODT) 4 MG disintegrating tablet Take 1 tablet (4 mg total) by mouth every 8 (eight) hours as needed for nausea or vomiting. 30 tablet 5   rosuvastatin (CRESTOR) 40 MG tablet Take 40 mg by mouth at bedtime.     tirzepatide  (MOUNJARO ) 5 MG/0.5ML Pen INJECT 5 MG SUBCUTANEOUSLY WEEKLY 2 mL 3   No current facility-administered medications on file prior to visit.   Allergies  Allergen Reactions   Cefdinir Diarrhea   Cephalexin Rash   Metoprolol Rash   Penicillins Itching and Rash   Sulfa Antibiotics Itching and Rash   Family History  Problem Relation Age of Onset   Diabetes Mother    Stroke Sister    Diabetes Sister    Stroke Brother    PE: There were no vitals taken for this visit. Wt Readings from Last 3 Encounters:  02/08/24 219 lb 12.8 oz (99.7 kg)  10/05/23 234 lb 3.2 oz (106.2 kg)  06/05/23 224 lb 3.2 oz (101.7 kg)   Constitutional: overweight, in NAD, signif. problem finding words Eyes: no exophthalmos ENT: no masses palpated in neck, no cervical lymphadenopathy Cardiovascular: RRR, No MRG Respiratory: CTA B Musculoskeletal: no deformities Skin:  no rashes Neurological: no tremor with outstretched hands  ASSESSMENT: 1. DM2, insulin -dependent, uncontrolled, with long-term complications - CVA - mild CKD - fatty liver - PN  2. HL  3.  Obesity class I  PLAN:  1. Patient with longstanding,  uncontrolled, type 2 diabetes, on basal/bolus insulin  regimen along with SGLT2 activator and weekly GLP-1/GIP receptor agonist, switched from Trulicity  to Mounjaro  at last visit.  At that time, she was not on her sensor and she brought a meter which  did not have the recorded time in day so it was difficult to know when the sugars were actually checked.  The sugars were fluctuating within 140 and 300s but with occasional values in the 400s and even HI.  She did have 1 low at 59.  I refilled her sensors and strongly advised her to start right away.  We also discussed about switching from Trulicity  to a stronger incretin mimetic, Mounjaro .  I also strongly advised her to inject Admelog  15 minutes before each meal, as she was actually bolusing too late, when the sugars were already high after meals.  We absolutely need to improve her blood sugars in the setting of worsening kidney function. -HbA1c at last visit was very high, at 9.4%, only slightly improved from 9.7%. CGM interpretation: -At today's visit, we reviewed her CGM downloads: It appears that *** of values are in target range (goal >70%), while *** are higher than 180 (goal <25%), and *** are lower than 70 (goal <4%).  The calculated average blood sugar is ***.  The projected HbA1c for the next 3 months (GMI) is ***. -Reviewing the CGM trends, ***  - I suggested to:  Patient Instructions  Please continue: - Jardiance 10 mg before b'fast - Tresiba  50 units daily - Admelog  15 min before each meal: - 25-30 units before b'fast  - 25-30 units before lunch - 25-30 units before dinner - Mounjaro  5 mg weekly  Try to start back on the sensor.  Please return in 1-1.5 month.  - advised to check sugars at different times of the day - 1x a day, rotating check times - advised for yearly eye exams >> she is UTD - return to clinic in 3-4 months  2. HL - Latest lipid panel was reviewed from 08/2023: At goal with the exception of an LDL above our target of less than 55: 144/90/53/73 - She continues on Crestor 40 mg daily without side effects  3. Obesity class I - will continue the SGLT2 inhibitor and GLP-1/GIP receptor agonist, which should also help with weight loss - I previously strongly  advised her to stop the [employer] coffee - She lost 15 pounds before last visit, previously gained 10. - At last visit I advised her to change from Trulicity  to Mounjaro   Lela Fendt, MD PhD [facility]

## 2024-03-26 ENCOUNTER — Ambulatory Visit: Admitting: Internal Medicine
